# Patient Record
Sex: Female | Born: 1950
Health system: Southern US, Community
[De-identification: ages and names within clinical notes are randomized; demographics above are authoritative.]

## PROBLEM LIST (undated history)

## (undated) DIAGNOSIS — E039 Hypothyroidism, unspecified: Secondary | ICD-10-CM

## (undated) DIAGNOSIS — M1712 Unilateral primary osteoarthritis, left knee: Secondary | ICD-10-CM

## (undated) DIAGNOSIS — E119 Type 2 diabetes mellitus without complications: Secondary | ICD-10-CM

## (undated) DIAGNOSIS — D62 Acute posthemorrhagic anemia: Secondary | ICD-10-CM

## (undated) DIAGNOSIS — E7800 Pure hypercholesterolemia, unspecified: Secondary | ICD-10-CM

## (undated) DIAGNOSIS — D638 Anemia in other chronic diseases classified elsewhere: Secondary | ICD-10-CM

## (undated) DIAGNOSIS — G4733 Obstructive sleep apnea (adult) (pediatric): Secondary | ICD-10-CM

## (undated) DIAGNOSIS — K7460 Unspecified cirrhosis of liver: Secondary | ICD-10-CM

## (undated) DIAGNOSIS — Z87442 Personal history of urinary calculi: Secondary | ICD-10-CM

## (undated) DIAGNOSIS — H16002 Unspecified corneal ulcer, left eye: Secondary | ICD-10-CM

## (undated) DIAGNOSIS — F419 Anxiety disorder, unspecified: Secondary | ICD-10-CM

## (undated) DIAGNOSIS — K219 Gastro-esophageal reflux disease without esophagitis: Secondary | ICD-10-CM

## (undated) DIAGNOSIS — I10 Essential (primary) hypertension: Secondary | ICD-10-CM

## (undated) HISTORY — DX: Obstructive sleep apnea (adult) (pediatric): G47.33

## (undated) HISTORY — DX: Unilateral primary osteoarthritis, left knee: M17.12

## (undated) HISTORY — DX: Unspecified corneal ulcer, left eye: H16.002

## (undated) HISTORY — DX: Gastro-esophageal reflux disease without esophagitis: K21.9

## (undated) HISTORY — DX: Type 2 diabetes mellitus without complications: E11.9

## (undated) HISTORY — DX: Essential (primary) hypertension: I10

## (undated) HISTORY — DX: Pure hypercholesterolemia, unspecified: E78.00

## (undated) HISTORY — DX: Hypothyroidism, unspecified: E03.9

## (undated) HISTORY — PX: EYE SURGERY: SHX253

## (undated) HISTORY — DX: Anxiety disorder, unspecified: F41.9

---

## 1994-11-04 HISTORY — PX: ABDOMINAL HYSTERECTOMY: SHX81

## 1995-11-05 HISTORY — PX: BLADDER SUSPENSION: SHX72

## 2002-11-04 HISTORY — PX: DACROCYSTORHINOSTOMY W/ JONES TUBE: SHX1436

## 2002-11-04 HISTORY — PX: CATARACT EXTRACTION W/ INTRAOCULAR LENS IMPLANT: SHX1309

## 2015-11-07 DIAGNOSIS — H16002 Unspecified corneal ulcer, left eye: Secondary | ICD-10-CM | POA: Diagnosis not present

## 2015-11-07 DIAGNOSIS — E1165 Type 2 diabetes mellitus with hyperglycemia: Secondary | ICD-10-CM | POA: Diagnosis not present

## 2015-11-07 DIAGNOSIS — Z418 Encounter for other procedures for purposes other than remedying health state: Secondary | ICD-10-CM | POA: Diagnosis not present

## 2015-11-07 DIAGNOSIS — E7800 Pure hypercholesterolemia, unspecified: Secondary | ICD-10-CM | POA: Diagnosis not present

## 2015-11-07 DIAGNOSIS — I10 Essential (primary) hypertension: Secondary | ICD-10-CM | POA: Diagnosis not present

## 2015-11-07 DIAGNOSIS — Z6830 Body mass index (BMI) 30.0-30.9, adult: Secondary | ICD-10-CM | POA: Diagnosis not present

## 2015-11-14 DIAGNOSIS — G4733 Obstructive sleep apnea (adult) (pediatric): Secondary | ICD-10-CM | POA: Diagnosis not present

## 2015-11-14 DIAGNOSIS — E7800 Pure hypercholesterolemia, unspecified: Secondary | ICD-10-CM | POA: Diagnosis not present

## 2015-11-14 DIAGNOSIS — E1165 Type 2 diabetes mellitus with hyperglycemia: Secondary | ICD-10-CM | POA: Diagnosis not present

## 2015-11-14 DIAGNOSIS — Z789 Other specified health status: Secondary | ICD-10-CM | POA: Diagnosis not present

## 2016-01-19 DIAGNOSIS — E1165 Type 2 diabetes mellitus with hyperglycemia: Secondary | ICD-10-CM | POA: Diagnosis not present

## 2016-01-19 DIAGNOSIS — E039 Hypothyroidism, unspecified: Secondary | ICD-10-CM | POA: Diagnosis not present

## 2016-01-19 DIAGNOSIS — Z1211 Encounter for screening for malignant neoplasm of colon: Secondary | ICD-10-CM | POA: Diagnosis not present

## 2016-01-19 DIAGNOSIS — Z299 Encounter for prophylactic measures, unspecified: Secondary | ICD-10-CM | POA: Diagnosis not present

## 2016-01-19 DIAGNOSIS — Z0000 Encounter for general adult medical examination without abnormal findings: Secondary | ICD-10-CM | POA: Diagnosis not present

## 2016-01-19 DIAGNOSIS — Z1389 Encounter for screening for other disorder: Secondary | ICD-10-CM | POA: Diagnosis not present

## 2016-01-19 DIAGNOSIS — Z6829 Body mass index (BMI) 29.0-29.9, adult: Secondary | ICD-10-CM | POA: Diagnosis not present

## 2016-01-19 DIAGNOSIS — Z7189 Other specified counseling: Secondary | ICD-10-CM | POA: Diagnosis not present

## 2016-01-19 DIAGNOSIS — E7800 Pure hypercholesterolemia, unspecified: Secondary | ICD-10-CM | POA: Diagnosis not present

## 2016-01-19 DIAGNOSIS — Z79899 Other long term (current) drug therapy: Secondary | ICD-10-CM | POA: Diagnosis not present

## 2016-02-05 DIAGNOSIS — Z1231 Encounter for screening mammogram for malignant neoplasm of breast: Secondary | ICD-10-CM | POA: Diagnosis not present

## 2016-02-06 DIAGNOSIS — E2839 Other primary ovarian failure: Secondary | ICD-10-CM | POA: Diagnosis not present

## 2016-02-19 DIAGNOSIS — M8580 Other specified disorders of bone density and structure, unspecified site: Secondary | ICD-10-CM | POA: Diagnosis not present

## 2016-02-19 DIAGNOSIS — E1165 Type 2 diabetes mellitus with hyperglycemia: Secondary | ICD-10-CM | POA: Diagnosis not present

## 2016-02-19 DIAGNOSIS — E7800 Pure hypercholesterolemia, unspecified: Secondary | ICD-10-CM | POA: Diagnosis not present

## 2016-02-19 DIAGNOSIS — I10 Essential (primary) hypertension: Secondary | ICD-10-CM | POA: Diagnosis not present

## 2016-02-27 DIAGNOSIS — I10 Essential (primary) hypertension: Secondary | ICD-10-CM | POA: Diagnosis not present

## 2016-02-27 DIAGNOSIS — E119 Type 2 diabetes mellitus without complications: Secondary | ICD-10-CM | POA: Diagnosis not present

## 2016-02-27 DIAGNOSIS — E7800 Pure hypercholesterolemia, unspecified: Secondary | ICD-10-CM | POA: Diagnosis not present

## 2016-03-15 DIAGNOSIS — E7800 Pure hypercholesterolemia, unspecified: Secondary | ICD-10-CM | POA: Diagnosis not present

## 2016-03-15 DIAGNOSIS — E119 Type 2 diabetes mellitus without complications: Secondary | ICD-10-CM | POA: Diagnosis not present

## 2016-03-15 DIAGNOSIS — I10 Essential (primary) hypertension: Secondary | ICD-10-CM | POA: Diagnosis not present

## 2016-04-02 DIAGNOSIS — Z789 Other specified health status: Secondary | ICD-10-CM | POA: Diagnosis not present

## 2016-04-02 DIAGNOSIS — Z299 Encounter for prophylactic measures, unspecified: Secondary | ICD-10-CM | POA: Diagnosis not present

## 2016-04-02 DIAGNOSIS — J069 Acute upper respiratory infection, unspecified: Secondary | ICD-10-CM | POA: Diagnosis not present

## 2016-04-24 DIAGNOSIS — E7800 Pure hypercholesterolemia, unspecified: Secondary | ICD-10-CM | POA: Diagnosis not present

## 2016-04-24 DIAGNOSIS — E119 Type 2 diabetes mellitus without complications: Secondary | ICD-10-CM | POA: Diagnosis not present

## 2016-04-24 DIAGNOSIS — I10 Essential (primary) hypertension: Secondary | ICD-10-CM | POA: Diagnosis not present

## 2016-04-26 DIAGNOSIS — E1165 Type 2 diabetes mellitus with hyperglycemia: Secondary | ICD-10-CM | POA: Diagnosis not present

## 2016-05-15 DIAGNOSIS — E7800 Pure hypercholesterolemia, unspecified: Secondary | ICD-10-CM | POA: Diagnosis not present

## 2016-05-15 DIAGNOSIS — I10 Essential (primary) hypertension: Secondary | ICD-10-CM | POA: Diagnosis not present

## 2016-05-15 DIAGNOSIS — E119 Type 2 diabetes mellitus without complications: Secondary | ICD-10-CM | POA: Diagnosis not present

## 2016-05-29 DIAGNOSIS — E119 Type 2 diabetes mellitus without complications: Secondary | ICD-10-CM | POA: Diagnosis not present

## 2016-05-29 DIAGNOSIS — H353131 Nonexudative age-related macular degeneration, bilateral, early dry stage: Secondary | ICD-10-CM | POA: Diagnosis not present

## 2016-05-29 DIAGNOSIS — H2511 Age-related nuclear cataract, right eye: Secondary | ICD-10-CM | POA: Diagnosis not present

## 2016-07-12 DIAGNOSIS — E7800 Pure hypercholesterolemia, unspecified: Secondary | ICD-10-CM | POA: Diagnosis not present

## 2016-07-12 DIAGNOSIS — E039 Hypothyroidism, unspecified: Secondary | ICD-10-CM | POA: Diagnosis not present

## 2016-07-12 DIAGNOSIS — I10 Essential (primary) hypertension: Secondary | ICD-10-CM | POA: Diagnosis not present

## 2016-07-12 DIAGNOSIS — E1165 Type 2 diabetes mellitus with hyperglycemia: Secondary | ICD-10-CM | POA: Diagnosis not present

## 2016-07-24 DIAGNOSIS — L820 Inflamed seborrheic keratosis: Secondary | ICD-10-CM | POA: Diagnosis not present

## 2016-07-24 DIAGNOSIS — L814 Other melanin hyperpigmentation: Secondary | ICD-10-CM | POA: Diagnosis not present

## 2016-07-24 DIAGNOSIS — L570 Actinic keratosis: Secondary | ICD-10-CM | POA: Diagnosis not present

## 2016-07-24 DIAGNOSIS — X32XXXA Exposure to sunlight, initial encounter: Secondary | ICD-10-CM | POA: Diagnosis not present

## 2016-07-24 DIAGNOSIS — D225 Melanocytic nevi of trunk: Secondary | ICD-10-CM | POA: Diagnosis not present

## 2016-07-24 DIAGNOSIS — Z1283 Encounter for screening for malignant neoplasm of skin: Secondary | ICD-10-CM | POA: Diagnosis not present

## 2016-07-24 DIAGNOSIS — C44519 Basal cell carcinoma of skin of other part of trunk: Secondary | ICD-10-CM | POA: Diagnosis not present

## 2016-07-24 DIAGNOSIS — D1801 Hemangioma of skin and subcutaneous tissue: Secondary | ICD-10-CM | POA: Diagnosis not present

## 2016-08-05 DIAGNOSIS — Z23 Encounter for immunization: Secondary | ICD-10-CM | POA: Diagnosis not present

## 2016-08-05 DIAGNOSIS — E1165 Type 2 diabetes mellitus with hyperglycemia: Secondary | ICD-10-CM | POA: Diagnosis not present

## 2016-08-05 DIAGNOSIS — R3911 Hesitancy of micturition: Secondary | ICD-10-CM | POA: Diagnosis not present

## 2016-08-05 DIAGNOSIS — H8110 Benign paroxysmal vertigo, unspecified ear: Secondary | ICD-10-CM | POA: Diagnosis not present

## 2016-08-05 DIAGNOSIS — Z6828 Body mass index (BMI) 28.0-28.9, adult: Secondary | ICD-10-CM | POA: Diagnosis not present

## 2016-08-05 DIAGNOSIS — Z299 Encounter for prophylactic measures, unspecified: Secondary | ICD-10-CM | POA: Diagnosis not present

## 2016-08-07 DIAGNOSIS — E119 Type 2 diabetes mellitus without complications: Secondary | ICD-10-CM | POA: Diagnosis not present

## 2016-08-07 DIAGNOSIS — E7800 Pure hypercholesterolemia, unspecified: Secondary | ICD-10-CM | POA: Diagnosis not present

## 2016-08-07 DIAGNOSIS — I10 Essential (primary) hypertension: Secondary | ICD-10-CM | POA: Diagnosis not present

## 2016-08-26 DIAGNOSIS — Z08 Encounter for follow-up examination after completed treatment for malignant neoplasm: Secondary | ICD-10-CM | POA: Diagnosis not present

## 2016-08-26 DIAGNOSIS — Z85828 Personal history of other malignant neoplasm of skin: Secondary | ICD-10-CM | POA: Diagnosis not present

## 2016-09-19 DIAGNOSIS — H16002 Unspecified corneal ulcer, left eye: Secondary | ICD-10-CM | POA: Diagnosis not present

## 2016-09-19 DIAGNOSIS — R350 Frequency of micturition: Secondary | ICD-10-CM | POA: Diagnosis not present

## 2016-09-19 DIAGNOSIS — Z299 Encounter for prophylactic measures, unspecified: Secondary | ICD-10-CM | POA: Diagnosis not present

## 2016-09-19 DIAGNOSIS — N390 Urinary tract infection, site not specified: Secondary | ICD-10-CM | POA: Diagnosis not present

## 2016-10-07 DIAGNOSIS — I10 Essential (primary) hypertension: Secondary | ICD-10-CM | POA: Diagnosis not present

## 2016-10-07 DIAGNOSIS — E119 Type 2 diabetes mellitus without complications: Secondary | ICD-10-CM | POA: Diagnosis not present

## 2016-10-07 DIAGNOSIS — E7800 Pure hypercholesterolemia, unspecified: Secondary | ICD-10-CM | POA: Diagnosis not present

## 2016-10-08 DIAGNOSIS — Z299 Encounter for prophylactic measures, unspecified: Secondary | ICD-10-CM | POA: Diagnosis not present

## 2016-10-08 DIAGNOSIS — Z6828 Body mass index (BMI) 28.0-28.9, adult: Secondary | ICD-10-CM | POA: Diagnosis not present

## 2016-10-08 DIAGNOSIS — I10 Essential (primary) hypertension: Secondary | ICD-10-CM | POA: Diagnosis not present

## 2016-10-08 DIAGNOSIS — Z713 Dietary counseling and surveillance: Secondary | ICD-10-CM | POA: Diagnosis not present

## 2016-11-05 DIAGNOSIS — Z713 Dietary counseling and surveillance: Secondary | ICD-10-CM | POA: Diagnosis not present

## 2016-11-05 DIAGNOSIS — I10 Essential (primary) hypertension: Secondary | ICD-10-CM | POA: Diagnosis not present

## 2016-11-05 DIAGNOSIS — Z6829 Body mass index (BMI) 29.0-29.9, adult: Secondary | ICD-10-CM | POA: Diagnosis not present

## 2016-11-05 DIAGNOSIS — Z299 Encounter for prophylactic measures, unspecified: Secondary | ICD-10-CM | POA: Diagnosis not present

## 2016-11-05 DIAGNOSIS — J069 Acute upper respiratory infection, unspecified: Secondary | ICD-10-CM | POA: Diagnosis not present

## 2016-11-11 DIAGNOSIS — Z789 Other specified health status: Secondary | ICD-10-CM | POA: Diagnosis not present

## 2016-11-11 DIAGNOSIS — Z6828 Body mass index (BMI) 28.0-28.9, adult: Secondary | ICD-10-CM | POA: Diagnosis not present

## 2016-11-11 DIAGNOSIS — R509 Fever, unspecified: Secondary | ICD-10-CM | POA: Diagnosis not present

## 2016-11-11 DIAGNOSIS — J111 Influenza due to unidentified influenza virus with other respiratory manifestations: Secondary | ICD-10-CM | POA: Diagnosis not present

## 2016-11-11 DIAGNOSIS — Z299 Encounter for prophylactic measures, unspecified: Secondary | ICD-10-CM | POA: Diagnosis not present

## 2016-11-15 DIAGNOSIS — E119 Type 2 diabetes mellitus without complications: Secondary | ICD-10-CM | POA: Diagnosis not present

## 2016-11-15 DIAGNOSIS — I10 Essential (primary) hypertension: Secondary | ICD-10-CM | POA: Diagnosis not present

## 2016-11-15 DIAGNOSIS — E7800 Pure hypercholesterolemia, unspecified: Secondary | ICD-10-CM | POA: Diagnosis not present

## 2016-11-18 DIAGNOSIS — R05 Cough: Secondary | ICD-10-CM | POA: Diagnosis not present

## 2016-11-18 DIAGNOSIS — Z713 Dietary counseling and surveillance: Secondary | ICD-10-CM | POA: Diagnosis not present

## 2016-11-18 DIAGNOSIS — E1165 Type 2 diabetes mellitus with hyperglycemia: Secondary | ICD-10-CM | POA: Diagnosis not present

## 2016-11-18 DIAGNOSIS — Z6828 Body mass index (BMI) 28.0-28.9, adult: Secondary | ICD-10-CM | POA: Diagnosis not present

## 2016-11-18 DIAGNOSIS — R5383 Other fatigue: Secondary | ICD-10-CM | POA: Diagnosis not present

## 2016-11-18 DIAGNOSIS — R509 Fever, unspecified: Secondary | ICD-10-CM | POA: Diagnosis not present

## 2016-11-18 DIAGNOSIS — Z299 Encounter for prophylactic measures, unspecified: Secondary | ICD-10-CM | POA: Diagnosis not present

## 2016-11-18 DIAGNOSIS — J069 Acute upper respiratory infection, unspecified: Secondary | ICD-10-CM | POA: Diagnosis not present

## 2016-11-18 DIAGNOSIS — E7800 Pure hypercholesterolemia, unspecified: Secondary | ICD-10-CM | POA: Diagnosis not present

## 2016-12-03 DIAGNOSIS — I10 Essential (primary) hypertension: Secondary | ICD-10-CM | POA: Diagnosis not present

## 2016-12-03 DIAGNOSIS — E1165 Type 2 diabetes mellitus with hyperglycemia: Secondary | ICD-10-CM | POA: Diagnosis not present

## 2016-12-03 DIAGNOSIS — Z299 Encounter for prophylactic measures, unspecified: Secondary | ICD-10-CM | POA: Diagnosis not present

## 2016-12-03 DIAGNOSIS — K219 Gastro-esophageal reflux disease without esophagitis: Secondary | ICD-10-CM | POA: Diagnosis not present

## 2016-12-03 DIAGNOSIS — E039 Hypothyroidism, unspecified: Secondary | ICD-10-CM | POA: Diagnosis not present

## 2016-12-03 DIAGNOSIS — Z6828 Body mass index (BMI) 28.0-28.9, adult: Secondary | ICD-10-CM | POA: Diagnosis not present

## 2016-12-03 DIAGNOSIS — E7800 Pure hypercholesterolemia, unspecified: Secondary | ICD-10-CM | POA: Diagnosis not present

## 2016-12-03 DIAGNOSIS — Z789 Other specified health status: Secondary | ICD-10-CM | POA: Diagnosis not present

## 2016-12-03 DIAGNOSIS — Z713 Dietary counseling and surveillance: Secondary | ICD-10-CM | POA: Diagnosis not present

## 2016-12-11 DIAGNOSIS — E119 Type 2 diabetes mellitus without complications: Secondary | ICD-10-CM | POA: Diagnosis not present

## 2016-12-11 DIAGNOSIS — I10 Essential (primary) hypertension: Secondary | ICD-10-CM | POA: Diagnosis not present

## 2016-12-11 DIAGNOSIS — E7800 Pure hypercholesterolemia, unspecified: Secondary | ICD-10-CM | POA: Diagnosis not present

## 2016-12-12 DIAGNOSIS — B0052 Herpesviral keratitis: Secondary | ICD-10-CM | POA: Diagnosis not present

## 2016-12-26 DIAGNOSIS — B0052 Herpesviral keratitis: Secondary | ICD-10-CM | POA: Diagnosis not present

## 2017-01-09 DIAGNOSIS — I10 Essential (primary) hypertension: Secondary | ICD-10-CM | POA: Diagnosis not present

## 2017-01-09 DIAGNOSIS — E119 Type 2 diabetes mellitus without complications: Secondary | ICD-10-CM | POA: Diagnosis not present

## 2017-01-09 DIAGNOSIS — E7800 Pure hypercholesterolemia, unspecified: Secondary | ICD-10-CM | POA: Diagnosis not present

## 2017-01-20 DIAGNOSIS — Z1211 Encounter for screening for malignant neoplasm of colon: Secondary | ICD-10-CM | POA: Diagnosis not present

## 2017-01-20 DIAGNOSIS — E039 Hypothyroidism, unspecified: Secondary | ICD-10-CM | POA: Diagnosis not present

## 2017-01-20 DIAGNOSIS — Z7189 Other specified counseling: Secondary | ICD-10-CM | POA: Diagnosis not present

## 2017-01-20 DIAGNOSIS — R5383 Other fatigue: Secondary | ICD-10-CM | POA: Diagnosis not present

## 2017-01-20 DIAGNOSIS — I10 Essential (primary) hypertension: Secondary | ICD-10-CM | POA: Diagnosis not present

## 2017-01-20 DIAGNOSIS — Z0000 Encounter for general adult medical examination without abnormal findings: Secondary | ICD-10-CM | POA: Diagnosis not present

## 2017-01-20 DIAGNOSIS — Z1389 Encounter for screening for other disorder: Secondary | ICD-10-CM | POA: Diagnosis not present

## 2017-01-20 DIAGNOSIS — Z79899 Other long term (current) drug therapy: Secondary | ICD-10-CM | POA: Diagnosis not present

## 2017-01-20 DIAGNOSIS — F419 Anxiety disorder, unspecified: Secondary | ICD-10-CM | POA: Diagnosis not present

## 2017-01-20 DIAGNOSIS — Z6828 Body mass index (BMI) 28.0-28.9, adult: Secondary | ICD-10-CM | POA: Diagnosis not present

## 2017-01-20 DIAGNOSIS — Z299 Encounter for prophylactic measures, unspecified: Secondary | ICD-10-CM | POA: Diagnosis not present

## 2017-01-20 DIAGNOSIS — E1165 Type 2 diabetes mellitus with hyperglycemia: Secondary | ICD-10-CM | POA: Diagnosis not present

## 2017-01-20 DIAGNOSIS — E7800 Pure hypercholesterolemia, unspecified: Secondary | ICD-10-CM | POA: Diagnosis not present

## 2017-01-23 DIAGNOSIS — H35132 Retinopathy of prematurity, stage 2, left eye: Secondary | ICD-10-CM | POA: Diagnosis not present

## 2017-01-23 DIAGNOSIS — B0052 Herpesviral keratitis: Secondary | ICD-10-CM | POA: Diagnosis not present

## 2017-02-07 DIAGNOSIS — Z1231 Encounter for screening mammogram for malignant neoplasm of breast: Secondary | ICD-10-CM | POA: Diagnosis not present

## 2017-02-14 DIAGNOSIS — I10 Essential (primary) hypertension: Secondary | ICD-10-CM | POA: Diagnosis not present

## 2017-02-14 DIAGNOSIS — H1712 Central corneal opacity, left eye: Secondary | ICD-10-CM | POA: Diagnosis not present

## 2017-02-14 DIAGNOSIS — E119 Type 2 diabetes mellitus without complications: Secondary | ICD-10-CM | POA: Diagnosis not present

## 2017-02-14 DIAGNOSIS — Z7984 Long term (current) use of oral hypoglycemic drugs: Secondary | ICD-10-CM | POA: Diagnosis not present

## 2017-02-14 DIAGNOSIS — H2511 Age-related nuclear cataract, right eye: Secondary | ICD-10-CM | POA: Diagnosis not present

## 2017-02-14 DIAGNOSIS — H35033 Hypertensive retinopathy, bilateral: Secondary | ICD-10-CM | POA: Diagnosis not present

## 2017-02-14 DIAGNOSIS — Z961 Presence of intraocular lens: Secondary | ICD-10-CM | POA: Diagnosis not present

## 2017-02-14 DIAGNOSIS — Z9842 Cataract extraction status, left eye: Secondary | ICD-10-CM | POA: Diagnosis not present

## 2017-02-14 DIAGNOSIS — H1045 Other chronic allergic conjunctivitis: Secondary | ICD-10-CM | POA: Diagnosis not present

## 2017-02-14 DIAGNOSIS — H524 Presbyopia: Secondary | ICD-10-CM | POA: Diagnosis not present

## 2017-02-19 DIAGNOSIS — E119 Type 2 diabetes mellitus without complications: Secondary | ICD-10-CM | POA: Diagnosis not present

## 2017-02-19 DIAGNOSIS — I10 Essential (primary) hypertension: Secondary | ICD-10-CM | POA: Diagnosis not present

## 2017-02-19 DIAGNOSIS — N6342 Unspecified lump in left breast, subareolar: Secondary | ICD-10-CM | POA: Diagnosis not present

## 2017-02-19 DIAGNOSIS — R921 Mammographic calcification found on diagnostic imaging of breast: Secondary | ICD-10-CM | POA: Diagnosis not present

## 2017-02-19 DIAGNOSIS — N6002 Solitary cyst of left breast: Secondary | ICD-10-CM | POA: Diagnosis not present

## 2017-02-19 DIAGNOSIS — E7800 Pure hypercholesterolemia, unspecified: Secondary | ICD-10-CM | POA: Diagnosis not present

## 2017-02-20 DIAGNOSIS — Z1283 Encounter for screening for malignant neoplasm of skin: Secondary | ICD-10-CM | POA: Diagnosis not present

## 2017-02-20 DIAGNOSIS — D225 Melanocytic nevi of trunk: Secondary | ICD-10-CM | POA: Diagnosis not present

## 2017-02-20 DIAGNOSIS — I781 Nevus, non-neoplastic: Secondary | ICD-10-CM | POA: Diagnosis not present

## 2017-02-20 DIAGNOSIS — X32XXXD Exposure to sunlight, subsequent encounter: Secondary | ICD-10-CM | POA: Diagnosis not present

## 2017-02-20 DIAGNOSIS — L570 Actinic keratosis: Secondary | ICD-10-CM | POA: Diagnosis not present

## 2017-03-05 DIAGNOSIS — E7800 Pure hypercholesterolemia, unspecified: Secondary | ICD-10-CM | POA: Diagnosis not present

## 2017-03-05 DIAGNOSIS — E119 Type 2 diabetes mellitus without complications: Secondary | ICD-10-CM | POA: Diagnosis not present

## 2017-03-05 DIAGNOSIS — I10 Essential (primary) hypertension: Secondary | ICD-10-CM | POA: Diagnosis not present

## 2017-03-10 DIAGNOSIS — E039 Hypothyroidism, unspecified: Secondary | ICD-10-CM | POA: Diagnosis not present

## 2017-03-10 DIAGNOSIS — E7800 Pure hypercholesterolemia, unspecified: Secondary | ICD-10-CM | POA: Diagnosis not present

## 2017-03-10 DIAGNOSIS — E1165 Type 2 diabetes mellitus with hyperglycemia: Secondary | ICD-10-CM | POA: Diagnosis not present

## 2017-03-10 DIAGNOSIS — Z6828 Body mass index (BMI) 28.0-28.9, adult: Secondary | ICD-10-CM | POA: Diagnosis not present

## 2017-03-10 DIAGNOSIS — K219 Gastro-esophageal reflux disease without esophagitis: Secondary | ICD-10-CM | POA: Diagnosis not present

## 2017-03-10 DIAGNOSIS — I10 Essential (primary) hypertension: Secondary | ICD-10-CM | POA: Diagnosis not present

## 2017-03-10 DIAGNOSIS — F419 Anxiety disorder, unspecified: Secondary | ICD-10-CM | POA: Diagnosis not present

## 2017-03-10 DIAGNOSIS — M8580 Other specified disorders of bone density and structure, unspecified site: Secondary | ICD-10-CM | POA: Diagnosis not present

## 2017-03-10 DIAGNOSIS — Z299 Encounter for prophylactic measures, unspecified: Secondary | ICD-10-CM | POA: Diagnosis not present

## 2017-03-10 DIAGNOSIS — H6690 Otitis media, unspecified, unspecified ear: Secondary | ICD-10-CM | POA: Diagnosis not present

## 2017-03-10 DIAGNOSIS — J302 Other seasonal allergic rhinitis: Secondary | ICD-10-CM | POA: Diagnosis not present

## 2017-04-24 DIAGNOSIS — E1165 Type 2 diabetes mellitus with hyperglycemia: Secondary | ICD-10-CM | POA: Diagnosis not present

## 2017-04-24 DIAGNOSIS — E039 Hypothyroidism, unspecified: Secondary | ICD-10-CM | POA: Diagnosis not present

## 2017-04-24 DIAGNOSIS — M7050 Other bursitis of knee, unspecified knee: Secondary | ICD-10-CM | POA: Diagnosis not present

## 2017-04-24 DIAGNOSIS — E7800 Pure hypercholesterolemia, unspecified: Secondary | ICD-10-CM | POA: Diagnosis not present

## 2017-04-24 DIAGNOSIS — Z299 Encounter for prophylactic measures, unspecified: Secondary | ICD-10-CM | POA: Diagnosis not present

## 2017-04-24 DIAGNOSIS — I10 Essential (primary) hypertension: Secondary | ICD-10-CM | POA: Diagnosis not present

## 2017-04-24 DIAGNOSIS — K219 Gastro-esophageal reflux disease without esophagitis: Secondary | ICD-10-CM | POA: Diagnosis not present

## 2017-04-24 DIAGNOSIS — M1712 Unilateral primary osteoarthritis, left knee: Secondary | ICD-10-CM | POA: Diagnosis not present

## 2017-04-24 DIAGNOSIS — Z6828 Body mass index (BMI) 28.0-28.9, adult: Secondary | ICD-10-CM | POA: Diagnosis not present

## 2017-04-24 DIAGNOSIS — G4733 Obstructive sleep apnea (adult) (pediatric): Secondary | ICD-10-CM | POA: Diagnosis not present

## 2017-05-01 DIAGNOSIS — B0052 Herpesviral keratitis: Secondary | ICD-10-CM | POA: Diagnosis not present

## 2017-05-01 DIAGNOSIS — H353132 Nonexudative age-related macular degeneration, bilateral, intermediate dry stage: Secondary | ICD-10-CM | POA: Diagnosis not present

## 2017-05-01 DIAGNOSIS — Z961 Presence of intraocular lens: Secondary | ICD-10-CM | POA: Diagnosis not present

## 2017-05-19 DIAGNOSIS — Z299 Encounter for prophylactic measures, unspecified: Secondary | ICD-10-CM | POA: Diagnosis not present

## 2017-05-19 DIAGNOSIS — M1712 Unilateral primary osteoarthritis, left knee: Secondary | ICD-10-CM | POA: Diagnosis not present

## 2017-05-19 DIAGNOSIS — Z6828 Body mass index (BMI) 28.0-28.9, adult: Secondary | ICD-10-CM | POA: Diagnosis not present

## 2017-05-22 DIAGNOSIS — S8002XA Contusion of left knee, initial encounter: Secondary | ICD-10-CM | POA: Diagnosis not present

## 2017-05-22 DIAGNOSIS — M1712 Unilateral primary osteoarthritis, left knee: Secondary | ICD-10-CM | POA: Diagnosis not present

## 2017-05-22 DIAGNOSIS — M810 Age-related osteoporosis without current pathological fracture: Secondary | ICD-10-CM | POA: Diagnosis not present

## 2017-06-03 DIAGNOSIS — M1711 Unilateral primary osteoarthritis, right knee: Secondary | ICD-10-CM | POA: Diagnosis not present

## 2017-06-03 DIAGNOSIS — S83242D Other tear of medial meniscus, current injury, left knee, subsequent encounter: Secondary | ICD-10-CM | POA: Diagnosis not present

## 2017-06-09 DIAGNOSIS — M25562 Pain in left knee: Secondary | ICD-10-CM | POA: Diagnosis not present

## 2017-06-09 DIAGNOSIS — S83262A Peripheral tear of lateral meniscus, current injury, left knee, initial encounter: Secondary | ICD-10-CM | POA: Diagnosis not present

## 2017-06-09 DIAGNOSIS — M1712 Unilateral primary osteoarthritis, left knee: Secondary | ICD-10-CM | POA: Diagnosis not present

## 2017-06-09 DIAGNOSIS — M23304 Other meniscus derangements, unspecified medial meniscus, left knee: Secondary | ICD-10-CM | POA: Diagnosis not present

## 2017-06-10 DIAGNOSIS — M1711 Unilateral primary osteoarthritis, right knee: Secondary | ICD-10-CM | POA: Diagnosis not present

## 2017-06-17 DIAGNOSIS — M1712 Unilateral primary osteoarthritis, left knee: Secondary | ICD-10-CM | POA: Diagnosis not present

## 2017-06-18 DIAGNOSIS — M79671 Pain in right foot: Secondary | ICD-10-CM | POA: Diagnosis not present

## 2017-06-18 DIAGNOSIS — I10 Essential (primary) hypertension: Secondary | ICD-10-CM | POA: Diagnosis not present

## 2017-06-18 DIAGNOSIS — M79672 Pain in left foot: Secondary | ICD-10-CM | POA: Diagnosis not present

## 2017-06-18 DIAGNOSIS — M8580 Other specified disorders of bone density and structure, unspecified site: Secondary | ICD-10-CM | POA: Diagnosis not present

## 2017-06-18 DIAGNOSIS — Z6828 Body mass index (BMI) 28.0-28.9, adult: Secondary | ICD-10-CM | POA: Diagnosis not present

## 2017-06-18 DIAGNOSIS — G4733 Obstructive sleep apnea (adult) (pediatric): Secondary | ICD-10-CM | POA: Diagnosis not present

## 2017-06-18 DIAGNOSIS — F419 Anxiety disorder, unspecified: Secondary | ICD-10-CM | POA: Diagnosis not present

## 2017-06-18 DIAGNOSIS — E1165 Type 2 diabetes mellitus with hyperglycemia: Secondary | ICD-10-CM | POA: Diagnosis not present

## 2017-06-18 DIAGNOSIS — Z299 Encounter for prophylactic measures, unspecified: Secondary | ICD-10-CM | POA: Diagnosis not present

## 2017-06-18 DIAGNOSIS — M1712 Unilateral primary osteoarthritis, left knee: Secondary | ICD-10-CM | POA: Diagnosis not present

## 2017-06-18 DIAGNOSIS — E039 Hypothyroidism, unspecified: Secondary | ICD-10-CM | POA: Diagnosis not present

## 2017-06-24 DIAGNOSIS — M1711 Unilateral primary osteoarthritis, right knee: Secondary | ICD-10-CM | POA: Diagnosis not present

## 2017-07-01 DIAGNOSIS — I10 Essential (primary) hypertension: Secondary | ICD-10-CM | POA: Diagnosis not present

## 2017-07-01 DIAGNOSIS — E7800 Pure hypercholesterolemia, unspecified: Secondary | ICD-10-CM | POA: Diagnosis not present

## 2017-07-01 DIAGNOSIS — E119 Type 2 diabetes mellitus without complications: Secondary | ICD-10-CM | POA: Diagnosis not present

## 2017-07-02 ENCOUNTER — Other Ambulatory Visit: Payer: Self-pay

## 2017-07-02 DIAGNOSIS — R0989 Other specified symptoms and signs involving the circulatory and respiratory systems: Secondary | ICD-10-CM

## 2017-07-08 DIAGNOSIS — M1711 Unilateral primary osteoarthritis, right knee: Secondary | ICD-10-CM | POA: Diagnosis not present

## 2017-07-16 DIAGNOSIS — I10 Essential (primary) hypertension: Secondary | ICD-10-CM | POA: Diagnosis not present

## 2017-07-16 DIAGNOSIS — E7800 Pure hypercholesterolemia, unspecified: Secondary | ICD-10-CM | POA: Diagnosis not present

## 2017-07-16 DIAGNOSIS — E119 Type 2 diabetes mellitus without complications: Secondary | ICD-10-CM | POA: Diagnosis not present

## 2017-07-17 ENCOUNTER — Encounter: Payer: Self-pay | Admitting: Vascular Surgery

## 2017-07-23 DIAGNOSIS — Z6828 Body mass index (BMI) 28.0-28.9, adult: Secondary | ICD-10-CM | POA: Diagnosis not present

## 2017-07-23 DIAGNOSIS — F419 Anxiety disorder, unspecified: Secondary | ICD-10-CM | POA: Diagnosis not present

## 2017-07-23 DIAGNOSIS — E039 Hypothyroidism, unspecified: Secondary | ICD-10-CM | POA: Diagnosis not present

## 2017-07-23 DIAGNOSIS — E1165 Type 2 diabetes mellitus with hyperglycemia: Secondary | ICD-10-CM | POA: Diagnosis not present

## 2017-07-23 DIAGNOSIS — G4733 Obstructive sleep apnea (adult) (pediatric): Secondary | ICD-10-CM | POA: Diagnosis not present

## 2017-07-23 DIAGNOSIS — M1712 Unilateral primary osteoarthritis, left knee: Secondary | ICD-10-CM | POA: Diagnosis not present

## 2017-07-23 DIAGNOSIS — E7800 Pure hypercholesterolemia, unspecified: Secondary | ICD-10-CM | POA: Diagnosis not present

## 2017-07-23 DIAGNOSIS — I10 Essential (primary) hypertension: Secondary | ICD-10-CM | POA: Diagnosis not present

## 2017-07-23 DIAGNOSIS — Z299 Encounter for prophylactic measures, unspecified: Secondary | ICD-10-CM | POA: Diagnosis not present

## 2017-07-25 DIAGNOSIS — Z23 Encounter for immunization: Secondary | ICD-10-CM | POA: Diagnosis not present

## 2017-07-28 DIAGNOSIS — Z713 Dietary counseling and surveillance: Secondary | ICD-10-CM | POA: Diagnosis not present

## 2017-07-28 DIAGNOSIS — I10 Essential (primary) hypertension: Secondary | ICD-10-CM | POA: Diagnosis not present

## 2017-07-28 DIAGNOSIS — Z299 Encounter for prophylactic measures, unspecified: Secondary | ICD-10-CM | POA: Diagnosis not present

## 2017-07-28 DIAGNOSIS — N390 Urinary tract infection, site not specified: Secondary | ICD-10-CM | POA: Diagnosis not present

## 2017-07-28 DIAGNOSIS — G4733 Obstructive sleep apnea (adult) (pediatric): Secondary | ICD-10-CM | POA: Diagnosis not present

## 2017-07-28 DIAGNOSIS — Z6828 Body mass index (BMI) 28.0-28.9, adult: Secondary | ICD-10-CM | POA: Diagnosis not present

## 2017-07-28 DIAGNOSIS — R350 Frequency of micturition: Secondary | ICD-10-CM | POA: Diagnosis not present

## 2017-08-01 ENCOUNTER — Other Ambulatory Visit: Payer: Self-pay | Admitting: Orthopedic Surgery

## 2017-08-05 DIAGNOSIS — Z299 Encounter for prophylactic measures, unspecified: Secondary | ICD-10-CM | POA: Diagnosis not present

## 2017-08-05 DIAGNOSIS — I10 Essential (primary) hypertension: Secondary | ICD-10-CM | POA: Diagnosis not present

## 2017-08-05 DIAGNOSIS — G4733 Obstructive sleep apnea (adult) (pediatric): Secondary | ICD-10-CM | POA: Diagnosis not present

## 2017-08-05 DIAGNOSIS — E039 Hypothyroidism, unspecified: Secondary | ICD-10-CM | POA: Diagnosis not present

## 2017-08-05 DIAGNOSIS — R3915 Urgency of urination: Secondary | ICD-10-CM | POA: Diagnosis not present

## 2017-08-05 DIAGNOSIS — E1165 Type 2 diabetes mellitus with hyperglycemia: Secondary | ICD-10-CM | POA: Diagnosis not present

## 2017-08-05 DIAGNOSIS — E7800 Pure hypercholesterolemia, unspecified: Secondary | ICD-10-CM | POA: Diagnosis not present

## 2017-08-05 DIAGNOSIS — Z6828 Body mass index (BMI) 28.0-28.9, adult: Secondary | ICD-10-CM | POA: Diagnosis not present

## 2017-08-05 DIAGNOSIS — M1712 Unilateral primary osteoarthritis, left knee: Secondary | ICD-10-CM | POA: Diagnosis not present

## 2017-08-05 DIAGNOSIS — F419 Anxiety disorder, unspecified: Secondary | ICD-10-CM | POA: Diagnosis not present

## 2017-08-06 ENCOUNTER — Encounter: Payer: Self-pay | Admitting: Physician Assistant

## 2017-08-06 DIAGNOSIS — G4733 Obstructive sleep apnea (adult) (pediatric): Secondary | ICD-10-CM | POA: Diagnosis present

## 2017-08-06 DIAGNOSIS — M1712 Unilateral primary osteoarthritis, left knee: Secondary | ICD-10-CM | POA: Diagnosis not present

## 2017-08-06 DIAGNOSIS — H16002 Unspecified corneal ulcer, left eye: Secondary | ICD-10-CM | POA: Diagnosis present

## 2017-08-06 DIAGNOSIS — E039 Hypothyroidism, unspecified: Secondary | ICD-10-CM | POA: Diagnosis present

## 2017-08-06 DIAGNOSIS — E119 Type 2 diabetes mellitus without complications: Secondary | ICD-10-CM

## 2017-08-06 DIAGNOSIS — F419 Anxiety disorder, unspecified: Secondary | ICD-10-CM | POA: Diagnosis present

## 2017-08-06 DIAGNOSIS — K219 Gastro-esophageal reflux disease without esophagitis: Secondary | ICD-10-CM | POA: Diagnosis present

## 2017-08-06 DIAGNOSIS — E7800 Pure hypercholesterolemia, unspecified: Secondary | ICD-10-CM | POA: Diagnosis present

## 2017-08-06 DIAGNOSIS — I10 Essential (primary) hypertension: Secondary | ICD-10-CM | POA: Diagnosis present

## 2017-08-06 HISTORY — DX: Unilateral primary osteoarthritis, left knee: M17.12

## 2017-08-06 NOTE — H&P (Signed)
TOTAL KNEE ADMISSION H&P  Patient is being admitted for left total knee arthroplasty.  Subjective:  Chief Complaint:left knee pain.  HPI: Katherine Bell, 66 y.o. female, has a history of pain and functional disability in the left knee due to arthritis and has failed non-surgical conservative treatments for greater than 12 weeks to includeNSAID's and/or analgesics, corticosteriod injections, viscosupplementation injections, flexibility and strengthening excercises, supervised PT with diminished ADL's post treatment, use of assistive devices, weight reduction as appropriate and activity modification.  Onset of symptoms was gradual, starting 10 years ago with gradually worsening course since that time. The patient noted no past surgery on the left knee(s).  Patient currently rates pain in the left knee(s) at 10 out of 10 with activity. Patient has night pain, worsening of pain with activity and weight bearing, pain that interferes with activities of daily living, crepitus and joint swelling.  Patient has evidence of subchondral sclerosis, periarticular osteophytes and joint space narrowing by imaging studies. There is no active infection.  Patient Active Problem List   Diagnosis Date Noted  . Primary localized osteoarthrosis of the knee, left 08/06/2017  . Sleep apnea, obstructive   . Osteoarthritis of left knee   . Hypothyroidism   . Hypertension   . Hypercholesteremia   . GERD (gastroesophageal reflux disease)   . Diabetes mellitus without complication ([place])   . Corneal ulcer, left   . Anxiety    Past Medical History:  Diagnosis Date  . Anxiety   . Corneal ulcer, left   . Diabetes mellitus without complication ([place])   . GERD (gastroesophageal reflux disease)   . Hypercholesteremia   . Hypertension   . Hypothyroidism   . Osteoarthritis of left knee   . Primary localized osteoarthrosis of the knee, left 08/06/2017  . Sleep apnea, obstructive     Past Surgical History:  Procedure  Laterality Date  . ABDOMINAL HYSTERECTOMY  1996  . BLADDER SUSPENSION  1997   3 different procedures  . CATARACT EXTRACTION W/ INTRAOCULAR LENS IMPLANT Left 2004  . DACROCYSTORHINOSTOMY W/ JONES TUBE Left 2004    No current facility-administered medications for this encounter.   Current Outpatient Prescriptions:  .  acyclovir (ZOVIRAX) 400 MG tablet, Take 400 mg by mouth 2 (two) times daily., Disp: , Rfl:  .  aspirin 81 MG chewable tablet, Chew 81 mg by mouth daily. , Disp: , Rfl:  .  atorvastatin (LIPITOR) 40 MG tablet, Take 20 mg by mouth daily. , Disp: , Rfl:  .  Calcium Carbonate (CALCIUM 600 PO), Take 1,200 mg by mouth daily. , Disp: , Rfl:  .  fexofenadine (ALLEGRA) 180 MG tablet, Take 180 mg by mouth daily as needed for allergies. , Disp: , Rfl:  .  glipiZIDE (GLUCOTROL) 5 MG tablet, Take 5 mg by mouth 2 (two) times daily before a meal., Disp: , Rfl:  .  hydrochlorothiazide (HYDRODIURIL) 25 MG tablet, Take 25 mg by mouth daily., Disp: , Rfl:  .  levothyroxine (SYNTHROID, LEVOTHROID) 100 MCG tablet, Take 100 mcg by mouth daily before breakfast., Disp: , Rfl:  .  lisinopril (PRINIVIL,ZESTRIL) 5 MG tablet, Take 5 mg by mouth daily. , Disp: , Rfl:  .  losartan (COZAAR) 25 MG tablet, Take 25 mg by mouth daily. , Disp: , Rfl:  .  metFORMIN (GLUCOPHAGE) 1000 MG tablet, Take 1,000 mg by mouth 2 (two) times daily with a meal., Disp: , Rfl:  .  Multiple Vitamins-Minerals (CENTRUM SILVER ULTRA WOMENS PO), Take 0.5 tablets  by mouth 2 (two) times daily. , Disp: , Rfl:  .  pantoprazole (PROTONIX) 40 MG tablet, Take 40 mg by mouth 2 (two) times daily., Disp: , Rfl:  .  sertraline (ZOLOFT) 100 MG tablet, Take 100 mg by mouth 2 (two) times daily., Disp: , Rfl:  .  VOLTAREN 1 % GEL, Apply 1 application topically 3 (three) times daily., Disp: , Rfl: 5  Allergies  Allergen Reactions  . Penicillins Hives    Has taken keflex without difficulty       . Sulfa Antibiotics Hives    Social History   Substance Use Topics  . Smoking status: Never Smoker  . Smokeless tobacco: Not on file  . Alcohol use No    Family History  Problem Relation Age of Onset  . Suicidality Father   . Diabetes Brother   . Hypertension Brother   . Heart disease Brother      Review of Systems  Constitutional: Negative.   HENT: Negative.   Eyes: Negative.   Respiratory: Negative.   Cardiovascular: Negative.   Gastrointestinal: Negative.   Genitourinary: Negative.   Musculoskeletal: Positive for back pain and joint pain.  Skin: Negative.   Neurological: Negative.   Endo/Heme/Allergies: Negative.   Psychiatric/Behavioral: Negative.     Objective:  Physical Exam  Constitutional: She is oriented to person, place, and time. She appears well-developed and well-nourished.  HENT:  Head: Normocephalic and atraumatic.  Mouth/Throat: Oropharynx is clear and moist.  Eyes: Pupils are equal, round, and reactive to light. Conjunctivae are normal.  Neck: Neck supple.  Cardiovascular: Normal rate and regular rhythm.   Respiratory: Effort normal.  GI: Soft.  Genitourinary:  Genitourinary Comments: Not pertinent to current symptomatology therefore not examined.  Musculoskeletal:  Examination reveals that her knee is relatively locked from a position of 10 degrees of flexion to about 40 degrees of flexion.  Some posteromedial joint line tenderness.  Some positive patellar apprehension.  No warmth or erythema.  1-2+ swelling.   Right knee has range of motion within normal limits minimal pain.  Neurological: She is alert and oriented to person, place, and time.  Skin: Skin is warm and dry.  Psychiatric: She has a normal mood and affect. Her behavior is normal.    Vital signs in last 24 hours: Temp:  [98.2 F (36.8 C)] 98.2 F (36.8 C) (10/03 1500) Pulse Rate:  [97] 97 (10/03 1500) BP: (146)/(82) 146/82 (10/03 1500) SpO2:  [98 %] 98 % (10/03 1500) Weight:  [84.8 kg (187 lb)] 84.8 kg (187 lb) (10/03  1500)  Labs:   Estimated body mass index is 29.29 kg/m as calculated from the following:   Height as of this encounter: 5\' 7"  (1.702 m).   Weight as of this encounter: 84.8 kg (187 lb).   Imaging Review Plain radiographs demonstrate severe degenerative joint disease of the left knee(s). The overall alignment issignificant varus. The bone quality appears to be good for age and reported activity level.  Assessment/Plan:  End stage arthritis, left knee  Principal Problem:   Primary localized osteoarthrosis of the knee, left Active Problems:   Sleep apnea, obstructive   Osteoarthritis of left knee   Hypothyroidism   Hypertension   Hypercholesteremia   GERD (gastroesophageal reflux disease)   Diabetes mellitus without complication (HCC)   Corneal ulcer, left   Anxiety  The patient history, physical examination, clinical judgment of the provider and imaging studies are consistent with end stage degenerative joint disease of the left knee(s)  and total knee arthroplasty is deemed medically necessary. The treatment options including medical management, injection therapy arthroscopy and arthroplasty were discussed at length. The risks and benefits of total knee arthroplasty were presented and reviewed. The risks due to aseptic loosening, infection, stiffness, patella tracking problems, thromboembolic complications and other imponderables were discussed. The patient acknowledged the explanation, agreed to proceed with the plan and consent was signed. Patient is being admitted for inpatient treatment for surgery, pain control, PT, OT, prophylactic antibiotics, VTE prophylaxis, progressive ambulation and ADL's and discharge planning. The patient is planning to be discharged home with home health services

## 2017-08-07 NOTE — Pre-Procedure Instructions (Signed)
Katherine Bell  08/07/2017      [id number] [place] [place] [address] [place] [address] Phone: [phone] Fax: [phone]    Your procedure is scheduled on Monday, August 18, 2017  Report to [facility] Admitting Entrance "A" at 7:45 A.M.  Call this number if you have problems the morning of surgery:  [phone]   Remember:  Do not eat food or drink liquids after midnight.  Take these medicines the morning of surgery with A SIP OF WATER: Acyclovir (ZOVIRAX), Levothyroxine (SYNTHROID, LEVOTHROID), Pantoprazole (PROTONIX), and Sertraline (ZOLOFT). If needed Fexofenadine [facility]) for allergies.  Follow your doctor's instruction for taking Aspirin.  7 days before surgery (Oct. 8) stop taking all Aspirin Products, Vitamins, Fish oils, and Herbal medications. Also stop all NSAIDS i.e. Advil, Motrin, Aleve, Anaprox, Naproxen, BC and Goody Powders.  How to Manage Your Diabetes Before and After Surgery  Why is it important to control my blood sugar before and after surgery? . Improving blood sugar levels before and after surgery helps healing and can limit problems. . A way of improving blood sugar control is eating a healthy diet by: o  Eating less sugar and carbohydrates o  Increasing activity/exercise o  Talking with your doctor about reaching your blood sugar goals . High blood sugars (greater than 180 mg/dL) can raise your risk of infections and slow your recovery, so you will need to focus on controlling your diabetes during the weeks before surgery. . Make sure that the doctor who takes care of your diabetes knows about your planned surgery including the date and location.  How do I manage my blood sugar before surgery? . Check your blood sugar at least 4 times a day, starting 2 days before surgery, to make sure that the level is not too high or low. o Check your blood sugar the morning of  your surgery when you wake up and every 2 hours until you get to the Short Stay unit. . If your blood sugar is less than 70 mg/dL, you will need to treat for low blood sugar: o Do not take insulin. o Treat a low blood sugar (less than 70 mg/dL) with  cup of clear juice (cranberry or apple), 4 glucose tablets, OR glucose gel. o Recheck blood sugar in 15 minutes after treatment (to make sure it is greater than 70 mg/dL). If your blood sugar is not greater than 70 mg/dL on recheck, call [phone] for further instructions. . Report your blood sugar to the short stay nurse when you get to Short Stay.  . If you are admitted to the hospital after surgery: o Your blood sugar will be checked by the staff and you will probably be given insulin after surgery (instead of oral diabetes medicines) to make sure you have good blood sugar levels. o The goal for blood sugar control after surgery is 80-180 mg/dL.  WHAT DO I DO ABOUT MY DIABETES MEDICATION?  Marland Kitchen Do not take GlipiZIDE (GLUCOTROL) and MetFORMIN (GLUCOPHAGE) the morning of surgery.        If your CBG is greater than 220 mg/dL, call the unit [phone]   Do not wear jewelry, make-up or nail polish.  Do not wear lotions, powders, or perfumes, or deodorant.  Do not shave 48 hours prior to surgery.   Do not bring valuables to the hospital.  [facility] is not responsible for  any belongings or valuables.  Contacts, dentures or bridgework may not be worn into surgery.  Leave your suitcase in the car.  After surgery it may be brought to your room.  For patients admitted to the hospital, discharge time will be determined by your treatment team.  Patients discharged the day of surgery will not be allowed to drive home.   Special instructions:  [place]- Preparing For Surgery  Before surgery, you can play an important role. Because skin is not sterile, your skin needs to be as free of germs as possible. You can reduce the number of germs on  your skin by washing with CHG (chlorahexidine gluconate) Soap before surgery.  CHG is an antiseptic cleaner which kills germs and bonds with the skin to continue killing germs even after washing.  Please do not use if you have an allergy to CHG or antibacterial soaps. If your skin becomes reddened/irritated stop using the CHG.  Do not shave (including legs and underarms) for at least 48 hours prior to first CHG shower. It is OK to shave your face.  Please follow these instructions carefully.   1. Shower the NIGHT BEFORE SURGERY and the MORNING OF SURGERY with CHG.   2. If you chose to wash your hair, wash your hair first as usual with your normal shampoo.  3. After you shampoo, rinse your hair and body thoroughly to remove the shampoo.  4. Use CHG as you would any other liquid soap. You can apply CHG directly to the skin and wash gently with a scrungie or a clean washcloth.   5. Apply the CHG Soap to your body ONLY FROM THE NECK DOWN.  Do not use on open wounds or open sores. Avoid contact with your eyes, ears, mouth and genitals (private parts). Wash genitals (private parts) with your normal soap.  USE REGULAR SHAMPOO AND CONDITIONER FOR HAIR USE REGULAR SOAP FOR FACE AND PRIVATE AREA  6. Wash thoroughly, paying special attention to the area where your surgery will be performed.  7. Thoroughly rinse your body with warm water from the neck down.  8. DO NOT shower/wash with your normal soap after using and rinsing off the CHG Soap.  9. Pat yourself dry with a CLEAN TOWEL and [place] CLOTH  10. Wear CLEAN PAJAMAS to bed the night before surgery, wear comfortable clothes the morning of surgery  11. Place CLEAN SHEETS on your bed the night of your first shower and DO NOT SLEEP WITH PETS.  Day of Surgery: Do not apply any deodorants/lotions. Please wear clean clothes to the hospital/surgery center.    Please read over the following fact sheets that you were given. Pain Booklet, Coughing  and Deep Breathing, Blood Transfusion Information, MRSA Information and Surgical Site Infection Prevention

## 2017-08-08 ENCOUNTER — Encounter (HOSPITAL_COMMUNITY): Payer: Self-pay | Admitting: *Deleted

## 2017-08-08 ENCOUNTER — Encounter (HOSPITAL_COMMUNITY)
Admission: RE | Admit: 2017-08-08 | Discharge: 2017-08-08 | Disposition: A | Discharge status: Home / Self Care | Payer: Medicare Other | Source: Ambulatory Visit | Attending: Orthopedic Surgery | Admitting: Orthopedic Surgery

## 2017-08-08 DIAGNOSIS — Z01812 Encounter for preprocedural laboratory examination: Secondary | ICD-10-CM | POA: Insufficient documentation

## 2017-08-08 DIAGNOSIS — Z01810 Encounter for preprocedural cardiovascular examination: Secondary | ICD-10-CM | POA: Diagnosis not present

## 2017-08-08 DIAGNOSIS — M1712 Unilateral primary osteoarthritis, left knee: Secondary | ICD-10-CM | POA: Insufficient documentation

## 2017-08-08 HISTORY — DX: Personal history of urinary calculi: Z87.442

## 2017-08-08 LAB — BASIC METABOLIC PANEL
Anion gap: 10 (ref 5–15)
BUN: 11 mg/dL (ref 6–20)
CALCIUM: 9 mg/dL (ref 8.9–10.3)
CO2: 26 mmol/L (ref 22–32)
CREATININE: 1.01 mg/dL — AB (ref 0.44–1.00)
Chloride: 101 mmol/L (ref 101–111)
GFR calc non Af Amer: 56 mL/min — ABNORMAL LOW (ref >60)
Glucose, Bld: 220 mg/dL — ABNORMAL HIGH (ref 65–99)
Potassium: 3.8 mmol/L (ref 3.5–5.1)
SODIUM: 137 mmol/L (ref 135–145)

## 2017-08-08 LAB — CBC
HCT: 36.9 % (ref 36.0–46.0)
Hemoglobin: 11.9 g/dL — ABNORMAL LOW (ref 12.0–15.0)
MCH: 24 pg — ABNORMAL LOW (ref 26.0–34.0)
MCHC: 32.2 g/dL (ref 30.0–36.0)
MCV: 74.5 fL — ABNORMAL LOW (ref 78.0–100.0)
PLATELETS: 122 10*3/uL — AB (ref 150–400)
RBC: 4.95 MIL/uL (ref 3.87–5.11)
RDW: 15 % (ref 11.5–15.5)
WBC: 4.4 10*3/uL (ref 4.0–10.5)

## 2017-08-08 LAB — HEMOGLOBIN A1C
HEMOGLOBIN A1C: 6.1 % — AB (ref 4.8–5.6)
MEAN PLASMA GLUCOSE: 128.37 mg/dL

## 2017-08-08 LAB — SURGICAL PCR SCREEN
MRSA, PCR: NEGATIVE
Staphylococcus aureus: NEGATIVE

## 2017-08-08 LAB — GLUCOSE, CAPILLARY: GLUCOSE-CAPILLARY: 203 mg/dL — AB (ref 65–99)

## 2017-08-08 LAB — ABO/RH: ABO/RH(D): B POS

## 2017-08-08 NOTE — Pre-Procedure Instructions (Addendum)
Katherine Bell  08/08/2017      [id number] [place], [place] [address] [place] [address] Phone: [phone] Fax: [phone]    Your procedure is scheduled on Monday, August 18, 2017  Report to [facility] Admitting Entrance "A" at 7:45 A.M.  Call this number if you have problems the morning of surgery:  [phone]   Remember:  Do not eat food or drink liquids after midnight.  Take these medicines the morning of surgery with A SIP OF WATER: Acyclovir (ZOVIRAX), Levothyroxine (SYNTHROID, LEVOTHROID), Pantoprazole (PROTONIX), and Sertraline (ZOLOFT). If needed Fexofenadine [facility]) for allergies.   08/11/17  stop taking all aspirin ,Aspirin Products, all Vitamins, Fish oils, and Herbal medications. Also stop all NSAIDS i.e. Advil, Motrin, Aleve, Anaprox, Naproxen, BC and Goody Powders.,voltaren gel  No diabetic meds am of surgery  How to Manage Your Diabetes Before and After Surgery  Why is it important to control my blood sugar before and after surgery? . Improving blood sugar levels before and after surgery helps healing and can limit problems. . A way of improving blood sugar control is eating a healthy diet by: o  Eating less sugar and carbohydrates o  Increasing activity/exercise o  Talking with your doctor about reaching your blood sugar goals . High blood sugars (greater than 180 mg/dL) can raise your risk of infections and slow your recovery, so you will need to focus on controlling your diabetes during the weeks before surgery. . Make sure that the doctor who takes care of your diabetes knows about your planned surgery including the date and location.  How do I manage my blood sugar before surgery? . Check your blood sugar at least 4 times a day, starting 2 days before surgery, to make sure that the level is not too high or low. o Check your blood sugar the morning of your surgery when  you wake up and every 2 hours until you get to the Short Stay unit. . If your blood sugar is less than 70 mg/dL, you will need to treat for low blood sugar: o Do not take insulin. o Treat a low blood sugar (less than 70 mg/dL) with  cup of clear juice (cranberry or apple), 4 glucose tablets, OR glucose gel. o Recheck blood sugar in 15 minutes after treatment (to make sure it is greater than 70 mg/dL). If your blood sugar is not greater than 70 mg/dL on recheck, call [phone] for further instructions. . Report your blood sugar to the short stay nurse when you get to Short Stay.  . If you are admitted to the hospital after surgery: o Your blood sugar will be checked by the staff and you will probably be given insulin after surgery (instead of oral diabetes medicines) to make sure you have good blood sugar levels. o The goal for blood sugar control after surgery is 80-180 mg/dL.  WHAT DO I DO ABOUT MY DIABETES MEDICATION?  Marland Kitchen Do not take GlipiZIDE (GLUCOTROL) or MetFORMIN (GLUCOPHAGE) the morning of surgery.        If your CBG is greater than 220 mg/dL, call the unit [phone]   Do not wear jewelry, make-up or nail polish.  Do not wear lotions, powders, or perfumes, or deodorant.  Do not shave 48 hours prior to surgery.   Do not bring valuables to the hospital.  [facility] is not responsible for any  belongings or valuables.  Contacts, dentures or bridgework may not be worn into surgery.  Leave your suitcase in the car.  After surgery it may be brought to your room.  For patients admitted to the hospital, discharge time will be determined by your treatment team.  Patients discharged the day of surgery will not be allowed to drive home.   Special instructions:  [place]- Preparing For Surgery  Before surgery, you can play an important role. Because skin is not sterile, your skin needs to be as free of germs as possible. You can reduce the number of germs on your skin by washing  with CHG (chlorahexidine gluconate) Soap before surgery.  CHG is an antiseptic cleaner which kills germs and bonds with the skin to continue killing germs even after washing.  Please do not use if you have an allergy to CHG or antibacterial soaps. If your skin becomes reddened/irritated stop using the CHG.  Do not shave (including legs and underarms) for at least 48 hours prior to first CHG shower. It is OK to shave your face.  Please follow these instructions carefully.   1. Shower the NIGHT BEFORE SURGERY and the MORNING OF SURGERY with CHG.   2. If you chose to wash your hair, wash your hair first as usual with your normal shampoo.  3. After you shampoo, rinse your hair and body thoroughly to remove the shampoo.  4. Use CHG as you would any other liquid soap. You can apply CHG directly to the skin and wash gently with a scrungie or a clean washcloth.   5. Apply the CHG Soap to your body ONLY FROM THE NECK DOWN.  Do not use on open wounds or open sores. Avoid contact with your eyes, ears, mouth and genitals (private parts). Wash genitals (private parts) with your normal soap.  USE REGULAR SHAMPOO AND CONDITIONER FOR HAIR USE REGULAR SOAP FOR FACE AND PRIVATE AREA  6. Wash thoroughly, paying special attention to the area where your surgery will be performed.  7. Thoroughly rinse your body with warm water from the neck down.  8. DO NOT shower/wash with your normal soap after using and rinsing off the CHG Soap.  9. Pat yourself dry with a CLEAN TOWEL and [place] CLOTH  10. Wear CLEAN PAJAMAS to bed the night before surgery, wear comfortable clothes the morning of surgery  11. Place CLEAN SHEETS on your bed the night of your first shower and DO NOT SLEEP WITH PETS.  Day of Surgery: Do not apply any deodorants/lotions. Please wear clean clothes to the hospital/surgery center.    Please read over the  fact sheets that you were given.

## 2017-08-12 DIAGNOSIS — E119 Type 2 diabetes mellitus without complications: Secondary | ICD-10-CM | POA: Diagnosis not present

## 2017-08-12 DIAGNOSIS — E7800 Pure hypercholesterolemia, unspecified: Secondary | ICD-10-CM | POA: Diagnosis not present

## 2017-08-12 DIAGNOSIS — I10 Essential (primary) hypertension: Secondary | ICD-10-CM | POA: Diagnosis not present

## 2017-08-13 ENCOUNTER — Encounter: Payer: Medicare Other | Admitting: Vascular Surgery

## 2017-08-13 ENCOUNTER — Encounter (HOSPITAL_COMMUNITY): Payer: Medicare Other

## 2017-08-15 ENCOUNTER — Encounter (HOSPITAL_COMMUNITY): Payer: Self-pay | Admitting: Certified Registered"

## 2017-08-15 MED ORDER — VANCOMYCIN HCL IN DEXTROSE 1-5 GM/200ML-% IV SOLN
1000.0000 mg | INTRAVENOUS | Status: AC
  Administered 2017-08-18: 1000 mg via INTRAVENOUS
  Filled 2017-08-15: qty 200

## 2017-08-18 ENCOUNTER — Inpatient Hospital Stay (HOSPITAL_COMMUNITY): Payer: Medicare Other | Admitting: Certified Registered"

## 2017-08-18 ENCOUNTER — Encounter (HOSPITAL_COMMUNITY)
Admission: RE | Disposition: A | Discharge status: Home Health | Payer: Self-pay | Source: Ambulatory Visit | Attending: Orthopedic Surgery

## 2017-08-18 ENCOUNTER — Inpatient Hospital Stay (HOSPITAL_COMMUNITY)
Admission: RE | Admit: 2017-08-18 | Discharge: 2017-08-26 | DRG: 470 | Disposition: A | Discharge status: Home Health | Payer: Medicare Other | Source: Ambulatory Visit | Attending: Orthopedic Surgery | Admitting: Orthopedic Surgery

## 2017-08-18 ENCOUNTER — Encounter (HOSPITAL_COMMUNITY): Payer: Self-pay | Admitting: *Deleted

## 2017-08-18 DIAGNOSIS — I10 Essential (primary) hypertension: Secondary | ICD-10-CM | POA: Diagnosis present

## 2017-08-18 DIAGNOSIS — Z961 Presence of intraocular lens: Secondary | ICD-10-CM | POA: Diagnosis present

## 2017-08-18 DIAGNOSIS — R7989 Other specified abnormal findings of blood chemistry: Secondary | ICD-10-CM | POA: Diagnosis not present

## 2017-08-18 DIAGNOSIS — D62 Acute posthemorrhagic anemia: Secondary | ICD-10-CM | POA: Diagnosis not present

## 2017-08-18 DIAGNOSIS — M1712 Unilateral primary osteoarthritis, left knee: Secondary | ICD-10-CM | POA: Diagnosis not present

## 2017-08-18 DIAGNOSIS — D61818 Other pancytopenia: Secondary | ICD-10-CM | POA: Diagnosis not present

## 2017-08-18 DIAGNOSIS — B0052 Herpesviral keratitis: Secondary | ICD-10-CM | POA: Diagnosis present

## 2017-08-18 DIAGNOSIS — E6609 Other obesity due to excess calories: Secondary | ICD-10-CM | POA: Diagnosis present

## 2017-08-18 DIAGNOSIS — H16002 Unspecified corneal ulcer, left eye: Secondary | ICD-10-CM | POA: Diagnosis present

## 2017-08-18 DIAGNOSIS — Z882 Allergy status to sulfonamides status: Secondary | ICD-10-CM

## 2017-08-18 DIAGNOSIS — D696 Thrombocytopenia, unspecified: Secondary | ICD-10-CM | POA: Diagnosis not present

## 2017-08-18 DIAGNOSIS — Z7984 Long term (current) use of oral hypoglycemic drugs: Secondary | ICD-10-CM | POA: Diagnosis not present

## 2017-08-18 DIAGNOSIS — E7800 Pure hypercholesterolemia, unspecified: Secondary | ICD-10-CM | POA: Diagnosis present

## 2017-08-18 DIAGNOSIS — E039 Hypothyroidism, unspecified: Secondary | ICD-10-CM | POA: Diagnosis present

## 2017-08-18 DIAGNOSIS — F419 Anxiety disorder, unspecified: Secondary | ICD-10-CM | POA: Diagnosis present

## 2017-08-18 DIAGNOSIS — M25762 Osteophyte, left knee: Secondary | ICD-10-CM | POA: Diagnosis present

## 2017-08-18 DIAGNOSIS — M21162 Varus deformity, not elsewhere classified, left knee: Secondary | ICD-10-CM | POA: Diagnosis present

## 2017-08-18 DIAGNOSIS — Z7982 Long term (current) use of aspirin: Secondary | ICD-10-CM | POA: Diagnosis not present

## 2017-08-18 DIAGNOSIS — R161 Splenomegaly, not elsewhere classified: Secondary | ICD-10-CM

## 2017-08-18 DIAGNOSIS — R0602 Shortness of breath: Secondary | ICD-10-CM | POA: Diagnosis not present

## 2017-08-18 DIAGNOSIS — Z87442 Personal history of urinary calculi: Secondary | ICD-10-CM

## 2017-08-18 DIAGNOSIS — K219 Gastro-esophageal reflux disease without esophagitis: Secondary | ICD-10-CM | POA: Diagnosis present

## 2017-08-18 DIAGNOSIS — K5900 Constipation, unspecified: Secondary | ICD-10-CM | POA: Diagnosis not present

## 2017-08-18 DIAGNOSIS — Z833 Family history of diabetes mellitus: Secondary | ICD-10-CM

## 2017-08-18 DIAGNOSIS — M7989 Other specified soft tissue disorders: Secondary | ICD-10-CM | POA: Diagnosis not present

## 2017-08-18 DIAGNOSIS — Z79899 Other long term (current) drug therapy: Secondary | ICD-10-CM | POA: Diagnosis not present

## 2017-08-18 DIAGNOSIS — R509 Fever, unspecified: Secondary | ICD-10-CM | POA: Diagnosis not present

## 2017-08-18 DIAGNOSIS — E119 Type 2 diabetes mellitus without complications: Secondary | ICD-10-CM | POA: Diagnosis present

## 2017-08-18 DIAGNOSIS — I700 Atherosclerosis of aorta: Secondary | ICD-10-CM | POA: Diagnosis not present

## 2017-08-18 DIAGNOSIS — G4733 Obstructive sleep apnea (adult) (pediatric): Secondary | ICD-10-CM | POA: Diagnosis present

## 2017-08-18 DIAGNOSIS — G8918 Other acute postprocedural pain: Secondary | ICD-10-CM | POA: Diagnosis not present

## 2017-08-18 DIAGNOSIS — Z79890 Hormone replacement therapy (postmenopausal): Secondary | ICD-10-CM | POA: Diagnosis not present

## 2017-08-18 DIAGNOSIS — Z8249 Family history of ischemic heart disease and other diseases of the circulatory system: Secondary | ICD-10-CM

## 2017-08-18 DIAGNOSIS — D473 Essential (hemorrhagic) thrombocythemia: Secondary | ICD-10-CM | POA: Diagnosis not present

## 2017-08-18 DIAGNOSIS — K8020 Calculus of gallbladder without cholecystitis without obstruction: Secondary | ICD-10-CM | POA: Diagnosis present

## 2017-08-18 DIAGNOSIS — D638 Anemia in other chronic diseases classified elsewhere: Secondary | ICD-10-CM | POA: Diagnosis present

## 2017-08-18 DIAGNOSIS — D75839 Thrombocytosis, unspecified: Secondary | ICD-10-CM

## 2017-08-18 DIAGNOSIS — Z880 Allergy status to penicillin: Secondary | ICD-10-CM

## 2017-08-18 DIAGNOSIS — Z9842 Cataract extraction status, left eye: Secondary | ICD-10-CM

## 2017-08-18 DIAGNOSIS — M25562 Pain in left knee: Secondary | ICD-10-CM | POA: Diagnosis not present

## 2017-08-18 DIAGNOSIS — Z6829 Body mass index (BMI) 29.0-29.9, adult: Secondary | ICD-10-CM

## 2017-08-18 HISTORY — PX: TOTAL KNEE ARTHROPLASTY: SHX125

## 2017-08-18 HISTORY — DX: Anemia in other chronic diseases classified elsewhere: D63.8

## 2017-08-18 HISTORY — DX: Unilateral primary osteoarthritis, left knee: M17.12

## 2017-08-18 HISTORY — DX: Acute posthemorrhagic anemia: D62

## 2017-08-18 LAB — GLUCOSE, CAPILLARY
GLUCOSE-CAPILLARY: 146 mg/dL — AB (ref 65–99)
GLUCOSE-CAPILLARY: 241 mg/dL — AB (ref 65–99)
Glucose-Capillary: 162 mg/dL — ABNORMAL HIGH (ref 65–99)
Glucose-Capillary: 142 mg/dL — ABNORMAL HIGH (ref 65–99)
Glucose-Capillary: 274 mg/dL — ABNORMAL HIGH (ref 65–99)

## 2017-08-18 SURGERY — ARTHROPLASTY, KNEE, TOTAL
Anesthesia: Spinal | Site: Knee | Laterality: Left

## 2017-08-18 MED ORDER — LOSARTAN POTASSIUM 25 MG PO TABS
25.0000 mg | ORAL_TABLET | Freq: Every day | ORAL | Status: DC
  Administered 2017-08-20 – 2017-08-26 (×8): 25 mg via ORAL
  Filled 2017-08-18 (×7): qty 1

## 2017-08-18 MED ORDER — OXYCODONE HCL 5 MG PO TABS: 5.0000 mg | ORAL_TABLET | Freq: Once | ORAL | Status: DC | PRN

## 2017-08-18 MED ORDER — ONDANSETRON HCL 4 MG/2ML IJ SOLN: 4.0000 mg | Freq: Once | INTRAMUSCULAR | Status: DC | PRN

## 2017-08-18 MED ORDER — TRANEXAMIC ACID 1000 MG/10ML IV SOLN
1000.0000 mg | INTRAVENOUS | Status: AC
  Administered 2017-08-18: 1000 mg via INTRAVENOUS
  Filled 2017-08-18: qty 10

## 2017-08-18 MED ORDER — FENTANYL CITRATE (PF) 100 MCG/2ML IJ SOLN
25.0000 ug | INTRAMUSCULAR | Status: DC | PRN
  Administered 2017-08-18 (×3): 50 ug via INTRAVENOUS

## 2017-08-18 MED ORDER — DIPHENHYDRAMINE HCL 50 MG/ML IJ SOLN
12.5000 mg | Freq: Once | INTRAMUSCULAR | Status: AC
  Administered 2017-08-18: 12.5 mg via INTRAVENOUS

## 2017-08-18 MED ORDER — ACETAMINOPHEN 325 MG PO TABS
ORAL_TABLET | ORAL | Status: AC
  Filled 2017-08-18: qty 2

## 2017-08-18 MED ORDER — SERTRALINE HCL 100 MG PO TABS
100.0000 mg | ORAL_TABLET | Freq: Two times a day (BID) | ORAL | Status: DC
  Administered 2017-08-18 – 2017-08-26 (×17): 100 mg via ORAL
  Filled 2017-08-18 (×16): qty 1

## 2017-08-18 MED ORDER — DIPHENHYDRAMINE HCL 50 MG/ML IJ SOLN
INTRAMUSCULAR | Status: AC
  Administered 2017-08-18: 12.5 mg via INTRAVENOUS
  Filled 2017-08-18: qty 1

## 2017-08-18 MED ORDER — PHENYLEPHRINE HCL 10 MG/ML IJ SOLN
INTRAMUSCULAR | Status: DC | PRN
  Administered 2017-08-18: 40 ug/min via INTRAVENOUS

## 2017-08-18 MED ORDER — LIDOCAINE 2% (20 MG/ML) 5 ML SYRINGE
INTRAMUSCULAR | Status: DC | PRN
  Administered 2017-08-18: 40 mg via INTRAVENOUS

## 2017-08-18 MED ORDER — BUPIVACAINE-EPINEPHRINE 0.25% -1:200000 IJ SOLN
INTRAMUSCULAR | Status: DC | PRN
  Administered 2017-08-18: 30 mL

## 2017-08-18 MED ORDER — BUPIVACAINE-EPINEPHRINE (PF) 0.25% -1:200000 IJ SOLN
INTRAMUSCULAR | Status: AC
  Filled 2017-08-18: qty 30

## 2017-08-18 MED ORDER — FENTANYL CITRATE (PF) 100 MCG/2ML IJ SOLN
INTRAMUSCULAR | Status: DC | PRN
  Administered 2017-08-18: 50 ug via INTRAVENOUS
  Administered 2017-08-18: 25 ug via INTRAVENOUS
  Administered 2017-08-18: 50 ug via INTRAVENOUS

## 2017-08-18 MED ORDER — LEVOTHYROXINE SODIUM 100 MCG PO TABS
100.0000 ug | ORAL_TABLET | Freq: Every day | ORAL | Status: DC
  Administered 2017-08-19 – 2017-08-26 (×10): 100 ug via ORAL
  Filled 2017-08-18 (×11): qty 1

## 2017-08-18 MED ORDER — ONDANSETRON HCL 4 MG/2ML IJ SOLN
INTRAMUSCULAR | Status: DC | PRN
  Administered 2017-08-18: 4 mg via INTRAVENOUS

## 2017-08-18 MED ORDER — MIDAZOLAM HCL 2 MG/2ML IJ SOLN
INTRAMUSCULAR | Status: AC
  Administered 2017-08-18: 2 mg via INTRAVENOUS
  Filled 2017-08-18: qty 2

## 2017-08-18 MED ORDER — LISINOPRIL 5 MG PO TABS
5.0000 mg | ORAL_TABLET | Freq: Every day | ORAL | Status: DC
  Administered 2017-08-20 – 2017-08-26 (×8): 5 mg via ORAL
  Filled 2017-08-18 (×7): qty 1

## 2017-08-18 MED ORDER — CHLORHEXIDINE GLUCONATE 4 % EX LIQD: 60.0000 mL | Freq: Once | CUTANEOUS | Status: DC

## 2017-08-18 MED ORDER — ALUM & MAG HYDROXIDE-SIMETH 200-200-20 MG/5ML PO SUSP: 30.0000 mL | ORAL | Status: DC | PRN

## 2017-08-18 MED ORDER — PANTOPRAZOLE SODIUM 40 MG PO TBEC
40.0000 mg | DELAYED_RELEASE_TABLET | Freq: Two times a day (BID) | ORAL | Status: DC
  Administered 2017-08-18 – 2017-08-26 (×17): 40 mg via ORAL
  Filled 2017-08-18 (×16): qty 1

## 2017-08-18 MED ORDER — POTASSIUM CHLORIDE IN NACL 20-0.9 MEQ/L-% IV SOLN
INTRAVENOUS | Status: DC
  Administered 2017-08-18 – 2017-08-24 (×4): via INTRAVENOUS
  Filled 2017-08-18 (×4): qty 1000

## 2017-08-18 MED ORDER — MENTHOL 3 MG MT LOZG
1.0000 | LOZENGE | OROMUCOSAL | Status: DC | PRN
Start: 2017-08-18 — End: 2017-08-26

## 2017-08-18 MED ORDER — ATORVASTATIN CALCIUM 20 MG PO TABS
20.0000 mg | ORAL_TABLET | Freq: Every day | ORAL | Status: DC
  Administered 2017-08-19 – 2017-08-26 (×9): 20 mg via ORAL
  Filled 2017-08-18 (×8): qty 1

## 2017-08-18 MED ORDER — LACTATED RINGERS IV SOLN
INTRAVENOUS | Status: DC
  Administered 2017-08-18 (×2): via INTRAVENOUS

## 2017-08-18 MED ORDER — CALCIUM CARBONATE 1500 (600 CA) MG PO TABS
1200.0000 mg | ORAL_TABLET | Freq: Every day | ORAL | Status: DC
  Filled 2017-08-18: qty 1

## 2017-08-18 MED ORDER — LORATADINE 10 MG PO TABS
10.0000 mg | ORAL_TABLET | Freq: Every day | ORAL | Status: DC
  Administered 2017-08-18 – 2017-08-26 (×10): 10 mg via ORAL
  Filled 2017-08-18 (×9): qty 1

## 2017-08-18 MED ORDER — ONDANSETRON HCL 4 MG/2ML IJ SOLN
4.0000 mg | Freq: Four times a day (QID) | INTRAMUSCULAR | Status: DC | PRN
Start: 2017-08-18 — End: 2017-08-26
  Administered 2017-08-23: 4 mg via INTRAVENOUS
  Filled 2017-08-18: qty 2

## 2017-08-18 MED ORDER — METOCLOPRAMIDE HCL 5 MG PO TABS: 5.0000 mg | ORAL_TABLET | Freq: Three times a day (TID) | ORAL | Status: DC | PRN

## 2017-08-18 MED ORDER — CEFUROXIME SODIUM 750 MG IJ SOLR
INTRAMUSCULAR | Status: AC
  Filled 2017-08-18: qty 1500

## 2017-08-18 MED ORDER — CALCIUM CARBONATE 1250 (500 CA) MG PO TABS
1.0000 | ORAL_TABLET | Freq: Every day | ORAL | Status: DC
  Administered 2017-08-19 – 2017-08-26 (×8): 500 mg via ORAL
  Filled 2017-08-18 (×8): qty 1

## 2017-08-18 MED ORDER — INSULIN ASPART 100 UNIT/ML ~~LOC~~ SOLN
6.0000 [IU] | Freq: Three times a day (TID) | SUBCUTANEOUS | Status: DC
  Administered 2017-08-18 – 2017-08-26 (×15): 6 [IU] via SUBCUTANEOUS

## 2017-08-18 MED ORDER — HYDROMORPHONE HCL 1 MG/ML IJ SOLN
1.0000 mg | INTRAMUSCULAR | Status: DC | PRN
  Administered 2017-08-18 – 2017-08-24 (×18): 1 mg via INTRAVENOUS
  Filled 2017-08-18 (×17): qty 1

## 2017-08-18 MED ORDER — PROPOFOL 500 MG/50ML IV EMUL
INTRAVENOUS | Status: DC | PRN
  Administered 2017-08-18: 150 ug/kg/min via INTRAVENOUS

## 2017-08-18 MED ORDER — INSULIN ASPART 100 UNIT/ML ~~LOC~~ SOLN
0.0000 [IU] | Freq: Every day | SUBCUTANEOUS | Status: DC
  Administered 2017-08-18 – 2017-08-24 (×3): 2 [IU] via SUBCUTANEOUS

## 2017-08-18 MED ORDER — FENTANYL CITRATE (PF) 100 MCG/2ML IJ SOLN
INTRAMUSCULAR | Status: AC
  Administered 2017-08-18: 50 ug via INTRAVENOUS
  Filled 2017-08-18: qty 2

## 2017-08-18 MED ORDER — POLYETHYLENE GLYCOL 3350 17 G PO PACK
17.0000 g | PACK | Freq: Two times a day (BID) | ORAL | Status: DC
  Administered 2017-08-18 – 2017-08-26 (×14): 17 g via ORAL
  Filled 2017-08-18 (×16): qty 1

## 2017-08-18 MED ORDER — DOCUSATE SODIUM 100 MG PO CAPS
100.0000 mg | ORAL_CAPSULE | Freq: Two times a day (BID) | ORAL | Status: DC
  Administered 2017-08-18 – 2017-08-26 (×15): 100 mg via ORAL
  Filled 2017-08-18 (×16): qty 1

## 2017-08-18 MED ORDER — CEFAZOLIN SODIUM-DEXTROSE 2-4 GM/100ML-% IV SOLN
2.0000 g | Freq: Four times a day (QID) | INTRAVENOUS | Status: AC
  Administered 2017-08-18 – 2017-08-19 (×2): 2 g via INTRAVENOUS
  Filled 2017-08-18 (×2): qty 100

## 2017-08-18 MED ORDER — SODIUM CHLORIDE 0.9 % IR SOLN
Status: DC | PRN
  Administered 2017-08-18: 3000 mL

## 2017-08-18 MED ORDER — FENTANYL CITRATE (PF) 100 MCG/2ML IJ SOLN
100.0000 ug | Freq: Once | INTRAMUSCULAR | Status: AC
  Administered 2017-08-18: 100 ug via INTRAVENOUS

## 2017-08-18 MED ORDER — OXYCODONE HCL 5 MG/5ML PO SOLN: 5.0000 mg | Freq: Once | ORAL | Status: DC | PRN

## 2017-08-18 MED ORDER — HYDROMORPHONE HCL 1 MG/ML IJ SOLN
0.5000 mg | INTRAMUSCULAR | Status: AC | PRN
  Administered 2017-08-18 (×4): 0.5 mg via INTRAVENOUS

## 2017-08-18 MED ORDER — ONDANSETRON HCL 4 MG PO TABS: 4.0000 mg | ORAL_TABLET | Freq: Four times a day (QID) | ORAL | Status: DC | PRN

## 2017-08-18 MED ORDER — ACYCLOVIR 400 MG PO TABS
400.0000 mg | ORAL_TABLET | Freq: Two times a day (BID) | ORAL | Status: DC
  Administered 2017-08-18 – 2017-08-26 (×16): 400 mg via ORAL
  Filled 2017-08-18 (×17): qty 1

## 2017-08-18 MED ORDER — FENTANYL CITRATE (PF) 100 MCG/2ML IJ SOLN
INTRAMUSCULAR | Status: AC
  Administered 2017-08-18: 100 ug via INTRAVENOUS
  Filled 2017-08-18: qty 2

## 2017-08-18 MED ORDER — DEXAMETHASONE SODIUM PHOSPHATE 10 MG/ML IJ SOLN
10.0000 mg | Freq: Three times a day (TID) | INTRAMUSCULAR | Status: AC
  Administered 2017-08-18 – 2017-08-19 (×4): 10 mg via INTRAVENOUS
  Filled 2017-08-18 (×4): qty 1

## 2017-08-18 MED ORDER — HYDROMORPHONE HCL 1 MG/ML IJ SOLN
INTRAMUSCULAR | Status: AC
  Administered 2017-08-18: 0.5 mg via INTRAVENOUS
  Filled 2017-08-18: qty 1

## 2017-08-18 MED ORDER — POVIDONE-IODINE 7.5 % EX SOLN: Freq: Once | CUTANEOUS | Status: DC

## 2017-08-18 MED ORDER — DEXAMETHASONE SODIUM PHOSPHATE 10 MG/ML IJ SOLN
INTRAMUSCULAR | Status: DC | PRN
  Administered 2017-08-18: 10 mg via INTRAVENOUS

## 2017-08-18 MED ORDER — 0.9 % SODIUM CHLORIDE (POUR BTL) OPTIME
TOPICAL | Status: DC | PRN
  Administered 2017-08-18: 1000 mL

## 2017-08-18 MED ORDER — PHENOL 1.4 % MT LIQD: 1.0000 | OROMUCOSAL | Status: DC | PRN

## 2017-08-18 MED ORDER — INSULIN ASPART 100 UNIT/ML ~~LOC~~ SOLN
0.0000 [IU] | Freq: Three times a day (TID) | SUBCUTANEOUS | Status: DC
  Administered 2017-08-18: 11 [IU] via SUBCUTANEOUS
  Administered 2017-08-19: 17 [IU] via SUBCUTANEOUS
  Administered 2017-08-19: 7 [IU] via SUBCUTANEOUS
  Administered 2017-08-19: 11 [IU] via SUBCUTANEOUS
  Administered 2017-08-20 – 2017-08-22 (×4): 4 [IU] via SUBCUTANEOUS
  Administered 2017-08-23: 3 [IU] via SUBCUTANEOUS
  Administered 2017-08-23: 4 [IU] via SUBCUTANEOUS
  Administered 2017-08-23: 7 [IU] via SUBCUTANEOUS
  Administered 2017-08-24 – 2017-08-25 (×3): 4 [IU] via SUBCUTANEOUS
  Administered 2017-08-25: 3 [IU] via SUBCUTANEOUS
  Administered 2017-08-25 – 2017-08-26 (×2): 4 [IU] via SUBCUTANEOUS
  Administered 2017-08-26: 11 [IU] via SUBCUTANEOUS

## 2017-08-18 MED ORDER — ASPIRIN EC 325 MG PO TBEC
325.0000 mg | DELAYED_RELEASE_TABLET | Freq: Every day | ORAL | Status: DC
  Administered 2017-08-19 – 2017-08-20 (×2): 325 mg via ORAL
  Filled 2017-08-18 (×3): qty 1

## 2017-08-18 MED ORDER — HYDROMORPHONE HCL 1 MG/ML IJ SOLN
INTRAMUSCULAR | Status: AC
  Administered 2017-08-18: 1 mg via INTRAVENOUS
  Filled 2017-08-18: qty 1

## 2017-08-18 MED ORDER — INSULIN GLARGINE 100 UNIT/ML ~~LOC~~ SOLN
30.0000 [IU] | Freq: Every day | SUBCUTANEOUS | Status: DC
  Administered 2017-08-18 – 2017-08-25 (×8): 30 [IU] via SUBCUTANEOUS
  Filled 2017-08-18 (×9): qty 0.3

## 2017-08-18 MED ORDER — ACETAMINOPHEN 500 MG PO TABS
1000.0000 mg | ORAL_TABLET | Freq: Four times a day (QID) | ORAL | Status: AC
  Administered 2017-08-18 – 2017-08-19 (×2): 1000 mg via ORAL
  Filled 2017-08-18 (×2): qty 2

## 2017-08-18 MED ORDER — DIPHENHYDRAMINE HCL 12.5 MG/5ML PO ELIX
12.5000 mg | ORAL_SOLUTION | ORAL | Status: DC | PRN
  Administered 2017-08-18 – 2017-08-23 (×3): 25 mg via ORAL
  Filled 2017-08-18 (×3): qty 10

## 2017-08-18 MED ORDER — OXYCODONE HCL 5 MG PO TABS
5.0000 mg | ORAL_TABLET | ORAL | Status: DC | PRN
  Administered 2017-08-18 (×2): 10 mg via ORAL
  Administered 2017-08-18: 5 mg via ORAL
  Administered 2017-08-19 – 2017-08-26 (×29): 10 mg via ORAL
  Filled 2017-08-18 (×18): qty 2
  Filled 2017-08-18: qty 1
  Filled 2017-08-18 (×11): qty 2

## 2017-08-18 MED ORDER — METOCLOPRAMIDE HCL 5 MG/ML IJ SOLN: 5.0000 mg | Freq: Three times a day (TID) | INTRAMUSCULAR | Status: DC | PRN

## 2017-08-18 MED ORDER — MIDAZOLAM HCL 2 MG/2ML IJ SOLN
2.0000 mg | Freq: Once | INTRAMUSCULAR | Status: AC
  Administered 2017-08-18: 2 mg via INTRAVENOUS

## 2017-08-18 MED ORDER — ONDANSETRON HCL 4 MG/2ML IJ SOLN
INTRAMUSCULAR | Status: AC
  Filled 2017-08-18: qty 2

## 2017-08-18 MED ORDER — FENTANYL CITRATE (PF) 250 MCG/5ML IJ SOLN
INTRAMUSCULAR | Status: AC
  Filled 2017-08-18: qty 5

## 2017-08-18 MED ORDER — DEXAMETHASONE SODIUM PHOSPHATE 10 MG/ML IJ SOLN
INTRAMUSCULAR | Status: AC
  Filled 2017-08-18: qty 1

## 2017-08-18 MED ORDER — OXYCODONE HCL 5 MG PO TABS
ORAL_TABLET | ORAL | Status: AC
  Filled 2017-08-18: qty 2

## 2017-08-18 MED ORDER — ACETAMINOPHEN 325 MG PO TABS
650.0000 mg | ORAL_TABLET | Freq: Four times a day (QID) | ORAL | Status: DC | PRN
  Administered 2017-08-18 – 2017-08-25 (×13): 650 mg via ORAL
  Filled 2017-08-18 (×12): qty 2

## 2017-08-18 MED ORDER — ACETAMINOPHEN 650 MG RE SUPP: 650.0000 mg | Freq: Four times a day (QID) | RECTAL | Status: DC | PRN

## 2017-08-18 SURGICAL SUPPLY — 71 items
BANDAGE ESMARK 6X9 LF (GAUZE/BANDAGES/DRESSINGS) ×1 IMPLANT
BENZOIN TINCTURE PRP APPL 2/3 (GAUZE/BANDAGES/DRESSINGS) ×3 IMPLANT
BLADE SAGITTAL 25.0X1.19X90 (BLADE) ×2 IMPLANT
BLADE SAGITTAL 25.0X1.19X90MM (BLADE) ×1
BLADE SAW SGTL 13X75X1.27 (BLADE) ×3 IMPLANT
BLADE SURG 10 STRL SS (BLADE) ×6 IMPLANT
BNDG ELASTIC 6X15 VLCR STRL LF (GAUZE/BANDAGES/DRESSINGS) ×3 IMPLANT
BNDG ESMARK 6X9 LF (GAUZE/BANDAGES/DRESSINGS) ×3
BOWL SMART MIX CTS (DISPOSABLE) ×3 IMPLANT
CAPT KNEE TOTAL 3 ATTUNE ×3 IMPLANT
CEMENT HV SMART SET (Cement) ×3 IMPLANT
CLOSURE STERI-STRIP 1/2X4 (GAUZE/BANDAGES/DRESSINGS) ×1
CLOSURE WOUND 1/2 X4 (GAUZE/BANDAGES/DRESSINGS) ×1
CLSR STERI-STRIP ANTIMIC 1/2X4 (GAUZE/BANDAGES/DRESSINGS) ×2 IMPLANT
COVER SURGICAL LIGHT HANDLE (MISCELLANEOUS) ×3 IMPLANT
CUFF TOURNIQUET SINGLE 34IN LL (TOURNIQUET CUFF) ×3 IMPLANT
CUFF TOURNIQUET SINGLE 44IN (TOURNIQUET CUFF) IMPLANT
DECANTER SPIKE VIAL GLASS SM (MISCELLANEOUS) ×3 IMPLANT
DRAPE EXTREMITY T 121X128X90 (DRAPE) ×3 IMPLANT
DRAPE HALF SHEET 40X57 (DRAPES) ×6 IMPLANT
DRAPE INCISE IOBAN 66X45 STRL (DRAPES) IMPLANT
DRAPE U-SHAPE 47X51 STRL (DRAPES) ×3 IMPLANT
DRSG AQUACEL AG ADV 3.5X10 (GAUZE/BANDAGES/DRESSINGS) ×3 IMPLANT
DRSG AQUACEL AG ADV 3.5X14 (GAUZE/BANDAGES/DRESSINGS) ×3 IMPLANT
DURAPREP 26ML APPLICATOR (WOUND CARE) ×6 IMPLANT
ELECT CAUTERY BLADE 6.4 (BLADE) ×3 IMPLANT
ELECT REM PT RETURN 9FT ADLT (ELECTROSURGICAL) ×3
ELECTRODE REM PT RTRN 9FT ADLT (ELECTROSURGICAL) ×1 IMPLANT
FACESHIELD WRAPAROUND (MASK) ×3 IMPLANT
GLOVE BIO SURGEON STRL SZ7 (GLOVE) ×3 IMPLANT
GLOVE BIOGEL PI IND STRL 7.0 (GLOVE) ×1 IMPLANT
GLOVE BIOGEL PI IND STRL 7.5 (GLOVE) ×1 IMPLANT
GLOVE BIOGEL PI INDICATOR 7.0 (GLOVE) ×2
GLOVE BIOGEL PI INDICATOR 7.5 (GLOVE) ×2
GLOVE SS BIOGEL STRL SZ 7.5 (GLOVE) ×1 IMPLANT
GLOVE SUPERSENSE BIOGEL SZ 7.5 (GLOVE) ×2
GOWN STRL REUS W/ TWL LRG LVL3 (GOWN DISPOSABLE) ×1 IMPLANT
GOWN STRL REUS W/ TWL XL LVL3 (GOWN DISPOSABLE) ×3 IMPLANT
GOWN STRL REUS W/TWL LRG LVL3 (GOWN DISPOSABLE) ×2
GOWN STRL REUS W/TWL XL LVL3 (GOWN DISPOSABLE) ×6
HANDPIECE INTERPULSE COAX TIP (DISPOSABLE) ×2
HOOD PEEL AWAY FACE SHEILD DIS (HOOD) ×6 IMPLANT
IMMOBILIZER KNEE 22 (SOFTGOODS) ×3 IMPLANT
IMMOBILIZER KNEE 22 UNIV (SOFTGOODS) ×3 IMPLANT
KIT BASIN OR (CUSTOM PROCEDURE TRAY) ×3 IMPLANT
KIT ROOM TURNOVER OR (KITS) ×3 IMPLANT
MANIFOLD NEPTUNE II (INSTRUMENTS) ×3 IMPLANT
MARKER SKIN DUAL TIP RULER LAB (MISCELLANEOUS) ×3 IMPLANT
NEEDLE 18GX1X1/2 (RX/OR ONLY) (NEEDLE) ×3 IMPLANT
NS IRRIG 1000ML POUR BTL (IV SOLUTION) ×3 IMPLANT
PACK TOTAL JOINT (CUSTOM PROCEDURE TRAY) ×3 IMPLANT
PAD ARMBOARD 7.5X6 YLW CONV (MISCELLANEOUS) ×6 IMPLANT
SET HNDPC FAN SPRY TIP SCT (DISPOSABLE) ×1 IMPLANT
STRIP CLOSURE SKIN 1/2X4 (GAUZE/BANDAGES/DRESSINGS) ×2 IMPLANT
SUCTION FRAZIER HANDLE 10FR (MISCELLANEOUS) ×2
SUCTION TUBE FRAZIER 10FR DISP (MISCELLANEOUS) ×1 IMPLANT
SUT MNCRL AB 3-0 PS2 18 (SUTURE) ×3 IMPLANT
SUT VIC AB 0 CT1 27 (SUTURE) ×4
SUT VIC AB 0 CT1 27XBRD ANBCTR (SUTURE) ×2 IMPLANT
SUT VIC AB 1 CT1 27 (SUTURE) ×2
SUT VIC AB 1 CT1 27XBRD ANBCTR (SUTURE) ×1 IMPLANT
SUT VIC AB 2-0 CT1 27 (SUTURE) ×4
SUT VIC AB 2-0 CT1 TAPERPNT 27 (SUTURE) ×2 IMPLANT
SYR 30ML LL (SYRINGE) ×3 IMPLANT
TOWEL OR 17X24 6PK STRL BLUE (TOWEL DISPOSABLE) ×3 IMPLANT
TOWEL OR 17X26 10 PK STRL BLUE (TOWEL DISPOSABLE) ×3 IMPLANT
TRAY CATH 16FR W/PLASTIC CATH (SET/KITS/TRAYS/PACK) IMPLANT
TRAY FOLEY CATH SILVER 16FR (SET/KITS/TRAYS/PACK) ×3 IMPLANT
TUBE CONNECTING 12'X1/4 (SUCTIONS) ×1
TUBE CONNECTING 12X1/4 (SUCTIONS) ×2 IMPLANT
YANKAUER SUCT BULB TIP NO VENT (SUCTIONS) ×3 IMPLANT

## 2017-08-18 NOTE — Progress Notes (Signed)
Orthopedic Tech Progress Note Patient Details:  Katherine Bell 1951/09/26 [id number]  CPM Left Knee CPM Left Knee: On Left Knee Flexion (Degrees): 90 Left Knee Extension (Degrees): 0 Additional Comments: applied cpm on left knee at 0-90 degrees.  pt tolerated application very well.  provided bone form at bedside.  Left knee   Kristopher Oppenheim 08/18/2017, 1:10 PM

## 2017-08-18 NOTE — Progress Notes (Signed)
Orthopedic Tech Progress Note Patient Details:  Katherine Bell [birth year] [id number] Overhead frame applied. Patient ID: Katherine Bell, female   DOB: [birth year], 66 y.o.   MRN: [id number]   Kristopher Oppenheim 08/18/2017, 1:17 PM

## 2017-08-18 NOTE — Interval H&P Note (Signed)
History and Physical Interval Note:  08/18/2017 7:16 AM  Katherine Bell  has presented today for surgery, with the diagnosis of djd left knee  The various methods of treatment have been discussed with the patient and family. After consideration of risks, benefits and other options for treatment, the patient has consented to  Procedure(s): LEFT TOTAL KNEE ARTHROPLASTY (Left) as a surgical intervention .  The patient's history has been reviewed, patient examined, no change in status, stable for surgery.  I have reviewed the patient's chart and labs.  Questions were answered to the patient's satisfaction.     [name]

## 2017-08-18 NOTE — Anesthesia Preprocedure Evaluation (Addendum)
Anesthesia Evaluation  Patient identified by MRN, date of birth, ID band Patient awake    Reviewed: Allergy & Precautions, NPO status , Patient's Chart, lab work & pertinent test results  Airway Mallampati: II  TM Distance: >3 FB Neck ROM: Full    Dental  (+) Teeth Intact, Dental Advisory Given, Missing   Pulmonary    breath sounds clear to auscultation       Cardiovascular hypertension,  Rhythm:Regular Rate:Normal     Neuro/Psych    GI/Hepatic GERD  Medicated and Controlled,  Endo/Other  diabetesHypothyroidism   Renal/GU      Musculoskeletal  (+) Arthritis , Osteoarthritis,    Abdominal   Peds  Hematology   Anesthesia Other Findings   Reproductive/Obstetrics                            Anesthesia Physical Anesthesia Plan  ASA: III  Anesthesia Plan: Spinal   Post-op Pain Management:  Regional for Post-op pain   Induction: Intravenous  PONV Risk Score and Plan: Ondansetron  Airway Management Planned: Natural Airway and Simple Face Mask  Additional Equipment:   Intra-op Plan:   Post-operative Plan:   Informed Consent: I have reviewed the patients History and Physical, chart, labs and discussed the procedure including the risks, benefits and alternatives for the proposed anesthesia with the patient or authorized representative who has indicated his/her understanding and acceptance.   Dental advisory given  Plan Discussed with: CRNA and Anesthesiologist  Anesthesia Plan Comments:         Anesthesia Quick Evaluation

## 2017-08-18 NOTE — Anesthesia Procedure Notes (Signed)
Spinal  Patient location during procedure: OR Start time: 08/18/2017 9:37 AM End time: 08/18/2017 9:42 AM Staffing Anesthesiologist: Linna Caprice, Blossie Raffel Performed: anesthesiologist  Preanesthetic Checklist Completed: patient identified, site marked, surgical consent, pre-op evaluation, timeout performed, IV checked, risks and benefits discussed and monitors and equipment checked Spinal Block Patient position: sitting Prep: ChloraPrep Patient monitoring: heart rate, cardiac monitor, continuous pulse ox and blood pressure Approach: midline Location: L3-4 Injection technique: single-shot Needle Needle type: Tuohy  Needle gauge: 22 G Needle insertion depth: 76 cm Assessment Sensory level: T6 Additional Notes 14 mg 0.75% Bupivacaine injected easily

## 2017-08-18 NOTE — Interval H&P Note (Signed)
History and Physical Interval Note:  08/18/2017 9:06 AM  Katherine Bell  has presented today for surgery, with the diagnosis of djd left knee  The various methods of treatment have been discussed with the patient and family. After consideration of risks, benefits and other options for treatment, the patient has consented to  Procedure(s): LEFT TOTAL KNEE ARTHROPLASTY (Left) as a surgical intervention .  The patient's history has been reviewed, patient examined, no change in status, stable for surgery.  I have reviewed the patient's chart and labs.  Questions were answered to the patient's satisfaction.     [name]

## 2017-08-18 NOTE — Transfer of Care (Signed)
Immediate Anesthesia Transfer of Care Note  Patient: Katherine Bell  Procedure(s) Performed: LEFT TOTAL KNEE ARTHROPLASTY (Left Knee)  Patient Location: PACU  Anesthesia Type:MAC combined with regional for post-op pain  Level of Consciousness: awake, alert  and oriented  Airway & Oxygen Therapy: Patient Spontanous Breathing  Post-op Assessment: Report given to RN and Post -op Vital signs reviewed and stable  Post vital signs: Reviewed and stable  Last Vitals:  Vitals:   08/18/17 0845 08/18/17 0850  BP: 137/69 127/63  Pulse: 95 95  Resp: 14 15  Temp:    SpO2: 97% 98%    Last Pain:  Vitals:   08/18/17 0651  TempSrc: Oral         Complications: No apparent anesthesia complications

## 2017-08-18 NOTE — Anesthesia Postprocedure Evaluation (Signed)
Anesthesia Post Note  Patient: Katherine Bell  Procedure(s) Performed: LEFT TOTAL KNEE ARTHROPLASTY (Left Knee)     Patient location during evaluation: PACU Anesthesia Type: Spinal Level of consciousness: awake, awake and alert and oriented Pain management: pain level controlled Vital Signs Assessment: post-procedure vital signs reviewed and stable Respiratory status: spontaneous breathing, nonlabored ventilation and respiratory function stable Cardiovascular status: blood pressure returned to baseline Anesthetic complications: no    Last Vitals:  Vitals:   08/18/17 1245 08/18/17 1300  BP: 118/71 128/69  Pulse: 99 100  Resp: 14 12  Temp:    SpO2: 98% 97%    Last Pain:  Vitals:   08/18/17 1230  TempSrc:   PainSc: 8                  Mckynna Vanloan COKER

## 2017-08-18 NOTE — Evaluation (Signed)
Physical Therapy Evaluation Patient Details Name: Katherine Bell MRN: [id number] DOB: 1951/03/15 Today's Date: 08/18/2017   History of Present Illness  Pt is a 66 y/o female s/p elective L TKA. PMH includes HTN, DM, OSA, and L corneal ulcer.   Clinical Impression  Pt s/p surgery above with deficits below. PTA, pt was independent with functional mobility, but reports she held on to her husband a lot because she felt off balance. Upon eval, pt limited by post op pain and weakness, and decreased balance. Limited gait distance to chair this session secondary to increased pain and required min A for mobility. Reports husband will be able to assist at home and has all necessary DME. Follow up recommendations per MD arrangements. Will continue to follow acutely to maximize functional mobility independence and safety.     Follow Up Recommendations DC plan and follow up therapy as arranged by surgeon;Supervision for mobility/OOB    Equipment Recommendations  None recommended by PT    Recommendations for Other Services       Precautions / Restrictions Precautions Precautions: Knee Precaution Booklet Issued: Yes (comment) Precaution Comments: Reviewed supine ther ex with pt.  Required Braces or Orthoses: Knee Immobilizer - Left Knee Immobilizer - Left: Other (comment) (until discontinued ) Restrictions Weight Bearing Restrictions: Yes LLE Weight Bearing: Weight bearing as tolerated      Mobility  Bed Mobility Overal bed mobility: Needs Assistance Bed Mobility: Supine to Sit     Supine to sit: Min guard     General bed mobility comments: Min guard for safety.   Transfers Overall transfer level: Needs assistance Equipment used: Rolling walker (2 wheeled) Transfers: Sit to/from Stand Sit to Stand: Min assist         General transfer comment: Min A for lift assist and steadying assist. Verbal cues for safe hand placement.   Ambulation/Gait Ambulation/Gait assistance: Min  assist Ambulation Distance (Feet): 5 Feet Assistive device: Rolling walker (2 wheeled) Gait Pattern/deviations: Step-to pattern;Decreased step length - right;Decreased step length - left;Decreased weight shift to left;Antalgic Gait velocity: Decreased Gait velocity interpretation: Below normal speed for age/gender General Gait Details: SLow, antalgic gait secondary to post op pain and weakness. Verbal cues for sequencing with RW. Distance limited secondary to pain.   Stairs            Wheelchair Mobility    Modified Rankin (Stroke Patients Only)       Balance Overall balance assessment: Needs assistance Sitting-balance support: No upper extremity supported;Feet supported Sitting balance-Leahy Scale: Good     Standing balance support: Bilateral upper extremity supported;During functional activity Standing balance-Leahy Scale: Poor Standing balance comment: Reliant on UE support for balance                              Pertinent Vitals/Pain Pain Assessment: 0-10 Pain Score: 6  Pain Location: L knee  Pain Descriptors / Indicators: Aching;Operative site guarding Pain Intervention(s): Limited activity within patient's tolerance;Monitored during session;Repositioned    Home Living Family/patient expects to be discharged to:: Private residence Living Arrangements: Spouse/significant other Available Help at Discharge: Family;Available 24 hours/day Type of Home: House Home Access: Stairs to enter   [employer] of Steps: 2 (threshold steps ) Home Layout: One level Home Equipment: Bedside commode;Walker - 2 wheels      Prior Function Level of Independence: Independent               Hand Dominance  Dominant Hand: Right    Extremity/Trunk Assessment   Upper Extremity Assessment Upper Extremity Assessment: Defer to OT evaluation    Lower Extremity Assessment Lower Extremity Assessment: LLE deficits/detail LLE Deficits / Details:  Sensory in tact. Deficits consistent with post op pain and weakness. Able to perform ther ex below.     Cervical / Trunk Assessment Cervical / Trunk Assessment: Normal  Communication   Communication: No difficulties  Cognition Arousal/Alertness: Awake/alert Behavior During Therapy: WFL for tasks assessed/performed Overall Cognitive Status: Within Functional Limits for tasks assessed                                        General Comments General comments (skin integrity, edema, etc.): Educated about importance of mobility and moving LLE to prevent stiffness and to decrease pain.     Exercises Total Joint Exercises Ankle Circles/Pumps: AROM;Both;20 reps Quad Sets: AROM;Left;10 reps Towel Squeeze: AROM;Left;10 reps Short Arc Quad: AROM;Left;10 reps Heel Slides: AROM;Left;10 reps Hip ABduction/ADduction: AROM;Left;10 reps   Assessment/Plan    PT Assessment Patient needs continued PT services  PT Problem List Decreased strength;Decreased range of motion;Decreased activity tolerance;Decreased balance;Decreased mobility;Decreased knowledge of use of DME;Decreased knowledge of precautions;Pain       PT Treatment Interventions DME instruction;Gait training;Stair training;Functional mobility training;Therapeutic activities;Therapeutic exercise;Balance training;Neuromuscular re-education;Patient/family education    PT Goals (Current goals can be found in the Care Plan section)  Acute Rehab PT Goals Patient Stated Goal: to decrease pain  PT Goal Formulation: With patient Time For Goal Achievement: 08/25/17 Potential to Achieve Goals: Good    Frequency 7X/week   Barriers to discharge        Co-evaluation               AM-PAC PT "6 Clicks" Daily Activity  Outcome Measure Difficulty turning over in bed (including adjusting bedclothes, sheets and blankets)?: A Little Difficulty moving from lying on back to sitting on the side of the bed? : A  Little Difficulty sitting down on and standing up from a chair with arms (e.g., wheelchair, bedside commode, etc,.)?: Unable Help needed moving to and from a bed to chair (including a wheelchair)?: A Little Help needed walking in hospital room?: A Little Help needed climbing 3-5 steps with a railing? : A Lot 6 Click Score: 15    End of Session Equipment Utilized During Treatment: Gait belt;Left knee immobilizer Activity Tolerance: Patient limited by pain Patient left: in chair;with call bell/phone within reach Nurse Communication: Mobility status;Patient requests pain meds PT Visit Diagnosis: Other abnormalities of gait and mobility (R26.89);Pain Pain - Right/Left: Left Pain - part of body: Knee    Time: 9030-0923 PT Time Calculation (min) (ACUTE ONLY): 23 min   Charges:   PT Evaluation $PT Eval Low Complexity: 1 Low PT Treatments $Therapeutic Exercise: 8-22 mins   PT G Codes:        Leighton Ruff, PT, DPT  Acute Rehabilitation Services  Pager: [phone]   Rudean Hitt 08/18/2017, 6:45 PM

## 2017-08-18 NOTE — Op Note (Signed)
MRN:     [id number] DOB/AGE:    05-03-1951 / 66 y.o.       OPERATIVE REPORT    DATE OF PROCEDURE:  08/18/2017       PREOPERATIVE DIAGNOSIS:   Primary localized OA left knee      Estimated body mass index is 29.13 kg/m as calculated from the following:   Height as of 08/08/17: 5\' 7"  (1.702 m).   Weight as of this encounter: 84.4 kg (186 lb).                                                        POSTOPERATIVE DIAGNOSIS:   same                                                                    PROCEDURE:  Procedure(s): LEFT TOTAL KNEE ARTHROPLASTY Using Depuy Attune RP implants #6 Femur, #6Tibia, 36mm  RP bearing, 32 Patella     SURGEON: Danisha Brassfield A    ASSISTANT:  Kirstin Shepperson PA-C   (Present and scrubbed throughout the case, critical for assistance with exposure, retraction, instrumentation, and closure.)         ANESTHESIA: Spinal with Adductor Nerve Block     TOURNIQUET TIME: 17OHY   COMPLICATIONS:  None     SPECIMENS: None   INDICATIONS FOR PROCEDURE: The patient has  djd left knee, varus deformities, XR shows bone on bone arthritis. Patient has failed all conservative measures including anti-inflammatory medicines, narcotics, attempts at  exercise and weight loss, cortisone injections and viscosupplementation.  Risks and benefits of surgery have been discussed, questions answered.   DESCRIPTION OF PROCEDURE: The patient identified by armband, received  right femoral nerve block and IV antibiotics, in the holding area at [facility]. Patient taken to the operating room, appropriate anesthetic  monitors were attached Spinal anesthesia induced with  the patient in supine position, Foley catheter was inserted. Tourniquet  applied high to the operative thigh. Lateral post and foot positioner  applied to the table, the lower extremity was then prepped and draped  in usual sterile fashion from the ankle to the tourniquet. Time-out procedure was performed. The limb  was wrapped with an Esmarch bandage and the tourniquet inflated to 365 mmHg. We began the operation by making the anterior midline incision starting at handbreadth above the patella going over the patella 1 cm medial to and  4 cm distal to the tibial tubercle. Small bleeders in the skin and the  subcutaneous tissue identified and cauterized. Transverse retinaculum was incised and reflected medially and a medial parapatellar arthrotomy was accomplished. the patella was everted and theprepatellar fat pad resected. The superficial medial collateral  ligament was then elevated from anterior to posterior along the proximal  flare of the tibia and anterior half of the menisci resected. The knee was hyperflexed exposing bone on bone arthritis. Peripheral and notch osteophytes as well as the cruciate ligaments were then resected. We continued to  work our way around posteriorly along the proximal tibia, and externally  rotated the tibia subluxing it out from underneath  the femur. A McHale  retractor was placed through the notch and a lateral Hohmann retractor  placed, and we then drilled through the proximal tibia in line with the  axis of the tibia followed by an intramedullary guide rod and 2-degree  posterior slope cutting guide. The tibial cutting guide was pinned into place  allowing resection of 4 mm of bone medially and about 6 mm of bone  laterally because of her varus deformity. Satisfied with the tibial resection, we then  entered the distal femur 2 mm anterior to the PCL origin with the  intramedullary guide rod and applied the distal femoral cutting guide  set at 91mm, with 5 degrees of valgus. This was pinned along the  epicondylar axis. At this point, the distal femoral cut was accomplished without difficulty. We then sized for a #6 femoral component and pinned the guide in 3 degrees of external rotation.The chamfer cutting guide was pinned into place. The anterior, posterior, and chamfer cuts  were accomplished without difficulty followed by  the  RP box cutting guide and the box cut. We also removed posterior osteophytes from the posterior femoral condyles. At this  time, the knee was brought into full extension. We checked our  extension and flexion gaps and found them symmetric at 21mm.  The patella thickness measured at 25 mm. We set the cutting guide at 15 and removed the posterior 9.5-10 mm  of the patella sized for 32 button and drilled the lollipop. The knee  was then once again hyperflexed exposing the proximal tibia. We sized for a #6 tibial base plate, applied the smokestack and the conical reamer followed by the the Delta fin keel punch. We then hammered into place the  RP trial femoral component, inserted a 1 trial bearing, trial patellar button, and took the knee through range of motion from 0-130 degrees. No thumb pressure was required for patellar  tracking. At this point, all trial components were removed, a double batch of DePuy HV cement  was mixed and applied to all bony metallic mating surfaces except for the posterior condyles of the femur itself. In order, we  hammered into place the tibial tray and removed excess cement, the femoral component and removed excess cement, a 1mm  RP bearing  was inserted, and the knee brought to full extension with compression.  The patellar button was clamped into place, and excess cement  removed. While the cement cured the wound was irrigated out with normal saline solution pulse lavage.. Ligament stability and patellar tracking were checked and found to be excellent.. The parapatellar arthrotomy was closed with  #1 Vicryl suture. The subcutaneous tissue with 0 and 2-0 undyed  Vicryl suture, and 4-0 Monocryl.. A dressing of Aquaseal,  4 x 4, dressing sponges, Webril, and Ace wrap applied. Needle and sponge count were correct times 2.The patient awakened, extubated, and taken to recovery room without difficulty. Vascular status was  normal, pulses 2+ and symmetric.   Chiann Goffredo A 08/18/2017, 11:15 AM

## 2017-08-19 ENCOUNTER — Encounter (HOSPITAL_COMMUNITY): Payer: Self-pay | Admitting: Physician Assistant

## 2017-08-19 DIAGNOSIS — D62 Acute posthemorrhagic anemia: Secondary | ICD-10-CM

## 2017-08-19 DIAGNOSIS — D638 Anemia in other chronic diseases classified elsewhere: Secondary | ICD-10-CM

## 2017-08-19 HISTORY — DX: Anemia in other chronic diseases classified elsewhere: D63.8

## 2017-08-19 HISTORY — DX: Acute posthemorrhagic anemia: D62

## 2017-08-19 LAB — CBC
HEMATOCRIT: 27.6 % — AB (ref 36.0–46.0)
HEMOGLOBIN: 8.8 g/dL — AB (ref 12.0–15.0)
MCH: 23.6 pg — AB (ref 26.0–34.0)
MCHC: 31.9 g/dL (ref 30.0–36.0)
MCV: 74 fL — ABNORMAL LOW (ref 78.0–100.0)
Platelets: 114 10*3/uL — ABNORMAL LOW (ref 150–400)
RBC: 3.73 MIL/uL — AB (ref 3.87–5.11)
RDW: 15.1 % (ref 11.5–15.5)
WBC: 5.6 10*3/uL (ref 4.0–10.5)

## 2017-08-19 LAB — BASIC METABOLIC PANEL
Anion gap: 8 (ref 5–15)
BUN: 9 mg/dL (ref 6–20)
CHLORIDE: 104 mmol/L (ref 101–111)
CO2: 25 mmol/L (ref 22–32)
CREATININE: 0.77 mg/dL (ref 0.44–1.00)
Calcium: 8.2 mg/dL — ABNORMAL LOW (ref 8.9–10.3)
GFR calc non Af Amer: >60 mL/min (ref >60)
Glucose, Bld: 235 mg/dL — ABNORMAL HIGH (ref 65–99)
POTASSIUM: 4.7 mmol/L (ref 3.5–5.1)
Sodium: 137 mmol/L (ref 135–145)

## 2017-08-19 LAB — GLUCOSE, CAPILLARY
GLUCOSE-CAPILLARY: 183 mg/dL — AB (ref 65–99)
Glucose-Capillary: 242 mg/dL — ABNORMAL HIGH (ref 65–99)
Glucose-Capillary: 289 mg/dL — ABNORMAL HIGH (ref 65–99)
Glucose-Capillary: 260 mg/dL — ABNORMAL HIGH (ref 65–99)

## 2017-08-19 MED ORDER — OXYCODONE HCL 5 MG PO TABS: 5.0000 mg | ORAL_TABLET | ORAL | 0 refills | Status: DC | PRN

## 2017-08-19 MED ORDER — DOCUSATE SODIUM 100 MG PO CAPS: ORAL_CAPSULE | ORAL | 0 refills | Status: DC

## 2017-08-19 MED ORDER — POLYETHYLENE GLYCOL 3350 17 G PO PACK: PACK | ORAL | 0 refills | Status: DC

## 2017-08-19 MED ORDER — ASPIRIN 325 MG PO TBEC: DELAYED_RELEASE_TABLET | ORAL | 0 refills | Status: DC

## 2017-08-19 MED ORDER — ACETAMINOPHEN 325 MG PO TABS: 650.0000 mg | ORAL_TABLET | Freq: Four times a day (QID) | ORAL | Status: DC | PRN

## 2017-08-19 NOTE — Progress Notes (Signed)
Subjective: 1 Day Post-Op Procedure(s) (LRB): LEFT TOTAL KNEE ARTHROPLASTY (Left) Patient reports pain as 6 on 0-10 scale.    Objective: Vital signs in last 24 hours: Temp:  [98 F (36.7 C)-98.3 F (36.8 C)] 98 F (36.7 C) (10/16 8921) Pulse Rate:  [78-105] 78 (10/16 0608) Resp:  [11-21] 15 (10/16 0608) BP: (115-149)/(65-85) 126/68 (10/16 0608) SpO2:  [96 %-99 %] 98 % (10/16 0608)  Intake/Output from previous day: 10/15 0701 - 10/16 0700 In: 3740 [P.O.:240; I.V.:3500] Out: 2925 [Urine:2775; Blood:150] Intake/Output this shift: No intake/output data recorded.   Recent Labs  08/19/17 0655  HGB 8.8*    Recent Labs  08/19/17 0655  WBC 5.6  RBC 3.73*  HCT 27.6*  PLT 114*    Recent Labs  08/19/17 0655  NA 137  K 4.7  CL 104  CO2 25  BUN 9  CREATININE 0.77  GLUCOSE 235*  CALCIUM 8.2*   No results for input(s): LABPT, INR in the last 72 hours.  ABD soft Neurovascular intact Sensation intact distally Intact pulses distally Dorsiflexion/Plantar flexion intact Incision: dressing C/D/I  Assessment/Plan: 1 Day Post-Op Procedure(s) (LRB): LEFT TOTAL KNEE ARTHROPLASTY (Left)  Principal Problem:   Primary localized osteoarthrosis of the knee, left Active Problems:   Sleep apnea, obstructive   Osteoarthritis of left knee   Hypothyroidism   Hypertension   Hypercholesteremia   GERD (gastroesophageal reflux disease)   Diabetes mellitus without complication (HCC)   Corneal ulcer, left   Anxiety   Acute blood loss as cause of postoperative anemia   Anemia of chronic disease  Will watch hemoglobin and symptoms.  She did well walking today with minimal symptoms of her post op blood loss anemia.  Will watch how she does tomorrow to see if she needs a transfusion Advance diet Up with therapy Plan for discharge tomorrow Discharge home with home health  Linda Hedges 08/19/2017, 9:36 AM

## 2017-08-19 NOTE — Progress Notes (Signed)
Physical Therapy Treatment Patient Details Name: Katherine Bell MRN: [id number] DOB: [birth year] Today's Date: 08/19/2017    History of Present Illness Pt is a 66 y/o female s/p elective L TKA. PMH includes HTN, DM, OSA, and L corneal ulcer.     PT Comments    Pt on arrival in tears due to increased pain.  Pt presents with fever and incentive spirometer encouraged.  Pt required cues for technique to use incentive spirometer correctly.  Pt performed 1x10 reps with 741ml-1000ml quality.  Pt educated to performed incentive spirometer 10 times/hour to encourage expansion of lungs.  Pt administer IV pain meds during session from RN.  Pt reports reduced pain and able to tolerated supine and seated exercises and gait training.  Plan next session for stair training.     Follow Up Recommendations  DC plan and follow up therapy as arranged by surgeon;Supervision for mobility/OOB     Equipment Recommendations  None recommended by PT    Recommendations for Other Services       Precautions / Restrictions Precautions Precautions: Knee Precaution Booklet Issued: Yes (comment) Precaution Comments: Reviewed supine ther ex with pt.  Required Braces or Orthoses: Knee Immobilizer - Left Restrictions Weight Bearing Restrictions: Yes LLE Weight Bearing: Weight bearing as tolerated    Mobility  Bed Mobility               General bed mobility comments: Pt sitting on commode on arrival in tears due to pain.    Transfers Overall transfer level: Needs assistance Equipment used: Rolling walker (2 wheeled) Transfers: Sit to/from Stand Sit to Stand: Min guard         General transfer comment: Pt required cues for hand placement.  Cues to progress L knee forward due to pain.    Ambulation/Gait Ambulation/Gait assistance: Min guard;Min assist Ambulation Distance (Feet): 200 Feet (15 ft from commode back to chair followed by additonal 200 ft in halls.  ) Assistive device: Rolling walker (2  wheeled) Gait Pattern/deviations: Step-to pattern;Decreased step length - right;Decreased step length - left;Decreased stride length;Antalgic Gait velocity: Decreased Gait velocity interpretation: Below normal speed for age/gender General Gait Details: Pt remains slow and guarded requring cues for RW safety.  Pt performed 15 ft back from bathroom to the recliner chair.  Once in sitting patient given IV meds for pain and able to tolerate 200 ft of gait in halls.  Pt remains limited and continues to ambulate with step to pattern.    Stairs            Wheelchair Mobility    Modified Rankin (Stroke Patients Only)       Balance Overall balance assessment: Needs assistance   Sitting balance-Leahy Scale: Good       Standing balance-Leahy Scale: Poor Standing balance comment: Reliant on UE support for balance, Pt with LOB when removing hand from RW to attempt pericare.  Required min assist to correct LOB.                              Cognition Arousal/Alertness: Awake/alert Behavior During Therapy: WFL for tasks assessed/performed Overall Cognitive Status: Within Functional Limits for tasks assessed                                        Exercises Total Joint Exercises Ankle Circles/Pumps: AROM;Both;20 reps;Supine  Quad Sets: AROM;Left;10 reps;Supine Towel Squeeze: AROM;Left;10 reps;Supine Short Arc Quad: AROM;Left;10 reps;Supine Heel Slides: AROM;Left;10 reps;Supine Hip ABduction/ADduction: AROM;Left;10 reps;Supine Straight Leg Raises: AROM;Left;10 reps;Supine Long Arc Quad: AROM;Left;10 reps;Supine    General Comments        Pertinent Vitals/Pain Pain Assessment: 0-10 Pain Score: 10-Worst pain ever (reduced to 3/10 post administration of dilaudid.  ) Pain Location: L knee Pain Descriptors / Indicators: Aching;Operative site guarding Pain Intervention(s): Monitored during session;Repositioned;Ice applied    Home Living                       Prior Function            PT Goals (current goals can now be found in the care plan section) Acute Rehab PT Goals Patient Stated Goal: to go home Potential to Achieve Goals: Good Progress towards PT goals: Progressing toward goals    Frequency    7X/week      PT Plan Current plan remains appropriate    Co-evaluation              AM-PAC PT "6 Clicks" Daily Activity  Outcome Measure  Difficulty turning over in bed (including adjusting bedclothes, sheets and blankets)?: A Little Difficulty moving from lying on back to sitting on the side of the bed? : A Little Difficulty sitting down on and standing up from a chair with arms (e.g., wheelchair, bedside commode, etc,.)?: Unable Help needed moving to and from a bed to chair (including a wheelchair)?: A Little Help needed walking in hospital room?: A Little Help needed climbing 3-5 steps with a railing? : A Little 6 Click Score: 16    End of Session Equipment Utilized During Treatment: Gait belt Activity Tolerance: Patient limited by pain Patient left: in chair;with call bell/phone within reach Nurse Communication: Mobility status;Patient requests pain meds PT Visit Diagnosis: Other abnormalities of gait and mobility (R26.89);Pain Pain - Right/Left: Left Pain - part of body: Knee     Time: 8182-9937 PT Time Calculation (min) (ACUTE ONLY): 36 min  Charges:  $Gait Training: 8-22 mins $Therapeutic Exercise: 8-22 mins                    G Codes:       Governor Rooks, PTA pager [phone]    Cristela Blue 08/19/2017, 4:04 PM

## 2017-08-19 NOTE — Progress Notes (Signed)
Physical Therapy Treatment Patient Details Name: Katherine Bell MRN: [id number] DOB: Sep 23, 1951 Today's Date: 08/19/2017    History of Present Illness Pt is a 66 y/o female s/p elective L TKA. PMH includes HTN, DM, OSA, and L corneal ulcer.     PT Comments    Pt performed increased gait and reviewed safety with RW during transfers and gait training.  Pt in increased pain during gait and return to bed. RN into administer pain meds and ice pack placed to L knee.  Pt in bone foam resting.  Will f/u this pm for progression of gait training and therapeutic exercises.  Deferred therapeutic exercises this am due to pain.     Follow Up Recommendations  DC plan and follow up therapy as arranged by surgeon;Supervision for mobility/OOB     Equipment Recommendations  None recommended by PT    Recommendations for Other Services       Precautions / Restrictions Precautions Precautions: Knee Precaution Booklet Issued: Yes (comment) Precaution Comments: Reviewed supine ther ex with pt.  Required Braces or Orthoses: Knee Immobilizer - Left Knee Immobilizer - Left: Other (comment) (until discontinued, PA this am did not use brace.  Pt is able to do a SLR independently.  ) Restrictions Weight Bearing Restrictions: Yes LLE Weight Bearing: Weight bearing as tolerated    Mobility  Bed Mobility Overal bed mobility: Needs Assistance Bed Mobility: Sit to Supine       Sit to supine: Supervision   General bed mobility comments: Pt able to lift B LEs into bed against gravity without physical assistance.  PTA used tactile cueing for positioning in bed.    Transfers Overall transfer level: Needs assistance Equipment used: Rolling walker (2 wheeled) Transfers: Sit to/from Stand Sit to Stand: Min guard         General transfer comment: Cues for hand placement to and from seated surface.  Pt required cues to keep L knee flexed when descending to seated surface.   Ambulation/Gait Ambulation/Gait  assistance: Min guard Ambulation Distance (Feet): 200 Feet Assistive device: Rolling walker (2 wheeled) Gait Pattern/deviations: Step-to pattern;Decreased step length - right;Decreased step length - left;Decreased stride length;Antalgic Gait velocity: Decreased Gait velocity interpretation: Below normal speed for age/gender General Gait Details: Pt remains slow and guarded requring cues for RW safety.  Cues to push through RW with B UEs when placing weight on surgical limb to reduce pain in stance phase.  Pt unable to tolerate progression to step through pattern due to pain.     Stairs            Wheelchair Mobility    Modified Rankin (Stroke Patients Only)       Balance Overall balance assessment: Needs assistance   Sitting balance-Leahy Scale: Good       Standing balance-Leahy Scale: Poor Standing balance comment: Reliant on UE support for balance                             Cognition Arousal/Alertness: Awake/alert Behavior During Therapy: WFL for tasks assessed/performed Overall Cognitive Status: Within Functional Limits for tasks assessed                                        Exercises Total Joint Exercises Goniometric ROM: grossly 5-85  degrees L knee.      General Comments  Pertinent Vitals/Pain Pain Assessment: 0-10 Pain Score: 8  Pain Location: L knee  Pain Descriptors / Indicators: Aching;Operative site guarding Pain Intervention(s): Monitored during session;Repositioned;Ice applied    Home Living                      Prior Function            PT Goals (current goals can now be found in the care plan section) Acute Rehab PT Goals Patient Stated Goal: to decrease pain  Potential to Achieve Goals: Good Progress towards PT goals: Progressing toward goals    Frequency    7X/week      PT Plan Current plan remains appropriate    Co-evaluation              AM-PAC PT "6 Clicks" Daily  Activity  Outcome Measure  Difficulty turning over in bed (including adjusting bedclothes, sheets and blankets)?: A Little Difficulty moving from lying on back to sitting on the side of the bed? : A Little Difficulty sitting down on and standing up from a chair with arms (e.g., wheelchair, bedside commode, etc,.)?: Unable Help needed moving to and from a bed to chair (including a wheelchair)?: A Little Help needed walking in hospital room?: A Little Help needed climbing 3-5 steps with a railing? : A Little 6 Click Score: 16    End of Session Equipment Utilized During Treatment: Gait belt Activity Tolerance: Patient limited by pain Patient left: in chair;with call bell/phone within reach Nurse Communication: Mobility status;Patient requests pain meds PT Visit Diagnosis: Other abnormalities of gait and mobility (R26.89);Pain Pain - Right/Left: Left Pain - part of body: Knee     Time: 1884-1660 PT Time Calculation (min) (ACUTE ONLY): 28 min  Charges:  $Gait Training: 8-22 mins $Therapeutic Activity: 8-22 mins                    G Codes:       Governor Rooks, PTA pager [phone]    Cristela Blue 08/19/2017, 10:42 AM

## 2017-08-19 NOTE — Evaluation (Signed)
Occupational Therapy Evaluation Patient Details Name: Katherine Bell MRN: [id number] DOB: [birth year] Today's Date: 08/19/2017    History of Present Illness Pt is a 66 y/o female s/p elective L TKA. PMH includes HTN, DM, OSA, and L corneal ulcer.    Clinical Impression   Pt. Was seen for skilled OT to maximize I and safety with ADLs and mobility. Pt. Requires skilled OT to further educate pt. On LE ADLs with use of AE. Pt. Requires further training for increasing safety and I with ADL transfers. Pt. Plans to d/c home with husband to assist.     Follow Up Recommendations  Home health OT    Equipment Recommendations  None recommended by OT    Recommendations for Other Services       Precautions / Restrictions Precautions Precautions: Knee Precaution Booklet Issued: Yes (comment) Precaution Comments: Reviewed supine ther ex with pt.  Required Braces or Orthoses: Knee Immobilizer - Left Knee Immobilizer - Left: Other (comment) (until discontinued, PA this am did not use brace.  Pt is able to do a SLR independently.  ) Restrictions Weight Bearing Restrictions: Yes LLE Weight Bearing: Weight bearing as tolerated      Mobility Bed Mobility Overal bed mobility: Needs Assistance Bed Mobility: Sit to Supine     Supine to sit: Supervision Sit to supine: Supervision   General bed mobility comments: Pt able to lift B LEs into bed against gravity without physical assistance.  PTA used tactile cueing for positioning in bed.    Transfers Overall transfer level: Needs assistance Equipment used: Rolling walker (2 wheeled) Transfers: Sit to/from Stand Sit to Stand: Min guard         General transfer comment: cues for proper hand placement    Balance Overall balance assessment: Needs assistance   Sitting balance-Leahy Scale: Good       Standing balance-Leahy Scale: Poor Standing balance comment: Reliant on UE support for balance                            ADL  either performed or assessed with clinical judgement   ADL Overall ADL's : Needs assistance/impaired Eating/Feeding: Independent   Grooming: Wash/dry hands;Wash/dry face;Supervision/safety;Set up;Sitting   Upper Body Bathing: Supervision/ safety;Set up;Sitting   Lower Body Bathing: Minimal assistance;Moderate assistance;Sit to/from stand   Upper Body Dressing : Supervision/safety;Set up;Sitting   Lower Body Dressing: Moderate assistance;Maximal assistance;Sit to/from stand   Toilet Transfer: Minimal assistance;BSC;RW   Toileting- Clothing Manipulation and Hygiene: Minimal assistance;Sit to/from stand       Functional mobility during ADLs: Minimal assistance General ADL Comments: Pt. was educated on use of AE for LE ADLs but will need addiitional training.      Vision Baseline Vision/History: Wears glasses Wears Glasses: Reading only       Perception     Praxis      Pertinent Vitals/Pain Pain Assessment: 0-10 Pain Score: 8  Pain Location: l knee Pain Descriptors / Indicators: Aching;Operative site guarding Pain Intervention(s): Limited activity within patient's tolerance;Monitored during session;Premedicated before session     Hand Dominance Right   Extremity/Trunk Assessment Upper Extremity Assessment Upper Extremity Assessment: Generalized weakness           Communication Communication Communication: No difficulties   Cognition Arousal/Alertness: Awake/alert Behavior During Therapy: WFL for tasks assessed/performed Overall Cognitive Status: Within Functional Limits for tasks assessed  General Comments       Exercises Total Joint Exercises Goniometric ROM: grossly 5-85  degrees L knee.     Shoulder Instructions      Home Living Family/patient expects to be discharged to:: Private residence Living Arrangements: Spouse/significant other Available Help at Discharge: Family;Available 24  hours/day Type of Home: House Home Access: Stairs to enter [employer] of Steps: 2 (threshold steps )   Home Layout: One level     Bathroom Shower/Tub: Occupational psychologist: Handicapped height     Home Equipment: Environmental consultant - 2 wheels;Bedside commode          Prior Functioning/Environment Level of Independence: Independent                 OT Problem List: Decreased knowledge of use of DME or AE;Pain      OT Treatment/Interventions: Self-care/ADL training;DME and/or AE instruction;Therapeutic activities;Patient/family education    OT Goals(Current goals can be found in the care plan section) Acute Rehab OT Goals Patient Stated Goal: to go home OT Goal Formulation: With patient Time For Goal Achievement: 09/02/17 Potential to Achieve Goals: Good  OT Frequency: Min 2X/week   Barriers to D/C:            Co-evaluation              AM-PAC PT "6 Clicks" Daily Activity     Outcome Measure Help from another person eating meals?: None Help from another person taking care of personal grooming?: A Little Help from another person toileting, which includes using toliet, bedpan, or urinal?: A Little Help from another person bathing (including washing, rinsing, drying)?: A Little Help from another person to put on and taking off regular upper body clothing?: A Little Help from another person to put on and taking off regular lower body clothing?: A Little 6 Click Score: 19   End of Session Equipment Utilized During Treatment: Gait belt;Rolling walker CPM Left Knee CPM Left Knee: Off  Activity Tolerance: Patient tolerated treatment well Patient left: in bed;with call bell/phone within reach  OT Visit Diagnosis: Unsteadiness on feet (R26.81);Other abnormalities of gait and mobility (R26.89)                Time: 4496-7591 OT Time Calculation (min): 41 min Charges:  OT General Charges $OT Visit: 1 Visit OT Evaluation $OT Eval Low Complexity: 1  Low OT Treatments $Self Care/Home Management : 8-22 mins G-Codes:     6 clicks  Dillan Candela 08/19/2017, 1:03 PM

## 2017-08-20 ENCOUNTER — Inpatient Hospital Stay (HOSPITAL_COMMUNITY): Payer: Medicare Other

## 2017-08-20 LAB — BASIC METABOLIC PANEL
ANION GAP: 10 (ref 5–15)
BUN: 12 mg/dL (ref 6–20)
CALCIUM: 8.5 mg/dL — AB (ref 8.9–10.3)
CO2: 25 mmol/L (ref 22–32)
Chloride: 102 mmol/L (ref 101–111)
Creatinine, Ser: 0.81 mg/dL (ref 0.44–1.00)
Glucose, Bld: 235 mg/dL — ABNORMAL HIGH (ref 65–99)
Potassium: 4.7 mmol/L (ref 3.5–5.1)
SODIUM: 137 mmol/L (ref 135–145)

## 2017-08-20 LAB — GLUCOSE, CAPILLARY
GLUCOSE-CAPILLARY: 178 mg/dL — AB (ref 65–99)
Glucose-Capillary: 224 mg/dL — ABNORMAL HIGH (ref 65–99)
Glucose-Capillary: 151 mg/dL — ABNORMAL HIGH (ref 65–99)

## 2017-08-20 LAB — CBC WITH DIFFERENTIAL/PLATELET
Basophils Absolute: 0 10*3/uL (ref 0.0–0.1)
Basophils Relative: 0 %
EOS PCT: 1 %
Eosinophils Absolute: 0 10*3/uL (ref 0.0–0.7)
HCT: 23.3 % — ABNORMAL LOW (ref 36.0–46.0)
Hemoglobin: 7.4 g/dL — ABNORMAL LOW (ref 12.0–15.0)
Lymphocytes Relative: 19 %
Lymphs Abs: 0.7 10*3/uL (ref 0.7–4.0)
MCH: 23.8 pg — AB (ref 26.0–34.0)
MCHC: 31.8 g/dL (ref 30.0–36.0)
MCV: 74.9 fL — AB (ref 78.0–100.0)
MONOS PCT: 6 %
Monocytes Absolute: 0.2 10*3/uL (ref 0.1–1.0)
NEUTROS PCT: 75 %
Neutro Abs: 2.7 10*3/uL (ref 1.7–7.7)
PLATELETS: 87 10*3/uL — AB (ref 150–400)
RBC: 3.11 MIL/uL — ABNORMAL LOW (ref 3.87–5.11)
RDW: 15.2 % (ref 11.5–15.5)
WBC: 3.7 10*3/uL — AB (ref 4.0–10.5)

## 2017-08-20 LAB — CBC
HCT: 29 % — ABNORMAL LOW (ref 36.0–46.0)
Hemoglobin: 9.2 g/dL — ABNORMAL LOW (ref 12.0–15.0)
MCH: 23.8 pg — ABNORMAL LOW (ref 26.0–34.0)
MCHC: 31.7 g/dL (ref 30.0–36.0)
MCV: 74.9 fL — ABNORMAL LOW (ref 78.0–100.0)
PLATELETS: 192 10*3/uL (ref 150–400)
RBC: 3.87 MIL/uL (ref 3.87–5.11)
RDW: 15.2 % (ref 11.5–15.5)
WBC: 9.2 10*3/uL (ref 4.0–10.5)

## 2017-08-20 LAB — URINALYSIS, ROUTINE W REFLEX MICROSCOPIC
BILIRUBIN URINE: NEGATIVE
Glucose, UA: NEGATIVE mg/dL
Hgb urine dipstick: NEGATIVE
KETONES UR: NEGATIVE mg/dL
Leukocytes, UA: NEGATIVE
NITRITE: NEGATIVE
PH: 6 (ref 5.0–8.0)
Protein, ur: NEGATIVE mg/dL
Specific Gravity, Urine: 1.002 — ABNORMAL LOW (ref 1.005–1.030)

## 2017-08-20 NOTE — Progress Notes (Signed)
Pt c/o feeling warm,T100.0 oral.  Call placed to notify primary team.  AKingRNBSN

## 2017-08-20 NOTE — Progress Notes (Signed)
Patient ID: Katherine Bell, female   DOB: December 21, 1950, 66 y.o.   MRN: [id number]  Called by patients nurse   As about to discharge, patients fever is 100.   Discharge cancelled   CBC with diff, CXR, UA, and urine culture ordered.  Also gave verbal order to draw blood cultures if temp at 101   Will Follow  Tamber Burtch A. Kaleen Mask Physician Assistant [facility] [phone]  08/20/2017, 4:36 PM

## 2017-08-20 NOTE — Progress Notes (Signed)
Orthopedic Tech Progress Note Patient Details:  Katherine Bell November 20, 1950 [id number]  Patient ID: Katherine Bell, female   DOB: 01-20-1951, 66 y.o.   MRN: [id number]   Katherine Bell 08/20/2017, 1:07 PM Placed pt's lle on cpm @0 -60 degrees @1310 ; RN notified

## 2017-08-20 NOTE — Progress Notes (Signed)
Spoke with Elnita Maxwell from primary team, aware of above.  See new orders to cancel today's discharge, CXR, CBC, UA/ C&S. If pt temp greater than 101.0 draw bld cx.   AKingRNBSN

## 2017-08-20 NOTE — Progress Notes (Signed)
Subjective: 2 Days Post-Op Procedure(s) (LRB): LEFT TOTAL KNEE ARTHROPLASTY (Left) Patient reports pain as moderate.    Objective: Vital signs in last 24 hours: Temp:  [99 F (37.2 C)-99.7 F (37.6 C)] 99.4 F (37.4 C) (10/17 0624) Pulse Rate:  [99-100] 100 (10/17 0624) Resp:  [16] 16 (10/17 0624) BP: (111-118)/(54-75) 116/54 (10/17 0624) SpO2:  [95 %-97 %] 95 % (10/17 0624)  Intake/Output from previous day: 10/16 0701 - 10/17 0700 In: 920 [P.O.:920] Out: 675 [Urine:675] Intake/Output this shift: No intake/output data recorded.   Recent Labs  08/19/17 0655 08/20/17 0518  HGB 8.8* 9.2*    Recent Labs  08/19/17 0655 08/20/17 0518  WBC 5.6 9.2  RBC 3.73* 3.87  HCT 27.6* 29.0*  PLT 114* 192    Recent Labs  08/19/17 0655 08/20/17 0518  NA 137 137  K 4.7 4.7  CL 104 102  CO2 25 25  BUN 9 12  CREATININE 0.77 0.81  GLUCOSE 235* 235*  CALCIUM 8.2* 8.5*   No results for input(s): LABPT, INR in the last 72 hours.  ABD soft Sensation intact distally Intact pulses distally Compartment soft dressing dry, ted hose in place  Assessment/Plan: 2 Days Post-Op Procedure(s) (LRB): LEFT TOTAL KNEE ARTHROPLASTY (Left) Up with therapy Discharge home with home health  Will add oral antibiotics at discharge due to rising WBC Cautioned patient regarding home BP meds, only administer if BP elevated  Katherine Bell 08/20/2017, 8:44 AM

## 2017-08-20 NOTE — Progress Notes (Signed)
Physical Therapy Treatment Patient Details Name: Katherine Bell MRN: [id number] DOB: 18-Feb-1951 Today's Date: 08/20/2017    History of Present Illness Pt is a 66 y/o female s/p elective L TKA. PMH includes HTN, DM, OSA, and L corneal ulcer.     PT Comments    Pt performed increased activity and reviewed stair training in prep for d/c home.  Will return this afternoon for pm tx before patient is discharged home.  Pt will benefit from skilled rehab via HHPT with support from her spouse.  PTA reviewed supine exercises from HEP and technique for Incentive spirometer.  Pt performed 1x10 reps @ 1250-1545ml quality.     Follow Up Recommendations  DC plan and follow up therapy as arranged by surgeon;Supervision for mobility/OOB     Equipment Recommendations  None recommended by PT    Recommendations for Other Services       Precautions / Restrictions Precautions Precautions: Knee Precaution Booklet Issued: Yes (comment) Precaution Comments: Reviewed supine ther ex with pt.  Restrictions Weight Bearing Restrictions: Yes LLE Weight Bearing: Weight bearing as tolerated    Mobility  Bed Mobility Overal bed mobility: Needs Assistance Bed Mobility: Supine to Sit     Supine to sit: Supervision     General bed mobility comments: Pt using RLE to advance LLE.  Supervision for safety.    Transfers Overall transfer level: Needs assistance Equipment used: Rolling walker (2 wheeled) Transfers: Sit to/from Stand           General transfer comment: Pt required cues for hand placement.  Cues to progress L knee forward due to pain.    Ambulation/Gait Ambulation/Gait assistance: Min guard;Min assist Ambulation Distance (Feet): 200 Feet Assistive device: Rolling walker (2 wheeled) Gait Pattern/deviations: Step-to pattern;Decreased step length - right;Decreased step length - left;Decreased stride length;Antalgic;Step-through pattern Gait velocity: Decreased   General Gait Details: Pt  able to progress to step through pattern with cues for sequencing to improve gait pattern.     Stairs Stairs: Yes   Stair Management: No rails;Forwards;With walker Number of Stairs: 4 General stair comments: Curb training x 4 reps with cues for sequencing and RW placement.  Pt performed multiple reps to practice sequencing for better carryover.    Wheelchair Mobility    Modified Rankin (Stroke Patients Only)       Balance Overall balance assessment: Needs assistance   Sitting balance-Leahy Scale: Good       Standing balance-Leahy Scale: Poor Standing balance comment: Reliant on UE support for balance, Pt with LOB when removing hand from RW to attempt pericare.  Required min assist to correct LOB.                              Cognition Arousal/Alertness: Awake/alert Behavior During Therapy: WFL for tasks assessed/performed Overall Cognitive Status: Within Functional Limits for tasks assessed                                        Exercises Total Joint Exercises Ankle Circles/Pumps: AROM;Both;20 reps;Supine Quad Sets: AROM;Left;10 reps;Supine Towel Squeeze: AROM;Left;10 reps;Supine Short Arc Quad: AROM;Left;10 reps;Supine Heel Slides: AROM;Left;10 reps;Supine Hip ABduction/ADduction: AROM;Left;10 reps;Supine Straight Leg Raises: AROM;Left;10 reps;Supine Goniometric ROM: grossly 5-85 degrees L knee,     General Comments        Pertinent Vitals/Pain Pain Assessment: 0-10 Pain Score: 4  Pain Descriptors / Indicators: Aching;Operative site guarding Pain Intervention(s): Monitored during session;Repositioned;Ice applied    Home Living                      Prior Function            PT Goals (current goals can now be found in the care plan section) Acute Rehab PT Goals Patient Stated Goal: to go home Potential to Achieve Goals: Good Progress towards PT goals: Progressing toward goals    Frequency    7X/week       PT Plan Current plan remains appropriate    Co-evaluation              AM-PAC PT "6 Clicks" Daily Activity  Outcome Measure  Difficulty turning over in bed (including adjusting bedclothes, sheets and blankets)?: A Little Difficulty moving from lying on back to sitting on the side of the bed? : A Little Difficulty sitting down on and standing up from a chair with arms (e.g., wheelchair, bedside commode, etc,.)?: Unable Help needed moving to and from a bed to chair (including a wheelchair)?: A Little Help needed walking in hospital room?: A Little Help needed climbing 3-5 steps with a railing? : A Little 6 Click Score: 16    End of Session Equipment Utilized During Treatment: Gait belt Activity Tolerance: Patient limited by pain Patient left: in chair;with call bell/phone within reach Nurse Communication: Mobility status;Patient requests pain meds PT Visit Diagnosis: Other abnormalities of gait and mobility (R26.89);Pain Pain - Right/Left: Left Pain - part of body: Knee     Time: 0300-9233 PT Time Calculation (min) (ACUTE ONLY): 27 min  Charges:  $Gait Training: 8-22 mins $Therapeutic Exercise: 8-22 mins                    G Codes:       Governor Rooks, PTA pager [phone]    Cristela Blue 08/20/2017, 9:48 AM

## 2017-08-20 NOTE — Care Management Note (Signed)
Case Management Note  Patient Details  Name: Katherine Bell MRN: [id number] Date of Birth: 12/12/1950  Subjective/Objective:  66 yr old female s/p left total knee arthroplasty.                  Action/Plan: Case manager spoke with patient concerning discharge plan and DME. Patient was preoperatively setup with [facility], no changes. She has RW and 3in1, CPM will be delivered to her home. Patient will have support of husband at discharge.     Expected Discharge Date:  08/20/17               Expected Discharge Plan:  [place]  In-House Referral:  NA  Discharge planning Services  CM Consult  Post Acute Care Choice:  Durable Medical Equipment, Home Health Choice offered to:  Patient  DME Arranged:  CP[facility] (has RW and 3in1) DME Agency:  [employer]  HH Arranged:  PT [place]:  [place]  Status of Service:  Completed, signed off  If discussed at [place] of Stay Meetings, dates discussed:    Additional Comments:  Ninfa Meeker, RN 08/20/2017, 9:50 AM

## 2017-08-20 NOTE — Discharge Summary (Signed)
Patient ID: Katherine Bell MRN: [id number] DOB/AGE: [birth year] 66 y.o.  Admit date: 08/18/2017 Discharge date: 08/20/2017  Admission Diagnoses:  Principal Problem:   Primary localized osteoarthrosis of the knee, left Active Problems:   Sleep apnea, obstructive   Osteoarthritis of left knee   Hypothyroidism   Hypertension   Hypercholesteremia   GERD (gastroesophageal reflux disease)   Diabetes mellitus without complication (HCC)   Corneal ulcer, left   Anxiety   Acute blood loss as cause of postoperative anemia   Anemia of chronic disease   Discharge Diagnoses:  Same  Past Medical History:  Diagnosis Date  . Acute blood loss as cause of postoperative anemia 08/19/2017  . Anemia of chronic disease 08/19/2017  . Anxiety   . Corneal ulcer, left   . Diabetes mellitus without complication ([employer])   . GERD (gastroesophageal reflux disease)   . History of kidney stones   . Hypercholesteremia   . Hypertension   . Hypothyroidism   . Osteoarthritis of left knee   . Primary localized osteoarthrosis of the knee, left 08/06/2017  . Sleep apnea, obstructive    used for a year -off for a year    Surgeries: Procedure(s): LEFT TOTAL KNEE ARTHROPLASTY on 08/18/2017   Consultants:   Discharged Condition: Improved  Hospital Course: Katherine Bell is an 66 y.o. female who was admitted 08/18/2017 for operative treatment ofPrimary localized osteoarthrosis of the knee, left. Patient has severe unremitting pain that affects sleep, daily activities, and work/hobbies. After pre-op clearance the patient was taken to the operating room on 08/18/2017 and underwent  Procedure(s): LEFT TOTAL KNEE ARTHROPLASTY.    Patient was given perioperative antibiotics: Anti-infectives    Start     Dose/Rate Route Frequency Ordered Stop   08/18/17 2200  acyclovir (ZOVIRAX) tablet 400 mg     400 mg Oral 2 times daily 08/18/17 1601     08/18/17 1800  ceFAZolin (ANCEF) IVPB 2g/100 mL premix     2 g 200 mL/hr  over 30 Minutes Intravenous Every 6 hours 08/18/17 1201 08/19/17 0107   08/18/17 0845  vancomycin (VANCOCIN) IVPB 1000 mg/200 mL premix     1,000 mg 200 mL/hr over 60 Minutes Intravenous On call to O.R. 08/15/17 1034 08/18/17 0906       Patient was given sequential compression devices, early ambulation, and chemoprophylaxis to prevent DVT.  Patient benefited maximally from hospital stay and there were no complications.    Recent vital signs: Patient Vitals for the past 24 hrs:  BP Temp Temp src Pulse Resp SpO2  08/20/17 0624 (!) 116/54 99.4 F (37.4 C) Oral 100 16 95 %  08/19/17 1940 111/75 99 F (37.2 C) Oral 99 16 97 %  08/19/17 1437 118/64 99.7 F (37.6 C) Oral 99 16 95 %     Recent laboratory studies:  Recent Labs  08/19/17 0655 08/20/17 0518  WBC 5.6 9.2  HGB 8.8* 9.2*  HCT 27.6* 29.0*  PLT 114* 192  NA 137 137  K 4.7 4.7  CL 104 102  CO2 25 25  BUN 9 12  CREATININE 0.77 0.81  GLUCOSE 235* 235*  CALCIUM 8.2* 8.5*     Discharge Medications:   Allergies as of 08/20/2017      Reactions   Penicillins Hives   Has taken keflex without difficulty        Sulfa Antibiotics Hives      Medication List    STOP taking these medications   aspirin 81 MG chewable  tablet Replaced by:  aspirin 325 MG EC tablet   hydrochlorothiazide 25 MG tablet Commonly known as:  HYDRODIURIL     TAKE these medications   acetaminophen 325 MG tablet Commonly known as:  TYLENOL Take 2 tablets (650 mg total) by mouth every 6 (six) hours as needed for mild pain (or Fever >/= 101).   acyclovir 400 MG tablet Commonly known as:  ZOVIRAX Take 400 mg by mouth 2 (two) times daily.   aspirin 325 MG EC tablet 1 tab a day for the next 30 days to prevent blood clots Replaces:  aspirin 81 MG chewable tablet   atorvastatin 40 MG tablet Commonly known as:  LIPITOR Take 20 mg by mouth daily.   CALCIUM 600 PO Take 1,200 mg by mouth daily.   CENTRUM SILVER ULTRA WOMENS PO Take 0.5  tablets by mouth 2 (two) times daily.   docusate sodium 100 MG capsule Commonly known as:  COLACE 1 tab 2 times a day while on narcotics.  STOOL SOFTENER   fexofenadine 180 MG tablet Commonly known as:  ALLEGRA Take 180 mg by mouth daily as needed for allergies.   glipiZIDE 5 MG tablet Commonly known as:  GLUCOTROL Take 5 mg by mouth 2 (two) times daily before a meal.   levothyroxine 100 MCG tablet Commonly known as:  SYNTHROID, LEVOTHROID Take 100 mcg by mouth daily before breakfast.   lisinopril 5 MG tablet Commonly known as:  PRINIVIL,ZESTRIL Take 5 mg by mouth daily.   losartan 25 MG tablet Commonly known as:  COZAAR Take 25 mg by mouth daily.   metFORMIN 1000 MG tablet Commonly known as:  GLUCOPHAGE Take 1,000 mg by mouth 2 (two) times daily with a meal.   oxyCODONE 5 MG immediate release tablet Commonly known as:  Oxy IR/ROXICODONE Take 1-2 tablets (5-10 mg total) by mouth every 3 (three) hours as needed for breakthrough pain.   pantoprazole 40 MG tablet Commonly known as:  PROTONIX Take 40 mg by mouth 2 (two) times daily.   polyethylene glycol packet Commonly known as:  MIRALAX / GLYCOLAX 17grams in 6 oz of water twice a day until bowel movement.  LAXITIVE.  Restart if two days since last bowel movement   sertraline 100 MG tablet Commonly known as:  ZOLOFT Take 100 mg by mouth 2 (two) times daily.   VOLTAREN 1 % Gel Generic drug:  diclofenac sodium Apply 1 application topically 3 (three) times daily.            Discharge Care Instructions        Start     Ordered   08/20/17 0000  Change dressing     08/20/17 0853      Diagnostic Studies: No results found.  Disposition: Final discharge disposition not confirmed  Discharge Instructions    CPM    Complete by:  As directed    Continuous passive motion machine (CPM):      Use the CPM from 0 to 60 for 6-8 hours per day.      You may increase by 5-10 per day.  You may break it up into 2 or 3  sessions per day.      Use CPM for 3-4 weeks or until you are told to stop.   Call MD / Call 911    Complete by:  As directed    If you experience chest pain or shortness of breath, CALL 911 and be transported to the hospital emergency room.  If  you develope a fever above 101 F, pus (white drainage) or increased drainage or redness at the wound, or calf pain, call your surgeon's office.   Change dressing    Complete by:  As directed    Constipation Prevention    Complete by:  As directed    Drink plenty of fluids.  Prune juice and/or coffee may be helpful.  You may use a stool softener, such as Colace (over the counter) 100 mg twice a day.  Use MiraLax (over the counter) for constipation as needed but this may take several days to work.  Mag Citrate --OR-- Milk of Magnesia may also be used but follow directions on the label.   Diet - low sodium heart healthy    Complete by:  As directed    Discharge instructions    Complete by:  As directed    Total Knee Replacement Care After Refer to this sheet in the next few weeks. These discharge instructions provide you with general information on caring for yourself after you leave the hospital. Your caregiver may also give you specific instructions. Your treatment has been planned according to the most current medical practices available, but unavoidable complications sometimes occur. If you have any problems or questions after discharge, please call your caregiver. Regaining a near full range of motion of your knee within the first 3 to 6 weeks after surgery is critical. [place] may resume a normal diet and activities as directed.  Perform exercises as directed.  Place yellow foam block, yellow side up under heel at all times except when in CPM or when walking.  DO NOT modify, tear, cut, or change in any way. You will receive physical therapy daily  Take showers instead of baths until informed otherwise.  Change bandages  (dressings)daily Do not take over-the-counter or prescription medicines for pain, discomfort, or fever. Eat a well-balanced diet.  Avoid lifting or driving until you are instructed otherwise.  Make an appointment to see your caregiver for stitches (suture) or staple removal as directed.  If you have been sent home with a continuous passive motion machine (CPM machine), 0-90 degrees 6 hrs a day   2 hrs a shift SEEK MEDICAL CARE IF: You have swelling of your calf or leg.  You develop shortness of breath or chest pain.  You have redness, swelling, or increasing pain in the wound.  There is pus or any unusual drainage coming from the surgical site.  You notice a bad smell coming from the surgical site or dressing.  The surgical site breaks open after sutures or staples have been removed.  There is persistent bleeding from the suture or staple line.  You are getting worse or are not improving.  You have any other questions or concerns.  SEEK IMMEDIATE MEDICAL CARE IF:  You have a fever.  You develop a rash.  You have difficulty breathing.  You develop any reaction or side effects to medicines given.  Your knee motion is decreasing rather than improving.  MAKE SURE YOU:  Understand these instructions.  Will watch your condition.  Will get help right away if you are not doing well or get worse.   Do not put a pillow under the knee. Place it under the heel.    Complete by:  As directed    Place yellow block under heel at all times except when up walking or in CPM.  You must sleep in it at night   Increase  activity slowly as tolerated    Complete by:  As directed    Patient may shower    Complete by:  As directed    You may shower over the brown dressing.  Once the dressing is removed you may shower without a dressing once there is no drainage.  Do not wash over the wound.  If drainage remains, cover wound with plastic wrap and then shower   TED hose    Complete by:  As directed    Use  stockings (TED hose) for 2 weeks on both leg(s).  You may remove them at night for sleeping.         SignedBenjie Karvonen ROBERTS 08/20/2017, 9:02 AM

## 2017-08-20 NOTE — Progress Notes (Signed)
Occupational Therapy Treatment Patient Details Name: Katherine Bell MRN: [id number] DOB: 06-Aug-1951 Today's Date: 08/20/2017    History of present illness Pt is a 66 y/o female s/p elective L TKA. PMH includes HTN, DM, OSA, and L corneal ulcer.    OT comments  Pt making good progress towards goals, completing functional mobility, functional mobility transfers, and standing ADLs at RW level with overall minguard assist this session. Further education provided on AE and compensatory techniques for completing ADLs with Pt verbalizing and demonstrating understanding. Feel Pt will safely progress home with family assist PRN. Will continue to follow acutely to progress Pt's safety and independence with ADLs and mobility.    Follow Up Recommendations  Home health OT    Equipment Recommendations  None recommended by OT          Precautions / Restrictions Precautions Precautions: Knee Precaution Comments: verbally reviewed with Pt  Restrictions Weight Bearing Restrictions: Yes LLE Weight Bearing: Weight bearing as tolerated       Mobility Bed Mobility               General bed mobility comments: OOB in recliner upon arrival   Transfers Overall transfer level: Needs assistance Equipment used: Rolling walker (2 wheeled) Transfers: Sit to/from Stand Sit to Stand: Min guard         General transfer comment: cues for hand placement; close guard for safety     Balance Overall balance assessment: Needs assistance   Sitting balance-Leahy Scale: Good     Standing balance support: Bilateral upper extremity supported;During functional activity;No upper extremity supported Standing balance-Leahy Scale: Fair Standing balance comment: Pt able to maintain static standing to wash hands at sink                            ADL either performed or assessed with clinical judgement   ADL Overall ADL's : Needs assistance/impaired     Grooming: Wash/dry hands;Min  guard;Standing               Lower Body Dressing: Minimal assistance;Sit to/from stand;With adaptive equipment Lower Body Dressing Details (indicate cue type and reason): educated Pt on use of reacher to complete LB dressing with Pt return demonstrating with good understanding  Toilet Transfer: Min guard;Ambulation;BSC;RW Toilet Transfer Details (indicate cue type and reason): BSC over toilet  Toileting- Clothing Manipulation and Hygiene: Min guard;Sit to/from stand   Tub/ Shower Transfer: Walk-in shower;Min guard;Ambulation;3 in 1;Rolling walker;Cueing for sequencing Tub/Shower Transfer Details (indicate cue type and reason): Pt demonstrates good carryover of education on sequence/technique; handout provided on sequence for transfer  Functional mobility during ADLs: Min guard;Rolling walker General ADL Comments: further education provided on AE and compensatory techniques for completing ADLs and functional mobility transfers                        Cognition Arousal/Alertness: Awake/alert Behavior During Therapy: WFL for tasks assessed/performed Overall Cognitive Status: Within Functional Limits for tasks assessed                                                            Pertinent Vitals/ Pain       Pain Assessment: Faces Faces Pain Scale: Hurts little more Pain  Location: L knee Pain Descriptors / Indicators: Aching;Operative site guarding Pain Intervention(s): Monitored during session;Repositioned;Ice applied                                                          Frequency  Min 2X/week        Progress Toward Goals  OT Goals(current goals can now be found in the care plan section)  Progress towards OT goals: Progressing toward goals  Acute Rehab OT Goals Patient Stated Goal: to go home OT Goal Formulation: With patient Time For Goal Achievement: 09/02/17 Potential to Achieve Goals: Good  Plan Discharge  plan remains appropriate                     AM-PAC PT "6 Clicks" Daily Activity     Outcome Measure   Help from another person eating meals?: None Help from another person taking care of personal grooming?: A Little Help from another person toileting, which includes using toliet, bedpan, or urinal?: A Little Help from another person bathing (including washing, rinsing, drying)?: A Little Help from another person to put on and taking off regular upper body clothing?: A Little Help from another person to put on and taking off regular lower body clothing?: A Little 6 Click Score: 19    End of Session Equipment Utilized During Treatment: Gait belt;Rolling walker  OT Visit Diagnosis: Unsteadiness on feet (R26.81);Other abnormalities of gait and mobility (R26.89)   Activity Tolerance Patient tolerated treatment well   Patient Left in chair;with call bell/phone within reach   Nurse Communication Mobility status        Time: 1005-1040 OT Time Calculation (min): 35 min  Charges: OT General Charges $OT Visit: 1 Visit OT Treatments $Self Care/Home Management : 23-37 mins  Lou Cal, OT Pager [phone] 08/20/2017    Katherine Bell 08/20/2017, 2:12 PM

## 2017-08-20 NOTE — Progress Notes (Signed)
Physical Therapy Treatment Patient Details Name: Katherine Bell MRN: [id number] DOB: [birth year] Today's Date: 08/20/2017    History of Present Illness Pt is a 66 y/o female s/p elective L TKA. PMH includes HTN, DM, OSA, and L corneal ulcer.     PT Comments    Pt performed gait training, reviewed exercises and reviewed entry into car ( via runner board ), Pt awaiting d/c home.  Plan remains appropriate for home.     Follow Up Recommendations  DC plan and follow up therapy as arranged by surgeon;Supervision for mobility/OOB     Equipment Recommendations  None recommended by PT    Recommendations for Other Services       Precautions / Restrictions Precautions Precautions: Knee Precaution Comments: verbally reviewed with Pt  Restrictions Weight Bearing Restrictions: Yes LLE Weight Bearing: Weight bearing as tolerated    Mobility  Bed Mobility Overal bed mobility: Needs Assistance Bed Mobility: Supine to Sit     Supine to sit: Supervision Sit to supine: Supervision   General bed mobility comments: Pt able to self assist LLE into and Out of the bed.  Pt used hooking method.    Transfers Overall transfer level: Needs assistance Equipment used: Rolling walker (2 wheeled) Transfers: Sit to/from Stand Sit to Stand: Min guard         General transfer comment: cues for hand placement; close guard for safety   Ambulation/Gait Ambulation/Gait assistance: Min guard;Min assist Ambulation Distance (Feet): 250 Feet Assistive device: Rolling walker (2 wheeled) Gait Pattern/deviations: Step-to pattern;Decreased step length - right;Decreased step length - left;Decreased stride length;Antalgic;Step-through pattern Gait velocity: Decreased Gait velocity interpretation: Below normal speed for age/gender General Gait Details: Pt able to progress to step through pattern with cues for sequencing to improve gait pattern.  Reports increased pain but tolerated tx well.      Stairs Stairs: Yes   Stair Management: No rails;Forwards;With walker;Backwards Number of Stairs: 1 General stair comments: Simulated step backward to runner board to enter her car.  Pt required cues for sequencing and hand placement.    Wheelchair Mobility    Modified Rankin (Stroke Patients Only)       Balance Overall balance assessment: Needs assistance   Sitting balance-Leahy Scale: Good     Standing balance support: Bilateral upper extremity supported;During functional activity;No upper extremity supported Standing balance-Leahy Scale: Fair Standing balance comment: Pt able to maintain static standing to wash hands at sink                             Cognition Arousal/Alertness: Awake/alert Behavior During Therapy: WFL for tasks assessed/performed Overall Cognitive Status: Within Functional Limits for tasks assessed                                        Exercises Total Joint Exercises Ankle Circles/Pumps: AROM;Both;20 reps;Supine Quad Sets: AROM;Left;10 reps;Supine Towel Squeeze: AROM;Left;10 reps;Supine Heel Slides: AROM;Left;10 reps;Supine Hip ABduction/ADduction: AROM;Left;10 reps;Supine Straight Leg Raises: AROM;Left;10 reps;Supine Long Arc Quad: AROM;Left;10 reps;Supine    General Comments        Pertinent Vitals/Pain Pain Assessment: 0-10 Pain Score: 7  Faces Pain Scale: Hurts little more Pain Location: L knee Pain Descriptors / Indicators: Aching;Operative site guarding Pain Intervention(s): Monitored during session;Repositioned;Ice applied    Home Living  Prior Function            PT Goals (current goals can now be found in the care plan section) Acute Rehab PT Goals Patient Stated Goal: to go home Potential to Achieve Goals: Good Progress towards PT goals: Progressing toward goals    Frequency    7X/week      PT Plan Current plan remains appropriate     Co-evaluation              AM-PAC PT "6 Clicks" Daily Activity  Outcome Measure  Difficulty turning over in bed (including adjusting bedclothes, sheets and blankets)?: A Little Difficulty moving from lying on back to sitting on the side of the bed? : A Little Difficulty sitting down on and standing up from a chair with arms (e.g., wheelchair, bedside commode, etc,.)?: A Little Help needed moving to and from a bed to chair (including a wheelchair)?: A Little Help needed walking in hospital room?: A Little Help needed climbing 3-5 steps with a railing? : A Little 6 Click Score: 18    End of Session Equipment Utilized During Treatment: Gait belt Activity Tolerance: Patient limited by pain Patient left: in chair;with call bell/phone within reach Nurse Communication: Mobility status;Patient requests pain meds PT Visit Diagnosis: Other abnormalities of gait and mobility (R26.89);Pain Pain - Right/Left: Left Pain - part of body: Knee     Time: 5784-6962 PT Time Calculation (min) (ACUTE ONLY): 35 min  Charges:  $Gait Training: 8-22 mins $Therapeutic Exercise: 8-22 mins                    G Codes:       Governor Rooks, PTA pager [phone]    Cristela Blue 08/20/2017, 2:44 PM

## 2017-08-20 NOTE — Progress Notes (Signed)
Pt discharged to home in stable condtion, all discharge instructions given to pt, along with Rx's x6.  Pt belongings gathered for transport.  AKingRNBSN.

## 2017-08-21 ENCOUNTER — Encounter (HOSPITAL_COMMUNITY): Payer: Self-pay | Admitting: *Deleted

## 2017-08-21 LAB — CBC
HEMATOCRIT: 22.5 % — AB (ref 36.0–46.0)
HEMATOCRIT: 29.4 % — AB (ref 36.0–46.0)
HEMOGLOBIN: 7.2 g/dL — AB (ref 12.0–15.0)
HEMOGLOBIN: 9.2 g/dL — AB (ref 12.0–15.0)
MCH: 24.1 pg — ABNORMAL LOW (ref 26.0–34.0)
MCH: 24 pg — AB (ref 26.0–34.0)
MCHC: 32 g/dL (ref 30.0–36.0)
MCHC: 31.3 g/dL (ref 30.0–36.0)
MCV: 75.3 fL — AB (ref 78.0–100.0)
MCV: 76.8 fL — AB (ref 78.0–100.0)
Platelets: 82 10*3/uL — ABNORMAL LOW (ref 150–400)
Platelets: 97 10*3/uL — ABNORMAL LOW (ref 150–400)
RBC: 2.99 MIL/uL — ABNORMAL LOW (ref 3.87–5.11)
RBC: 3.83 MIL/uL — AB (ref 3.87–5.11)
RDW: 15.3 % (ref 11.5–15.5)
RDW: 15.6 % — ABNORMAL HIGH (ref 11.5–15.5)
WBC: 2.9 10*3/uL — ABNORMAL LOW (ref 4.0–10.5)
WBC: 3 10*3/uL — ABNORMAL LOW (ref 4.0–10.5)

## 2017-08-21 LAB — BASIC METABOLIC PANEL
Anion gap: 9 (ref 5–15)
BUN: 7 mg/dL (ref 6–20)
CALCIUM: 8.2 mg/dL — AB (ref 8.9–10.3)
CHLORIDE: 103 mmol/L (ref 101–111)
CO2: 28 mmol/L (ref 22–32)
CREATININE: 0.72 mg/dL (ref 0.44–1.00)
GFR calc non Af Amer: >60 mL/min (ref >60)
GLUCOSE: 133 mg/dL — AB (ref 65–99)
Potassium: 3.9 mmol/L (ref 3.5–5.1)
Sodium: 140 mmol/L (ref 135–145)

## 2017-08-21 LAB — PREPARE RBC (CROSSMATCH)

## 2017-08-21 LAB — GLUCOSE, CAPILLARY
GLUCOSE-CAPILLARY: 125 mg/dL — AB (ref 65–99)
Glucose-Capillary: 114 mg/dL — ABNORMAL HIGH (ref 65–99)
Glucose-Capillary: 177 mg/dL — ABNORMAL HIGH (ref 65–99)
Glucose-Capillary: 169 mg/dL — ABNORMAL HIGH (ref 65–99)

## 2017-08-21 MED ORDER — SODIUM CHLORIDE 0.9 % IV SOLN
Freq: Once | INTRAVENOUS | Status: AC
  Administered 2017-08-21: 12:00:00 via INTRAVENOUS

## 2017-08-21 MED ORDER — FUROSEMIDE 10 MG/ML IJ SOLN
20.0000 mg | Freq: Once | INTRAMUSCULAR | Status: AC
  Administered 2017-08-21: 20 mg via INTRAVENOUS
  Filled 2017-08-21: qty 2

## 2017-08-21 NOTE — Progress Notes (Signed)
Orthopedic Tech Progress Note Patient Details:  Katherine Bell Jun 25, 1951 [id number]  Patient ID: [name], female   DOB: Jul 23, 1951, 66 y.o.   MRN: [id number]   Hildred Priest 08/21/2017, 1:04 PM Placed pt's lle on cpm @0 -60 degrees @1300 ; will increase as pt tolerates; RN notified

## 2017-08-21 NOTE — Progress Notes (Signed)
PT Cancellation Note  Patient Details Name: Katherine Bell MRN: [id number] DOB: 01/16/1951   Cancelled Treatment:    Reason Eval/Treat Not Completed: (P)  (Pt just recieving 1st unit of blood will return after blood has been administered to ensure patient is not presenting with any adverse reactions.  )   [name] [name] 08/21/2017, 11:49 AM  Governor Rooks, PTA pager [phone]

## 2017-08-21 NOTE — Progress Notes (Signed)
Occupational Therapy Treatment Patient Details Name: Katherine Bell MRN: [id number] DOB: 1951-07-15 Today's Date: 08/21/2017    History of present illness Pt is a 66 y/o female s/p elective L TKA. PMH includes HTN, DM, OSA, and L corneal ulcer.    OT comments  Pt with limited progression towards goals this session due to increased fatigue and pain in LLE. Pt completed stand pivot transfer to [facility] x2 times during session with MinA at RW level, required MaxA for peri care after toileting. Feel recommendation for HHOT remains appropriate at this time. Will continue to follow acutely to progress Pt's safety and independence with ADLs and mobility prior to return home.    Follow Up Recommendations  Home health OT    Equipment Recommendations  None recommended by OT          Precautions / Restrictions Precautions Precautions: Knee Restrictions Weight Bearing Restrictions: Yes LLE Weight Bearing: Weight bearing as tolerated       Mobility Bed Mobility Overal bed mobility: Needs Assistance Bed Mobility: Sit to Supine;Supine to Sit     Supine to sit: Supervision Sit to supine: Supervision   General bed mobility comments: close supervision for safety; Pt able to self assist LLE into/out of bed using hook method   Transfers Overall transfer level: Needs assistance Equipment used: Rolling walker (2 wheeled) Transfers: Sit to/from [facility] Sit to Stand: Min assist Stand pivot transfers: Min assist       General transfer comment: assist to rise and steady; verbal cues for hand placement and safe RW use during transfer     Balance Overall balance assessment: Needs assistance Sitting-balance support: No upper extremity supported;Feet supported Sitting balance-Leahy Scale: Fair     Standing balance support: Bilateral upper extremity supported;During functional activity Standing balance-Leahy Scale: Poor Standing balance comment: reliant on UE support on RW  during mobility                            ADL either performed or assessed with clinical judgement   ADL Overall ADL's : Needs assistance/impaired                         Toilet Transfer: Minimal assistance;Stand-pivot;BSC;RW Toilet Transfer Details (indicate cue type and reason): verbal cues for safe transfer technique  Toileting- Clothing Manipulation and Hygiene: Sit to/from stand;Maximal assistance Toileting - Clothing Manipulation Details (indicate cue type and reason): assist for peri care after BM     Functional mobility during ADLs: Minimal assistance;Rolling walker General ADL Comments: Pt with decreased mobility and ADL status this session, completing stand pivot transfer EOB to/from [facility] x2 and using bed pan x1 during session due to need to urinate and need to have BM; will continue to follow for progress of ADL/mobility status                        Cognition Arousal/Alertness: Awake/alert Behavior During Therapy: WFL for tasks assessed/performed Overall Cognitive Status: Within Functional Limits for tasks assessed                                                            Pertinent Vitals/ Pain  Pain Assessment: Faces Faces Pain Scale: Hurts whole lot Pain Location: L knee Pain Descriptors / Indicators: Aching;Operative site guarding;Grimacing Pain Intervention(s): Limited activity within patient's tolerance;Monitored during session;RN gave pain meds during session;Ice applied                                                          Frequency  Min 2X/week        Progress Toward Goals  OT Goals(current goals can now be found in the care plan section)  Progress towards OT goals: OT to reassess next treatment  Acute Rehab OT Goals Patient Stated Goal: to go home OT Goal Formulation: With patient Time For Goal Achievement: 09/02/17 Potential to Achieve Goals: Good  Plan  Discharge plan remains appropriate                     AM-PAC PT "6 Clicks" Daily Activity     Outcome Measure   Help from another person eating meals?: None Help from another person taking care of personal grooming?: A Little Help from another person toileting, which includes using toliet, bedpan, or urinal?: A Little Help from another person bathing (including washing, rinsing, drying)?: A Little Help from another person to put on and taking off regular upper body clothing?: A Little Help from another person to put on and taking off regular lower body clothing?: A Little 6 Click Score: 19    End of Session Equipment Utilized During Treatment: Rolling walker  OT Visit Diagnosis: Unsteadiness on feet (R26.81);Other abnormalities of gait and mobility (R26.89)   Activity Tolerance Patient limited by fatigue;Patient limited by pain   Patient Left in bed;with call bell/phone within reach   Nurse Communication Mobility status        Time: 3244-0102 OT Time Calculation (min): 31 min  Charges: OT General Charges $OT Visit: 1 Visit OT Treatments $Self Care/Home Management : 23-37 mins  Lou Cal, OT Pager [phone] 08/21/2017    Raymondo Band 08/21/2017, 3:53 PM

## 2017-08-21 NOTE — Progress Notes (Signed)
Subjective: 3 Days Post-Op Procedure(s) (LRB): LEFT TOTAL KNEE ARTHROPLASTY (Left) Patient reports pain as moderate.   No shortness of breath voiding without difficulty Appetite good  Objective: Vital signs in last 24 hours: Temp:  [97.9 F (36.6 C)-100 F (37.8 C)] 97.9 F (36.6 C) (10/18 0412) Pulse Rate:  [98-110] 109 (10/18 0412) Resp:  [16-17] 17 (10/18 0412) BP: (126-128)/(55-65) 128/65 (10/18 0412) SpO2:  [93 %-95 %] 93 % (10/18 0412)  Intake/Output from previous day: 10/17 0701 - 10/18 0700 In: 240 [P.O.:240] Out: 690 [Urine:690] Intake/Output this shift: No intake/output data recorded.   Recent Labs  08/19/17 0655 08/20/17 0518 08/20/17 1910 08/21/17 0501  HGB 8.8* 9.2* 7.4* 7.2*    Recent Labs  08/20/17 1910 08/21/17 0501  WBC 3.7* 2.9*  RBC 3.11* 2.99*  HCT 23.3* 22.5*  PLT 87* 82*    Recent Labs  08/20/17 0518 08/21/17 0501  NA 137 140  K 4.7 3.9  CL 102 103  CO2 25 28  BUN 12 7  CREATININE 0.81 0.72  GLUCOSE 235* 133*  CALCIUM 8.5* 8.2*   No results for input(s): LABPT, INR in the last 72 hours.  ABD soft Neurovascular intact Dorsiflexion/Plantar flexion intact No cellulitis present Compartment soft dressing dry  Assessment/Plan:  3 Days Post-Op Procedure(s) (LRB): LEFT TOTAL KNEE ARTHROPLASTY (Left)  Post blood loss anemia Elevated temperature resolved   Transfuse 2 units  Hold aspirin today recheck labs Maintain SCDs, encourage foot pumps and incentive spirometer Up with therapy  Hopeful discharge to home tomorrow   BLAIR ROBERTS 08/21/2017, 8:53 AM

## 2017-08-21 NOTE — Progress Notes (Signed)
Physical Therapy Treatment Patient Details Name: Katherine Bell MRN: [id number] DOB: [birth year] Today's Date: 08/21/2017    History of Present Illness Pt is a 66 y/o female s/p elective L TKA. PMH includes HTN, DM, OSA, and L corneal ulcer.     PT Comments    Pt performed gait and therapeutic exercises this afternoon post blood transfusion.  Pt required encouragement to participate in treatment this afternoon.  Plan for repeat of stair training in am.   Follow Up Recommendations  DC plan and follow up therapy as arranged by surgeon;Supervision for mobility/OOB     Equipment Recommendations  None recommended by PT    Recommendations for Other Services       Precautions / Restrictions Precautions Precautions: Knee Precaution Booklet Issued: Yes (comment) Precaution Comments: verbally reviewed with Pt  Required Braces or Orthoses: Knee Immobilizer - Left Knee Immobilizer - Left:  (until discontinued.  ) Restrictions Weight Bearing Restrictions: Yes LLE Weight Bearing: Weight bearing as tolerated    Mobility  Bed Mobility Overal bed mobility: Needs Assistance Bed Mobility: Sit to Supine;Supine to Sit     Supine to sit: Supervision Sit to supine: Supervision   General bed mobility comments: close supervision for safety; Pt able to self assist LLE out of bed using hook method   Transfers Overall transfer level: Needs assistance Equipment used: Rolling walker (2 wheeled) Transfers: Sit to/from [facility] Sit to Stand: Min guard Stand pivot transfers: Min guard       General transfer comment: Cues for hand placement to and from seated surface.    Ambulation/Gait Ambulation/Gait assistance: Min assist;Min guard Ambulation Distance (Feet): 250 Feet Assistive device: Rolling walker (2 wheeled) Gait Pattern/deviations: Decreased step length - right;Decreased step length - left;Decreased stride length;Antalgic;Step-through pattern Gait velocity:  Decreased   General Gait Details: Pt remains able to maintain step through pattern.  Encouragement to advance mobility this afternoon post blood transfusion.  Pt required cues for upper trunk control.     Stairs            Wheelchair Mobility    Modified Rankin (Stroke Patients Only)       Balance Overall balance assessment: Needs assistance Sitting-balance support: No upper extremity supported;Feet supported Sitting balance-Leahy Scale: Fair     Standing balance support: Bilateral upper extremity supported;During functional activity Standing balance-Leahy Scale: Poor Standing balance comment: reliant on UE support on RW during mobility                             Cognition Arousal/Alertness: Awake/alert Behavior During Therapy: WFL for tasks assessed/performed Overall Cognitive Status: Within Functional Limits for tasks assessed                                        Exercises Total Joint Exercises Ankle Circles/Pumps: AROM;Both;20 reps;Supine Quad Sets: AROM;Left;10 reps;Supine Towel Squeeze: AROM;Left;10 reps;Supine Short Arc Quad: AROM;Left;10 reps;Supine Heel Slides: AROM;Left;10 reps;Supine Hip ABduction/ADduction: AROM;Left;10 reps;Supine Straight Leg Raises: AROM;Left;10 reps;Supine Long Arc Quad: AROM;Left;10 reps;Supine Goniometric ROM: 68 degrees flexion in L knee.      General Comments        Pertinent Vitals/Pain Pain Assessment: 0-10 Pain Score: 7  Faces Pain Scale: Hurts whole lot Pain Location: L knee Pain Descriptors / Indicators: Aching;Operative site guarding;Grimacing Pain Intervention(s): Monitored during session;Repositioned    Home Living  Prior Function            PT Goals (current goals can now be found in the care plan section) Acute Rehab PT Goals Patient Stated Goal: to go home Potential to Achieve Goals: Good Progress towards PT goals: Progressing toward  goals    Frequency    7X/week      PT Plan Current plan remains appropriate    Co-evaluation              AM-PAC PT "6 Clicks" Daily Activity  Outcome Measure  Difficulty turning over in bed (including adjusting bedclothes, sheets and blankets)?: A Little Difficulty moving from lying on back to sitting on the side of the bed? : A Little Difficulty sitting down on and standing up from a chair with arms (e.g., wheelchair, bedside commode, etc,.)?: A Little Help needed moving to and from a bed to chair (including a wheelchair)?: A Little Help needed walking in hospital room?: A Little Help needed climbing 3-5 steps with a railing? : A Little 6 Click Score: 18    End of Session Equipment Utilized During Treatment: Gait belt Activity Tolerance: Patient limited by pain Patient left: in chair;with call bell/phone within reach Nurse Communication: Mobility status;Patient requests pain meds PT Visit Diagnosis: Other abnormalities of gait and mobility (R26.89);Pain Pain - Right/Left: Left Pain - part of body: Knee     Time: 9030-0923 PT Time Calculation (min) (ACUTE ONLY): 34 min  Charges:  $Gait Training: 8-22 mins $Therapeutic Exercise: 8-22 mins                    G Codes:       Governor Rooks, PTA pager [phone]    Cristela Blue 08/21/2017, 6:30 PM

## 2017-08-22 ENCOUNTER — Inpatient Hospital Stay (HOSPITAL_COMMUNITY): Payer: Medicare Other

## 2017-08-22 ENCOUNTER — Encounter (HOSPITAL_COMMUNITY): Payer: Self-pay | Admitting: Radiology

## 2017-08-22 DIAGNOSIS — D473 Essential (hemorrhagic) thrombocythemia: Secondary | ICD-10-CM

## 2017-08-22 DIAGNOSIS — D638 Anemia in other chronic diseases classified elsewhere: Secondary | ICD-10-CM

## 2017-08-22 DIAGNOSIS — D62 Acute posthemorrhagic anemia: Secondary | ICD-10-CM

## 2017-08-22 DIAGNOSIS — R161 Splenomegaly, not elsewhere classified: Secondary | ICD-10-CM

## 2017-08-22 DIAGNOSIS — D75839 Thrombocytosis, unspecified: Secondary | ICD-10-CM

## 2017-08-22 LAB — GLUCOSE, CAPILLARY
GLUCOSE-CAPILLARY: 122 mg/dL — AB (ref 65–99)
GLUCOSE-CAPILLARY: 196 mg/dL — AB (ref 65–99)
Glucose-Capillary: 159 mg/dL — ABNORMAL HIGH (ref 65–99)
Glucose-Capillary: 211 mg/dL — ABNORMAL HIGH (ref 65–99)
Glucose-Capillary: 159 mg/dL — ABNORMAL HIGH (ref 65–99)

## 2017-08-22 LAB — CBC WITH DIFFERENTIAL/PLATELET
BAND NEUTROPHILS: 9 %
BASOS ABS: 0 10*3/uL (ref 0.0–0.1)
BASOS ABS: 0 10*3/uL (ref 0.0–0.1)
BASOS PCT: 0 %
BLASTS: 0 %
Basophils Relative: 0 %
EOS ABS: 0 10*3/uL (ref 0.0–0.7)
Eosinophils Absolute: 0 10*3/uL (ref 0.0–0.7)
Eosinophils Relative: 0 %
Eosinophils Relative: 0 %
HEMATOCRIT: 27.9 % — AB (ref 36.0–46.0)
HEMATOCRIT: 30.6 % — AB (ref 36.0–46.0)
HEMOGLOBIN: 8.8 g/dL — AB (ref 12.0–15.0)
HEMOGLOBIN: 10 g/dL — AB (ref 12.0–15.0)
Lymphocytes Relative: 20 %
Lymphocytes Relative: 20 %
Lymphs Abs: 0.5 10*3/uL — ABNORMAL LOW (ref 0.7–4.0)
Lymphs Abs: 0.5 10*3/uL — ABNORMAL LOW (ref 0.7–4.0)
MCH: 24.2 pg — ABNORMAL LOW (ref 26.0–34.0)
MCH: 25.2 pg — ABNORMAL LOW (ref 26.0–34.0)
MCHC: 31.5 g/dL (ref 30.0–36.0)
MCHC: 32.7 g/dL (ref 30.0–36.0)
MCV: 76.9 fL — ABNORMAL LOW (ref 78.0–100.0)
MCV: 77.1 fL — ABNORMAL LOW (ref 78.0–100.0)
METAMYELOCYTES PCT: 0 %
MYELOCYTES: 0 %
Monocytes Absolute: 0.2 10*3/uL (ref 0.1–1.0)
Monocytes Absolute: 0.2 10*3/uL (ref 0.1–1.0)
Monocytes Relative: 7 %
Monocytes Relative: 7 %
NEUTROS ABS: 1.9 10*3/uL (ref 1.7–7.7)
NEUTROS PCT: 73 %
Neutro Abs: 1.8 10*3/uL (ref 1.7–7.7)
Neutrophils Relative %: 64 %
Other: 0 %
PROMYELOCYTES ABS: 0 %
Platelets: 94 10*3/uL — ABNORMAL LOW (ref 150–400)
Platelets: 99 10*3/uL — ABNORMAL LOW (ref 150–400)
RBC: 3.63 MIL/uL — AB (ref 3.87–5.11)
RBC: 3.97 MIL/uL (ref 3.87–5.11)
RDW: 15.3 % (ref 11.5–15.5)
RDW: 15.9 % — AB (ref 11.5–15.5)
WBC: 2.5 10*3/uL — AB (ref 4.0–10.5)
WBC: 2.6 10*3/uL — ABNORMAL LOW (ref 4.0–10.5)
nRBC: 0 /100 WBC

## 2017-08-22 LAB — PREPARE RBC (CROSSMATCH)

## 2017-08-22 LAB — COMPREHENSIVE METABOLIC PANEL
ALBUMIN: 3.1 g/dL — AB (ref 3.5–5.0)
ALK PHOS: 84 U/L (ref 38–126)
ALT: 27 U/L (ref 14–54)
AST: 29 U/L (ref 15–41)
Anion gap: 8 (ref 5–15)
BILIRUBIN TOTAL: 1.2 mg/dL (ref 0.3–1.2)
BUN: 7 mg/dL (ref 6–20)
CALCIUM: 8.6 mg/dL — AB (ref 8.9–10.3)
CO2: 32 mmol/L (ref 22–32)
Chloride: 98 mmol/L — ABNORMAL LOW (ref 101–111)
Creatinine, Ser: 0.73 mg/dL (ref 0.44–1.00)
GFR calc Af Amer: >60 mL/min (ref >60)
GFR calc non Af Amer: >60 mL/min (ref >60)
GLUCOSE: 174 mg/dL — AB (ref 65–99)
POTASSIUM: 4 mmol/L (ref 3.5–5.1)
Sodium: 138 mmol/L (ref 135–145)
TOTAL PROTEIN: 5.9 g/dL — AB (ref 6.5–8.1)

## 2017-08-22 LAB — URINE CULTURE: Culture: <10000 — AB

## 2017-08-22 MED ORDER — SODIUM CHLORIDE 0.9 % IV SOLN
Freq: Once | INTRAVENOUS | Status: AC
  Administered 2017-08-22: 15:00:00 via INTRAVENOUS

## 2017-08-22 MED ORDER — IOPAMIDOL (ISOVUE-370) INJECTION 76%
100.0000 mL | Freq: Once | INTRAVENOUS | Status: AC | PRN
  Administered 2017-08-22: 100 mL via INTRAVENOUS

## 2017-08-22 MED ORDER — FUROSEMIDE 10 MG/ML IJ SOLN
20.0000 mg | Freq: Once | INTRAMUSCULAR | Status: AC
  Administered 2017-08-22: 20 mg via INTRAVENOUS
  Filled 2017-08-22: qty 2

## 2017-08-22 NOTE — Progress Notes (Signed)
Physical Therapy Treatment Patient Details Name: Katherine Bell MRN: [id number] DOB: September 28, 1951 Today's Date: 08/22/2017    History of Present Illness Pt is a 66 y/o female s/p elective L TKA. PMH includes HTN, DM, OSA, and L corneal ulcer.     PT Comments    Pt with plans for blood transfusion this pm.  Pt tolerated tx well.  Plan this pm for gait and strengthening exercises.     Follow Up Recommendations  DC plan and follow up therapy as arranged by surgeon;Supervision for mobility/OOB     Equipment Recommendations  None recommended by PT    Recommendations for Other Services       Precautions / Restrictions Precautions Precautions: Knee Precaution Booklet Issued: Yes (comment) Precaution Comments: verbally reviewed with Pt  Required Braces or Orthoses: Knee Immobilizer - Left Knee Immobilizer - Left:  (until discontinued) Restrictions Weight Bearing Restrictions: Yes LLE Weight Bearing: Weight bearing as tolerated    Mobility  Bed Mobility Overal bed mobility: Needs Assistance Bed Mobility: Supine to Sit     Supine to sit: Supervision     General bed mobility comments: close supervision for safety; Pt able to self assist LLE out of bed using hook method   Transfers Overall transfer level: Needs assistance Equipment used: Rolling walker (2 wheeled) Transfers: Sit to/from [facility] Sit to Stand: Min guard Stand pivot transfers: Min guard       General transfer comment: Cues for hand placement to and from seated surface.    Ambulation/Gait Ambulation/Gait assistance: Min guard Ambulation Distance (Feet): 250 Feet Assistive device: Rolling walker (2 wheeled) Gait Pattern/deviations: Decreased step length - right;Decreased step length - left;Decreased stride length;Antalgic;Step-through pattern Gait velocity: Decreased Gait velocity interpretation: Below normal speed for age/gender General Gait Details: Pt remains able to maintain step  through pattern.  Encouragement to advance mobility this afternoon post blood transfusion.  Pt required cues for upper trunk control.     Stairs            Wheelchair Mobility    Modified Rankin (Stroke Patients Only)       Balance Overall balance assessment: Needs assistance   Sitting balance-Leahy Scale: Fair       Standing balance-Leahy Scale: Poor Standing balance comment: reliant on UE support on RW during mobility                             Cognition Arousal/Alertness: Awake/alert Behavior During Therapy: WFL for tasks assessed/performed Overall Cognitive Status: Within Functional Limits for tasks assessed                                        Exercises Total Joint Exercises Ankle Circles/Pumps: AROM;Both;20 reps;Supine Quad Sets: AROM;Left;10 reps;Supine Towel Squeeze: AROM;Left;10 reps;Supine Short Arc Quad: AROM;Left;10 reps;Supine Heel Slides: AROM;Left;10 reps;Supine Hip ABduction/ADduction: AROM;Left;10 reps;Supine Straight Leg Raises: AROM;Left;10 reps;Supine Goniometric ROM: 75 degrees flexion.      General Comments        Pertinent Vitals/Pain Pain Assessment: 0-10 Pain Score: 6  Pain Location: L knee Pain Descriptors / Indicators: Aching;Operative site guarding;Grimacing Pain Intervention(s): Monitored during session;Repositioned;Ice applied    Home Living                      Prior Function  PT Goals (current goals can now be found in the care plan section) Acute Rehab PT Goals Patient Stated Goal: to go home Potential to Achieve Goals: Good Progress towards PT goals: Progressing toward goals    Frequency    7X/week      PT Plan Current plan remains appropriate    Co-evaluation              AM-PAC PT "6 Clicks" Daily Activity  Outcome Measure  Difficulty turning over in bed (including adjusting bedclothes, sheets and blankets)?: A Little Difficulty moving from  lying on back to sitting on the side of the bed? : A Little Difficulty sitting down on and standing up from a chair with arms (e.g., wheelchair, bedside commode, etc,.)?: A Little Help needed moving to and from a bed to chair (including a wheelchair)?: A Little Help needed walking in hospital room?: A Little Help needed climbing 3-5 steps with a railing? : A Little 6 Click Score: 18    End of Session Equipment Utilized During Treatment: Gait belt Activity Tolerance: Patient limited by pain Patient left: in chair;with call bell/phone within reach Nurse Communication: Mobility status;Patient requests pain meds PT Visit Diagnosis: Other abnormalities of gait and mobility (R26.89);Pain Pain - Right/Left: Left Pain - part of body: Knee     Time: 6834-1962 PT Time Calculation (min) (ACUTE ONLY): 31 min  Charges:  $Gait Training: 8-22 mins $Therapeutic Exercise: 8-22 mins                    G Codes:       Governor Rooks, PTA pager [phone]    Cristela Blue 08/22/2017, 5:45 PM

## 2017-08-22 NOTE — Progress Notes (Signed)
Subjective: 4 Days Post-Op Procedure(s) (LRB): LEFT TOTAL KNEE ARTHROPLASTY (Left) Patient reports pain as moderate.   Still feeling weak when active Appetite good No difficulty voiding  Objective: Vital signs in last 24 hours: Temp:  [98.9 F (37.2 C)-100.7 F (38.2 C)] 99.1 F (37.3 C) (10/19 0721) Pulse Rate:  [103-114] 106 (10/19 0518) Resp:  [18-20] 18 (10/19 0518) BP: (127-153)/(54-75) 127/72 (10/19 1051) SpO2:  [95 %-98 %] 98 % (10/19 0518)  Intake/Output from previous day: 10/18 0701 - 10/19 0700 In: 1360 [P.O.:960; I.V.:85; Blood:315] Out: 400 [Urine:400] Intake/Output this shift: Total I/O In: 240 [P.O.:240] Out: 1000 [Urine:1000]   Recent Labs  08/20/17 0518 08/20/17 1910 08/21/17 0501 08/21/17 2302 08/22/17 0628  HGB 9.2* 7.4* 7.2* 9.2* 8.8*    Recent Labs  08/21/17 2302 08/22/17 0628  WBC 3.0* 2.5*  RBC 3.83* 3.63*  HCT 29.4* 27.9*  PLT 97* 94*    Recent Labs  08/21/17 0501 08/22/17 0628  NA 140 138  K 3.9 4.0  CL 103 98*  CO2 28 32  BUN 7 7  CREATININE 0.72 0.73  GLUCOSE 133* 174*  CALCIUM 8.2* 8.6*   No results for input(s): LABPT, INR in the last 72 hours.  ABD soft Sensation intact distally Intact pulses distally Dorsiflexion/Plantar flexion intact Compartment soft calf non tender Dressing dry Increased swelling though out left lower extremity with notable ecchymosis No pain increase in knee  Assessment/Plan: 4 Days Post-Op Procedure(s) (LRB): LEFT TOTAL KNEE ARTHROPLASTY (Left)  Post blood loss anemia Tachycardic with low grade temperature elevations, so will proceed with CT to rule out PE 2 units ordered  Up with therapy  BLAIR Quintin Hjort 08/22/2017, 11:05 AM  [phone]

## 2017-08-22 NOTE — Progress Notes (Addendum)
08/22/17 1747  PT Visit Information  Last PT Received On 08/22/17  Assistance Needed +1  History of Present Illness Pt is a 66 y/o female s/p elective L TKA. PMH includes HTN, DM, OSA, and L corneal ulcer.   Precautions  Precautions Knee  Precaution Booklet Issued Yes (comment)  Precaution Comments verbally reviewed with Pt   Required Braces or Orthoses Knee Immobilizer - Left  Knee Immobilizer - Left (until discontinued)  Restrictions  Weight Bearing Restrictions Yes  LLE Weight Bearing WBAT  Pain Assessment  Pain Assessment 0-10  Pain Score 8  Pain Location L knee  Pain Descriptors / Indicators Aching;Operative site guarding;Grimacing  Cognition  Arousal/Alertness Awake/alert  Behavior During Therapy WFL for tasks assessed/performed  Overall Cognitive Status Within Functional Limits for tasks assessed  Bed Mobility  Overal bed mobility Needs Assistance  Bed Mobility Supine to Sit  Supine to sit Supervision  Sit to supine Supervision  General bed mobility comments close supervision for safety; Pt able to self assist LLE out of bed using hook method   Transfers  Overall transfer level Needs assistance  Equipment used Rolling walker (2 wheeled)  Transfers Sit to/from [facility]  Sit to Stand Min guard  Stand pivot transfers Min guard  General transfer comment Cues for hand placement to and from seated surface.    Ambulation/Gait  Ambulation/Gait assistance Min guard  Ambulation Distance (Feet) 400 Feet  Assistive device Rolling walker (2 wheeled)  Gait Pattern/deviations Decreased step length - right;Decreased step length - left;Decreased stride length;Antalgic;Step-through pattern  General Gait Details Pt remains able to maintain step through pattern.  Encouragement to advance mobility this afternoon post blood transfusion.  Pt required cues for upper trunk control.    Gait velocity Decreased  Gait velocity interpretation Below normal speed for age/gender   Balance  Overall balance assessment Needs assistance  Sitting-balance support No upper extremity supported;Feet supported  Sitting balance-Leahy Scale Fair  Standing balance support Bilateral upper extremity supported;During functional activity  Standing balance-Leahy Scale Fair  Standing balance comment reliant on UE support on RW during mobility   Total Joint Exercises  Ankle Circles/Pumps AROM;Both;20 reps;Supine  Quad Sets AROM;Left;10 reps;Supine  Towel Squeeze AROM;Left;10 reps;Supine  Short Arc Quad AROM;Left;10 reps;Supine  Heel Slides AROM;Left;10 reps;Supine  Hip ABduction/ADduction AROM;Left;10 reps;Supine  Straight Leg Raises AROM;Left;10 reps;Supine  PT - End of Session  Equipment Utilized During Treatment Gait belt  Activity Tolerance Patient limited by pain  Patient left in bed;with call bell/phone within reach;in CPM;with SCD's reapplied  Nurse Communication Mobility status;Patient requests pain meds  CPM Left Knee  CPM Left Knee On  Left Knee Flexion (Degrees) 75  Left Knee Extension (Degrees) 0  Additional Comments ice applied patient on CPM on arrival and PTA placed patient back on CPM post session for additonal trial at increased ROm to 75 degrees from 60 degrees.  CPM not adjusted for fit and patient reports increased comfort with appllication from PTA.    PT - Assessment/Plan  PT Plan Current plan remains appropriate  PT Visit Diagnosis Other abnormalities of gait and mobility (R26.89);Pain  Pain - Right/Left Left  Pain - part of body Knee  PT Frequency (ACUTE ONLY) 7X/week  Follow Up Recommendations DC plan and follow up therapy as arranged by surgeon;Supervision for mobility/OOB  PT equipment None recommended by PT  AM-PAC PT "6 Clicks" Daily Activity Outcome Measure  Difficulty turning over in bed (including adjusting bedclothes, sheets and blankets)? 3  Difficulty moving from  lying on back to sitting on the side of the bed?  3  Difficulty sitting down  on and standing up from a chair with arms (e.g., wheelchair, bedside commode, etc,.)? 3  Help needed moving to and from a bed to chair (including a wheelchair)? 3  Help needed walking in hospital room? 3  Help needed climbing 3-5 steps with a railing?  3  6 Click Score 18  Mobility G Code  CK  PT Goal Progression  Progress towards PT goals Progressing toward goals  Acute Rehab PT Goals  PT Goal Formulation With patient  Time For Goal Achievement 08/25/17  Potential to Achieve Goals Good  PT Time Calculation  PT Start Time (ACUTE ONLY) 1531  PT Stop Time (ACUTE ONLY) 1613  PT Time Calculation (min) (ACUTE ONLY) 42 min  PT General Charges  $$ ACUTE PT VISIT 1 Visit  PT Treatments  $Gait Training 8-22 mins  $Therapeutic Exercise 8-22 mins  $Therapeutic Activity 8-22 mins   Plan next session for stair training.    Governor Rooks, PTA pager [phone]

## 2017-08-22 NOTE — Consult Note (Signed)
History and Physical  Katherine Bell HDQ:[id number] DOB: October 27, 1951 DOA: 08/18/2017  Referring physician: NP Levester Fresh PCP: Monico Blitz, MD  Outpatient Specialists: Opthalmology clinic Patient coming from: Home  Reason for consultation: Thrombocytopenia in the setting of incidentally found splenomegaly  HPI: Katherine Bell is a 66 y.o. female with medical history significant for osteoarthritis status post left total knee arthroplasty, thrombocytopenia, among others who is POD # 4 post left total knee arthroplasty. Medicine team was consulted due to incidentally found splenomegaly and persistent thrombocytopenia.  Denies petechia or mucosal bleed.  Was not aware of history of thrombocytopenia and denies family history of blood illness or GI cancer. Denies abdominal pain, nausea or vomiting, history of infectious mononucleosis, known viral infection, or alcohol abuse.   Review of Systems: As mentioned in HPI. Review of systems are otherwise negative   Past Medical History:  Diagnosis Date  . Acute blood loss as cause of postoperative anemia 08/19/2017  . Anemia of chronic disease 08/19/2017  . Anxiety   . Corneal ulcer, left   . Diabetes mellitus without complication ([place])   . GERD (gastroesophageal reflux disease)   . History of kidney stones   . Hypercholesteremia   . Hypertension   . Hypothyroidism   . Osteoarthritis of left knee   . Primary localized osteoarthrosis of the knee, left 08/06/2017  . Sleep apnea, obstructive    used for a year -off for a year   Past Surgical History:  Procedure Laterality Date  . ABDOMINAL HYSTERECTOMY  1996  . BLADDER SUSPENSION  1997   4 different procedures  . CATARACT EXTRACTION W/ INTRAOCULAR LENS IMPLANT Left 2004  . DACROCYSTORHINOSTOMY W/ JONES TUBE Left 2004   for tear duct  . EYE SURGERY    . TOTAL KNEE ARTHROPLASTY Left 08/18/2017   Procedure: LEFT TOTAL KNEE ARTHROPLASTY;  Surgeon: Elsie Saas, MD;  Location: [facility];  Service:  Orthopedics;  Laterality: Left;    Social History:  reports that she has never smoked. She has never used smokeless tobacco. She reports that she does not drink alcohol or use drugs.   Allergies  Allergen Reactions  . Penicillins Hives    Has taken keflex without difficulty       . Sulfa Antibiotics Hives    Family History  Problem Relation Age of Onset  . Suicidality Father   . Diabetes Brother   . Hypertension Brother   . Heart disease Brother      Prior to Admission medications   Medication Sig Start Date End Date Taking? Authorizing Provider  acyclovir (ZOVIRAX) 400 MG tablet Take 400 mg by mouth 2 (two) times daily.   Yes [provider]  atorvastatin (LIPITOR) 40 MG tablet Take 20 mg by mouth daily.    Yes [provider]  Calcium Carbonate (CALCIUM 600 PO) Take 1,200 mg by mouth daily.    Yes [provider]  fexofenadine (ALLEGRA) 180 MG tablet Take 180 mg by mouth daily as needed for allergies.    Yes [provider]  glipiZIDE (GLUCOTROL) 5 MG tablet Take 5 mg by mouth 2 (two) times daily before a meal.   Yes [provider]  hydrochlorothiazide (HYDRODIURIL) 25 MG tablet Take 25 mg by mouth daily.   Yes [provider]  levothyroxine (SYNTHROID, LEVOTHROID) 100 MCG tablet Take 100 mcg by mouth daily before breakfast.   Yes [provider]  lisinopril (PRINIVIL,ZESTRIL) 5 MG tablet Take 5 mg by mouth daily.  Yes [provider]  losartan (COZAAR) 25 MG tablet Take 25 mg by mouth daily.    Yes [provider]  metFORMIN (GLUCOPHAGE) 1000 MG tablet Take 1,000 mg by mouth 2 (two) times daily with a meal.   Yes [provider]  Multiple Vitamins-Minerals (CENTRUM SILVER ULTRA WOMENS PO) Take 0.5 tablets by mouth 2 (two) times daily.    Yes [provider]  pantoprazole (PROTONIX) 40 MG tablet Take 40 mg by mouth 2 (two) times daily.   Yes [provider]  sertraline  (ZOLOFT) 100 MG tablet Take 100 mg by mouth 2 (two) times daily.   Yes [provider]  VOLTAREN 1 % GEL Apply 1 application topically 3 (three) times daily. 07/10/17  Yes [provider]  acetaminophen (TYLENOL) 325 MG tablet Take 2 tablets (650 mg total) by mouth every 6 (six) hours as needed for mild pain (or Fever >/= 101). 08/19/17   Shepperson, Kirstin, PA-C  aspirin 81 MG chewable tablet Chew 81 mg by mouth daily.     [provider]  aspirin EC 325 MG EC tablet 1 tab a day for the next 30 days to prevent blood clots 08/19/17   Shepperson, Kirstin, PA-C  docusate sodium (COLACE) 100 MG capsule 1 tab 2 times a day while on narcotics.  STOOL SOFTENER 08/19/17   Shepperson, Kirstin, PA-C  oxyCODONE (OXY IR/ROXICODONE) 5 MG immediate release tablet Take 1-2 tablets (5-10 mg total) by mouth every 3 (three) hours as needed for breakthrough pain. 08/19/17   Shepperson, Kirstin, PA-C  polyethylene glycol (MIRALAX / GLYCOLAX) packet 17grams in 6 oz of water twice a day until bowel movement.  LAXITIVE.  Restart if two days since last bowel movement 08/19/17   Shepperson, Kirstin, PA-C    Physical Exam: BP 135/72   Pulse (!) 111   Temp 98.4 F (36.9 C) (Oral)   Resp 20   Ht 5\' 7"  (1.702 m)   Wt 84.4 kg (186 lb)   SpO2 97%   BMI 29.13 kg/m   Physical Exam  Constitutional: Alert and oriented x4. Well-developed and well-nourished.  HENT: Normocephalic and atraumatic. Mouth/Throat: Oropharynx is clear and moist.  Eyes: Pupils are equal, round, and reactive to light. Conjunctivae are normal.  Neck: Neck supple.  Cardiovascular: Normal rate and regular rhythm. No murmurs or gallops Respiratory: Clear to auscultation bilaterally GI: Obese, soft, non tender with normal bowel sounds x 4 quadrants Genitourinary: Not relevant to present diagnosis Musculoskeletal:  Right knee has range of motion within normal limits.   Left knee is in surgical wrap.  Neurological: She is  alert and oriented to person, place, and time.  Skin: Skin is warm and dry.  Psychiatric: She has a normal mood and affect.           Labs on Admission:  Basic Metabolic Panel:  Recent Labs Lab 08/19/17 0655 08/20/17 0518 08/21/17 0501 08/22/17 0628  NA 137 137 140 138  K 4.7 4.7 3.9 4.0  CL 104 102 103 98*  CO2 25 25 28  32  GLUCOSE 235* 235* 133* 174*  BUN 9 12 7 7   CREATININE 0.77 0.81 0.72 0.73  CALCIUM 8.2* 8.5* 8.2* 8.6*   Liver Function Tests:  Recent Labs Lab 08/22/17 0628  AST 29  ALT 27  ALKPHOS 84  BILITOT 1.2  PROT 5.9*  ALBUMIN 3.1*   No results for input(s): LIPASE, AMYLASE in the last 168 hours. No results for input(s): AMMONIA in the  last 168 hours. CBC:  Recent Labs Lab 08/20/17 0518 08/20/17 1910 08/21/17 0501 08/21/17 2302 08/22/17 0628  WBC 9.2 3.7* 2.9* 3.0* 2.5*  NEUTROABS  --  2.7  --   --  1.8  HGB 9.2* 7.4* 7.2* 9.2* 8.8*  HCT 29.0* 23.3* 22.5* 29.4* 27.9*  MCV 74.9* 74.9* 75.3* 76.8* 76.9*  PLT 192 87* 82* 97* 94*   Cardiac Enzymes: No results for input(s): CKTOTAL, CKMB, CKMBINDEX, TROPONINI in the last 168 hours.  BNP (last 3 results) No results for input(s): BNP in the last 8760 hours.  ProBNP (last 3 results) No results for input(s): PROBNP in the last 8760 hours.  CBG:  Recent Labs Lab 08/21/17 2125 08/22/17 0625 08/22/17 0918 08/22/17 1400 08/22/17 1654  GLUCAP 169* 159* 211* 122* 196*    Radiological Exams on Admission: Dg Chest 2 View  Result Date: 08/20/2017 CLINICAL DATA:  Fever EXAM: CHEST  2 VIEW COMPARISON:  None. FINDINGS: The heart size and mediastinal contours are within normal limits. Both lungs are clear. The visualized skeletal structures are unremarkable. IMPRESSION: No active cardiopulmonary disease. Electronically Signed   By: Franchot Gallo M.D.   On: 08/20/2017 18:45   Ct Angio Chest Pe W Or Wo Contrast  Result Date: 08/22/2017 CLINICAL DATA:  Fever and shortness of breath EXAM: CT  ANGIOGRAPHY CHEST WITH CONTRAST TECHNIQUE: Multidetector CT imaging of the chest was performed using the standard protocol during bolus administration of intravenous contrast. Multiplanar CT image reconstructions and MIPs were obtained to evaluate the vascular anatomy. CONTRAST:  100 mL Isovue 370 nonionic COMPARISON:  August 20, 2017 FINDINGS: Cardiovascular: There is no demonstrable pulmonary embolus. There is no thoracic aortic aneurysm or dissection. There is atherosclerotic calcification in the proximal left subclavian artery. Visualized great vessels otherwise appear unremarkable. There is atherosclerotic calcification in the aorta as well as foci of coronary artery calcification. Pericardium is not appreciably thickened. Mediastinum/Nodes: Thyroid is not visualized. There are subcentimeter mediastinal lymph nodes without adenopathy by size criteria. No esophageal lesions are evident. Lungs/Pleura: There is no edema or consolidation. No pleural effusion or pleural thickening evident. Upper Abdomen: Spleen is incompletely visualized but noted to be enlarged. From anterior to posterior dimension, spleen measures 18.7 cm in length. Visualized upper abdominal structures otherwise appear unremarkable. Musculoskeletal: There is degenerative change in the thoracic spine with a degree of diffuse idiopathic skeletal hyperostosis. There are no evident blastic or lytic bone lesions. Review of the MIP images confirms the above findings. IMPRESSION: 1.  No demonstrable pulmonary embolus. 2.  Splenomegaly, incompletely visualized. 3.  No lung edema or consolidation. 4.  No evident adenopathy. 5. Aortic atherosclerosis. Great vessel calcification as well as foci of coronary artery calcification. Aortic Atherosclerosis (ICD10-I70.0). Electronically Signed   By: Lowella Grip III M.D.   On: 08/22/2017 14:06    Assessment/Plan Present on Admission: . Sleep apnea, obstructive . Osteoarthritis of left knee .  Hypothyroidism . Hypertension . Hypercholesteremia . GERD (gastroesophageal reflux disease) . Corneal ulcer, left . Anxiety . Primary localized osteoarthrosis of the knee, left . Anemia of chronic disease  Principal Problem:   Primary localized osteoarthrosis of the knee, left Active Problems:   Sleep apnea, obstructive   Osteoarthritis of left knee   Hypothyroidism   Hypertension   Hypercholesteremia   GERD (gastroesophageal reflux disease)   Diabetes mellitus without complication ([facility])   Corneal ulcer, left   Anxiety   Acute blood loss as cause of postoperative anemia   Anemia of chronic  disease  Assessment and Plan:  Thrombocytopenia in the setting of incidentally found splenomegaly - No prior hx of infectious mononucleosis or known viral infection - CBC with peripheral smear ordered - No mucosal bleed or petechiae; no prior abdominal trauma or alcohol abuse. - If blood test equivocal, consider involving hepatologist.  Thank you very much for involving [place] in the care of this patient. We will continue to follow this patient.   DVT prophylaxis: SCDs  Code Status: Full  Family Communication: None  Disposition Plan: Per primary team.     Kayleen Memos MD [facility] Pager [phone]  If 7PM-7AM, please contact night-coverage [email or web address] Password [email or web address]  08/22/2017, 5:32 PM

## 2017-08-22 NOTE — Care Management Important Message (Signed)
Important Message  Patient Details  Name: Katherine Bell MRN: [id number] Date of Birth: 1951/01/27   Medicare Important Message Given:  Yes    Aubrei Bouchie 08/22/2017, 1:56 PM

## 2017-08-23 DIAGNOSIS — D696 Thrombocytopenia, unspecified: Secondary | ICD-10-CM

## 2017-08-23 LAB — TYPE AND SCREEN
ABO/RH(D): B POS
ANTIBODY SCREEN: NEGATIVE
UNIT DIVISION: 0
Unit division: 0
Unit division: 0
Unit division: 0

## 2017-08-23 LAB — CBC WITH DIFFERENTIAL/PLATELET
Basophils Absolute: 0 10*3/uL (ref 0.0–0.1)
Basophils Relative: 0 %
Eosinophils Absolute: 0 10*3/uL (ref 0.0–0.7)
Eosinophils Relative: 0 %
HEMATOCRIT: 31.3 % — AB (ref 36.0–46.0)
Hemoglobin: 10.1 g/dL — ABNORMAL LOW (ref 12.0–15.0)
LYMPHS ABS: 0.4 10*3/uL — AB (ref 0.7–4.0)
LYMPHS PCT: 14 %
MCH: 25.1 pg — AB (ref 26.0–34.0)
MCHC: 32.3 g/dL (ref 30.0–36.0)
MCV: 77.9 fL — AB (ref 78.0–100.0)
Monocytes Absolute: 0.2 10*3/uL (ref 0.1–1.0)
Monocytes Relative: 8 %
NEUTROS ABS: 2.1 10*3/uL (ref 1.7–7.7)
NEUTROS PCT: 78 %
Platelets: 102 10*3/uL — ABNORMAL LOW (ref 150–400)
RBC: 4.02 MIL/uL (ref 3.87–5.11)
RDW: 15.7 % — AB (ref 11.5–15.5)
WBC: 2.7 10*3/uL — AB (ref 4.0–10.5)

## 2017-08-23 LAB — BPAM RBC
BLOOD PRODUCT EXPIRATION DATE: 201811062359
Blood Product Expiration Date: 201811062359
Blood Product Expiration Date: 201811062359
Blood Product Expiration Date: 201811072359
ISSUE DATE / TIME: 201810191511
ISSUE DATE / TIME: 201810181127
ISSUE DATE / TIME: 201810181647
UNIT TYPE AND RH: 7300
UNIT TYPE AND RH: 7300
Unit Type and Rh: 7300
Unit Type and Rh: 7300

## 2017-08-23 LAB — COMPREHENSIVE METABOLIC PANEL
ALT: 29 U/L (ref 14–54)
ANION GAP: 8 (ref 5–15)
AST: 28 U/L (ref 15–41)
Albumin: 3.1 g/dL — ABNORMAL LOW (ref 3.5–5.0)
Alkaline Phosphatase: 93 U/L (ref 38–126)
BILIRUBIN TOTAL: 1.8 mg/dL — AB (ref 0.3–1.2)
BUN: 8 mg/dL (ref 6–20)
CALCIUM: 8.6 mg/dL — AB (ref 8.9–10.3)
CO2: 32 mmol/L (ref 22–32)
Chloride: 96 mmol/L — ABNORMAL LOW (ref 101–111)
Creatinine, Ser: 0.73 mg/dL (ref 0.44–1.00)
Glucose, Bld: 189 mg/dL — ABNORMAL HIGH (ref 65–99)
POTASSIUM: 3.7 mmol/L (ref 3.5–5.1)
Sodium: 136 mmol/L (ref 135–145)
TOTAL PROTEIN: 5.9 g/dL — AB (ref 6.5–8.1)

## 2017-08-23 LAB — GLUCOSE, CAPILLARY
GLUCOSE-CAPILLARY: 141 mg/dL — AB (ref 65–99)
Glucose-Capillary: 186 mg/dL — ABNORMAL HIGH (ref 65–99)
Glucose-Capillary: 203 mg/dL — ABNORMAL HIGH (ref 65–99)

## 2017-08-23 LAB — LACTATE DEHYDROGENASE: LDH: 255 U/L — ABNORMAL HIGH (ref 98–192)

## 2017-08-23 LAB — SAVE SMEAR

## 2017-08-23 LAB — MONONUCLEOSIS SCREEN: Mono Screen: NEGATIVE

## 2017-08-23 NOTE — Progress Notes (Signed)
Physical Therapy Treatment Patient Details Name: Katherine Bell MRN: [id number] DOB: May 09, 1951 Today's Date: 08/23/2017    History of Present Illness Pt is a 66 y/o female s/p elective L TKA. PMH includes HTN, DM, OSA, and L corneal ulcer.     PT Comments    Patient with increased pain this pm however willing to participate. Pt tolerated short distance gait and L LE therex. Pt overall required supervision/min guard for safe mobility. Family present during session. Continue to progress as tolerated.    Follow Up Recommendations  DC plan and follow up therapy as arranged by surgeon;Supervision for mobility/OOB     Equipment Recommendations  None recommended by PT    Recommendations for Other Services       Precautions / Restrictions Precautions Precautions: Knee Precaution Comments: verbally reviewed with Pt  Restrictions Weight Bearing Restrictions: Yes LLE Weight Bearing: Weight bearing as tolerated    Mobility  Bed Mobility Overal bed mobility: Needs Assistance Bed Mobility: Supine to Sit     Supine to sit: Supervision Sit to supine: Supervision   General bed mobility comments: supervision for safety  Transfers Overall transfer level: Needs assistance Equipment used: Rolling walker (2 wheeled) Transfers: Sit to/from Stand Sit to Stand: Min guard         General transfer comment: min guard for safety due to pt feeling dizzy upon standing; vitals checked and WNL  Ambulation/Gait Ambulation/Gait assistance: Min guard Ambulation Distance (Feet): 20 Feet Assistive device: Rolling walker (2 wheeled) Gait Pattern/deviations: Decreased step length - right;Antalgic;Step-through pattern;Decreased stance time - left;Decreased dorsiflexion - left;Decreased weight shift to left Gait velocity: Decreased   General Gait Details: cues for posture and L heel strike   Stairs            Wheelchair Mobility    Modified Rankin (Stroke Patients Only)        Balance Overall balance assessment: Needs assistance Sitting-balance support: No upper extremity supported;Feet supported Sitting balance-Leahy Scale: Good     Standing balance support: Bilateral upper extremity supported;During functional activity Standing balance-Leahy Scale: Fair Standing balance comment: reliant on UE support on RW during mobility                             Cognition Arousal/Alertness: Awake/alert Behavior During Therapy: WFL for tasks assessed/performed Overall Cognitive Status: Within Functional Limits for tasks assessed                                        Exercises Total Joint Exercises Ankle Circles/Pumps: AROM;Both;10 reps Quad Sets: AROM;Left;10 reps;Supine Short Arc Quad: AROM;Left;10 reps;Supine Heel Slides: AROM;Left;10 reps;Supine Hip ABduction/ADduction: AROM;Left;10 reps;Supine Straight Leg Raises: AROM;Left;10 reps;Supine Long Arc Quad: AROM;Left;10 reps;Supine Knee Flexion: AROM;Left;5 reps;Seated (10 second holds) Goniometric ROM: 90 degrees flexion in sitting     General Comments        Pertinent Vitals/Pain Pain Assessment: Faces Faces Pain Scale: Hurts even more Pain Location: posterior L knee Pain Descriptors / Indicators: Aching;Grimacing;Guarding (pulling) Pain Intervention(s): Limited activity within patient's tolerance;Monitored during session;Repositioned;Patient requesting pain meds-RN notified    Home Living                      Prior Function            PT Goals (current goals can now be found in the  care plan section) Acute Rehab PT Goals Patient Stated Goal: to go home PT Goal Formulation: With patient Time For Goal Achievement: 08/25/17 Potential to Achieve Goals: Good Progress towards PT goals: Progressing toward goals    Frequency    7X/week      PT Plan Current plan remains appropriate    Co-evaluation              AM-PAC PT "6 Clicks" Daily Activity   Outcome Measure  Difficulty turning over in bed (including adjusting bedclothes, sheets and blankets)?: A Little Difficulty moving from lying on back to sitting on the side of the bed? : A Little Difficulty sitting down on and standing up from a chair with arms (e.g., wheelchair, bedside commode, etc,.)?: A Little Help needed moving to and from a bed to chair (including a wheelchair)?: A Little Help needed walking in hospital room?: A Little Help needed climbing 3-5 steps with a railing? : A Little 6 Click Score: 18    End of Session Equipment Utilized During Treatment: Gait belt Activity Tolerance: Patient tolerated treatment well Patient left: with call bell/phone within reach;in bed;with family/visitor present Nurse Communication: Mobility status;Patient requests pain meds PT Visit Diagnosis: Other abnormalities of gait and mobility (R26.89);Pain Pain - Right/Left: Left Pain - part of body: Knee     Time: 3893-7342 PT Time Calculation (min) (ACUTE ONLY): 33 min  Charges:  $Gait Training: 8-22 mins $Therapeutic Exercise: 8-22 mins                    G Codes:       Earney Navy, PTA Pager: [phone]     Darliss Cheney 08/23/2017, 2:55 PM

## 2017-08-23 NOTE — Progress Notes (Signed)
Patient ID: [name], female   DOB: [birth year], 66 y.o.   MRN: [id number]                                                                PROGRESS NOTE                                                                                                                                                                                                             Patient Demographics:    Katherine Bell, is a 66 y.o. female, DOB - 02-Dec-1950, WUJ:[id number]  Admit date - 08/18/2017   Admitting Physician Elsie Saas, MD  Outpatient Primary MD for the patient is Monico Blitz, MD  LOS - 5  Outpatient Specialists:     No chief complaint on file.      Brief Narrative  66 y.o. female with medical history significant for osteoarthritis status post left total knee arthroplasty, thrombocytopenia, among others who is POD # 4 post left total knee arthroplasty. Medicine team was consulted due to incidentally found splenomegaly and persistent thrombocytopenia.  Denies petechia or mucosal bleed.  Was not aware of history of thrombocytopenia and denies family history of blood illness or GI cancer. Denies abdominal pain, nausea or vomiting, history of infectious mononucleosis, known viral infection, or alcohol abuse.   Subjective:    Katherine Bell today has no evidence of bleeding or petechiae.  Thrombocytopenia stable  No headache, No chest pain, No abdominal pain - No Nausea, No new weakness tingling or numbness, No Cough - SOB.    Assessment  & Plan :    Principal Problem:   Primary localized osteoarthrosis of the knee, left Active Problems:   Sleep apnea, obstructive   Osteoarthritis of left knee   Hypothyroidism   Hypertension   Hypercholesteremia   GERD (gastroesophageal reflux disease)   Diabetes mellitus without complication (HCC)   Corneal ulcer, left   Anxiety   Acute blood loss as cause of postoperative anemia   Anemia of chronic disease   Splenomegaly   Thrombocytosis  (HCC)   Thrombocytopenia w/o active bleeding This range of thrombocytopenia from 95-621 can certainly be from splenomegaly Pt had thrombocytopenia 122 on admission Awaiting smear, will review when ready Add LDH, Add Hiv , Add Hep C Thrombocytopenia most likely secondary to  splenomegaly and dilution If cbc stable, then fine for discharge, I would recommend f/u cbc in 1 week after discharge to ensure stabiliy  Will continue to follow      Code Status : FULL  CODE  Family Communication  : w patient  Disposition Plan  : per orthopedics   DVT Prophylaxis  :  SCDs   Lab Results  Component Value Date   PLT 99 (L) 08/22/2017      Anti-infectives    Start     Dose/Rate Route Frequency Ordered Stop   08/18/17 2200  acyclovir (ZOVIRAX) tablet 400 mg     400 mg Oral 2 times daily 08/18/17 1601     08/18/17 1800  ceFAZolin (ANCEF) IVPB 2g/100 mL premix     2 g 200 mL/hr over 30 Minutes Intravenous Every 6 hours 08/18/17 1201 08/19/17 0107   08/18/17 0845  vancomycin (VANCOCIN) IVPB 1000 mg/200 mL premix     1,000 mg 200 mL/hr over 60 Minutes Intravenous On call to O.R. 08/15/17 1034 08/18/17 0906        Objective:   Vitals:   08/22/17 1502 08/22/17 1543 08/22/17 1745 08/22/17 2000  BP: (!) 103/54 135/72 137/67 135/84  Pulse: 98 (!) 111 98 97  Resp: 20 20 20 14   Temp: 99.3 F (37.4 C) 98.4 F (36.9 C) 98.3 F (36.8 C) 99 F (37.2 C)  TempSrc: Oral Oral Oral Oral  SpO2: 91% 97% 96% 98%  Weight:      Height:        Wt Readings from Last 3 Encounters:  08/18/17 84.4 kg (186 lb)  08/08/17 84.7 kg (186 lb 11.2 oz)     Intake/Output Summary (Last 24 hours) at 08/23/17 0643 Last data filed at 08/22/17 1800  Gross per 24 hour  Intake             1210 ml  Output             1000 ml  Net              210 ml     Physical Exam  Awake Alert, Oriented X 3, No new F.N deficits, Normal affect [place].AT,PERRAL Supple Neck,No JVD, No cervical lymphadenopathy  appriciated.  Symmetrical Chest wall movement, Good air movement bilaterally, CTAB RRR,No Gallops,Rubs or new Murmurs, No Parasternal Heave +ve B.Sounds, Abd Soft, No tenderness, No organomegaly appriciated, No rebound - guarding or rigidity. No Cyanosis, Clubbing or edema, No new Rash or bruise   No petechiae    Data Review:    CBC  Recent Labs Lab 08/20/17 1910 08/21/17 0501 08/21/17 2302 08/22/17 0628 08/22/17 1916  WBC 3.7* 2.9* 3.0* 2.5* 2.6*  HGB 7.4* 7.2* 9.2* 8.8* 10.0*  HCT 23.3* 22.5* 29.4* 27.9* 30.6*  PLT 87* 82* 97* 94* 99*  MCV 74.9* 75.3* 76.8* 76.9* 77.1*  MCH 23.8* 24.1* 24.0* 24.2* 25.2*  MCHC 31.8 32.0 31.3 31.5 32.7  RDW 15.2 15.3 15.6* 15.3 15.9*  LYMPHSABS 0.7  --   --  0.5* 0.5*  MONOABS 0.2  --   --  0.2 0.2  EOSABS 0.0  --   --  0.0 0.0  BASOSABS 0.0  --   --  0.0 0.0    Chemistries   Recent Labs Lab 08/19/17 0655 08/20/17 0518 08/21/17 0501 08/22/17 0628  NA 137 137 140 138  K 4.7 4.7 3.9 4.0  CL 104 102 103 98*  CO2 25 25 28  32  GLUCOSE 235* 235*  133* 174*  BUN 9 12 7 7   CREATININE 0.77 0.81 0.72 0.73  CALCIUM 8.2* 8.5* 8.2* 8.6*  AST  --   --   --  29  ALT  --   --   --  27  ALKPHOS  --   --   --  84  BILITOT  --   --   --  1.2   ------------------------------------------------------------------------------------------------------------------ No results for input(s): CHOL, HDL, LDLCALC, TRIG, CHOLHDL, LDLDIRECT in the last 72 hours.  Lab Results  Component Value Date   HGBA1C 6.1 (H) 08/08/2017   ------------------------------------------------------------------------------------------------------------------ No results for input(s): TSH, T4TOTAL, T3FREE, THYROIDAB in the last 72 hours.  Invalid input(s): FREET3 ------------------------------------------------------------------------------------------------------------------ No results for input(s): VITAMINB12, FOLATE, FERRITIN, TIBC, IRON, RETICCTPCT in the last 72  hours.  Coagulation profile No results for input(s): INR, PROTIME in the last 168 hours.  No results for input(s): DDIMER in the last 72 hours.  Cardiac Enzymes No results for input(s): CKMB, TROPONINI, MYOGLOBIN in the last 168 hours.  Invalid input(s): CK ------------------------------------------------------------------------------------------------------------------ No results found for: BNP  Inpatient Medications  Scheduled Meds: . acyclovir  400 mg Oral BID  . atorvastatin  20 mg Oral Daily  . calcium carbonate  1 tablet Oral Q breakfast  . docusate sodium  100 mg Oral BID  . insulin aspart  0-20 Units Subcutaneous TID WC  . insulin aspart  0-5 Units Subcutaneous QHS  . insulin aspart  6 Units Subcutaneous TID WC  . insulin glargine  30 Units Subcutaneous QHS  . levothyroxine  100 mcg Oral QAC breakfast  . lisinopril  5 mg Oral Daily  . loratadine  10 mg Oral Daily  . losartan  25 mg Oral Daily  . pantoprazole  40 mg Oral BID  . polyethylene glycol  17 g Oral BID  . sertraline  100 mg Oral BID   Continuous Infusions: . 0.9 % NaCl with KCl 20 mEq / L 125 mL/hr at 08/18/17 1642   PRN Meds:.acetaminophen **OR** acetaminophen, alum & mag hydroxide-simeth, diphenhydrAMINE, HYDROmorphone (DILAUDID) injection, menthol-cetylpyridinium **OR** phenol, metoCLOPramide **OR** metoCLOPramide (REGLAN) injection, ondansetron **OR** ondansetron (ZOFRAN) IV, oxyCODONE  Micro Results Recent Results (from the past 240 hour(s))  Culture, Urine     Status: Abnormal   Collection Time: 08/20/17  8:42 PM  Result Value Ref Range Status   Specimen Description URINE, CLEAN CATCH  Final   Special Requests NONE  Final   Culture <10,000 COLONIES/mL INSIGNIFICANT GROWTH (A)  Final   Report Status 08/22/2017 FINAL  Final    Radiology Reports Dg Chest 2 View  Result Date: 08/20/2017 CLINICAL DATA:  Fever EXAM: CHEST  2 VIEW COMPARISON:  None. FINDINGS: The heart size and mediastinal contours  are within normal limits. Both lungs are clear. The visualized skeletal structures are unremarkable. IMPRESSION: No active cardiopulmonary disease. Electronically Signed   By: Franchot Gallo M.D.   On: 08/20/2017 18:45   Ct Angio Chest Pe W Or Wo Contrast  Result Date: 08/22/2017 CLINICAL DATA:  Fever and shortness of breath EXAM: CT ANGIOGRAPHY CHEST WITH CONTRAST TECHNIQUE: Multidetector CT imaging of the chest was performed using the standard protocol during bolus administration of intravenous contrast. Multiplanar CT image reconstructions and MIPs were obtained to evaluate the vascular anatomy. CONTRAST:  100 mL Isovue 370 nonionic COMPARISON:  August 20, 2017 FINDINGS: Cardiovascular: There is no demonstrable pulmonary embolus. There is no thoracic aortic aneurysm or dissection. There is atherosclerotic calcification in the proximal left subclavian artery.  Visualized great vessels otherwise appear unremarkable. There is atherosclerotic calcification in the aorta as well as foci of coronary artery calcification. Pericardium is not appreciably thickened. Mediastinum/Nodes: Thyroid is not visualized. There are subcentimeter mediastinal lymph nodes without adenopathy by size criteria. No esophageal lesions are evident. Lungs/Pleura: There is no edema or consolidation. No pleural effusion or pleural thickening evident. Upper Abdomen: Spleen is incompletely visualized but noted to be enlarged. From anterior to posterior dimension, spleen measures 18.7 cm in length. Visualized upper abdominal structures otherwise appear unremarkable. Musculoskeletal: There is degenerative change in the thoracic spine with a degree of diffuse idiopathic skeletal hyperostosis. There are no evident blastic or lytic bone lesions. Review of the MIP images confirms the above findings. IMPRESSION: 1.  No demonstrable pulmonary embolus. 2.  Splenomegaly, incompletely visualized. 3.  No lung edema or consolidation. 4.  No evident  adenopathy. 5. Aortic atherosclerosis. Great vessel calcification as well as foci of coronary artery calcification. Aortic Atherosclerosis (ICD10-I70.0). Electronically Signed   By: Lowella Grip III M.D.   On: 08/22/2017 14:06    Time Spent in minutes  30   Jani Gravel M.D on 08/23/2017 at 6:43 AM  Between 7am to 7pm - Pager - [phone]  After 7pm go to [email or web address] - password [facility]  [facility] -  Office  [phone]

## 2017-08-23 NOTE — Progress Notes (Signed)
PROGRESS NOTE  Katherine Bell [id number] DOB: 1951/08/27 DOA: 08/18/2017 PCP: Katherine Blitz, MD  HPI/Recap of past 24 hours: Katherine Bell is a 66 y.o. female with medical history significant for osteoarthritis status post left total knee arthroplasty, thrombocytopenia, among others who is POD # 5 post left total knee arthroplasty. Medicine team was consulted due to incidentally found splenomegaly and persistent thrombocytopenia.  Denies petechia or mucosal bleed.   No acute events overnight.  Continues to deny any mucosal bleed.  No history of alcohol use.  Mononucleosis test and peripheral blood smear have been ordered.  Platelet count trending up.  Physical Exam Constitutional: Alert and oriented x4. Well-developedand well-nourished.  HENT: Normocephalicand atraumatic. Mouth/Throat: Oropharynx is clear and moist.  Eyes: Pupils are equal, round, and reactive to light. Conjunctivaeare normal.  Neck: Neck supple.  Cardiovascular: Normal rateand regular rhythm. No murmurs or gallops Respiratory: Clear to auscultation bilaterally GI: Obese, soft, non tender with normal bowel sounds x 4 quadrants Genitourinary: Not relevant to present diagnosis Musculoskeletal: Right knee has range of motion within normal limits.   Left knee is in surgical wrap. Neurological: She is alertand oriented to person, place, and time.  Skin: Skin is warmand dry.    Objective: Vitals:   08/22/17 1745 08/22/17 2000 08/23/17 0700 08/23/17 1348  BP: 137/67 135/84 (!) 149/68 127/65  Pulse: 98 97 (!) 102 (!) 106  Resp: 20 14 14 16   Temp: 98.3 F (36.8 C) 99 F (37.2 C) 98.7 F (37.1 C) 99.1 F (37.3 C)  TempSrc: Oral Oral Oral Oral  SpO2: 96% 98% 91% 92%  Weight:      Height:        Intake/Output Summary (Last 24 hours) at 08/23/17 1556 Last data filed at 08/22/17 1800  Gross per 24 hour  Intake              600 ml  Output                0 ml  Net              600 ml   Filed Weights   08/06/17 1500 08/18/17 0651  Weight: 84.8 kg (187 lb) 84.4 kg (186 lb)    Data Reviewed: CBC:  Recent Labs Lab 08/20/17 1910 08/21/17 0501 08/21/17 2302 08/22/17 0628 08/22/17 1916 08/23/17 0634  WBC 3.7* 2.9* 3.0* 2.5* 2.6* 2.7*  NEUTROABS 2.7  --   --  1.8 1.9 2.1  HGB 7.4* 7.2* 9.2* 8.8* 10.0* 10.1*  HCT 23.3* 22.5* 29.4* 27.9* 30.6* 31.3*  MCV 74.9* 75.3* 76.8* 76.9* 77.1* 77.9*  PLT 87* 82* 97* 94* 99* 063*   Basic Metabolic Panel:  Recent Labs Lab 08/19/17 0655 08/20/17 0518 08/21/17 0501 08/22/17 0628 08/23/17 0634  NA 137 137 140 138 136  K 4.7 4.7 3.9 4.0 3.7  CL 104 102 103 98* 96*  CO2 25 25 28  32 32  GLUCOSE 235* 235* 133* 174* 189*  BUN 9 12 7 7 8   CREATININE 0.77 0.81 0.72 0.73 0.73  CALCIUM 8.2* 8.5* 8.2* 8.6* 8.6*   GFR: Estimated Creatinine Clearance: 75.1 mL/min (by C-G formula based on SCr of 0.73 mg/dL). Liver Function Tests:  Recent Labs Lab 08/22/17 0628 08/23/17 0634  AST 29 28  ALT 27 29  ALKPHOS 84 93  BILITOT 1.2 1.8*  PROT 5.9* 5.9*  ALBUMIN 3.1* 3.1*   No results for input(s): LIPASE, AMYLASE in the last 168 hours. No results for  input(s): AMMONIA in the last 168 hours. Coagulation Profile: No results for input(s): INR, PROTIME in the last 168 hours. Cardiac Enzymes: No results for input(s): CKTOTAL, CKMB, CKMBINDEX, TROPONINI in the last 168 hours. BNP (last 3 results) No results for input(s): PROBNP in the last 8760 hours. HbA1C: No results for input(s): HGBA1C in the last 72 hours. CBG:  Recent Labs Lab 08/22/17 0918 08/22/17 1400 08/22/17 1654 08/22/17 2130 08/23/17 0608  GLUCAP 211* 122* 196* 159* 141*   Lipid Profile: No results for input(s): CHOL, HDL, LDLCALC, TRIG, CHOLHDL, LDLDIRECT in the last 72 hours. Thyroid Function Tests: No results for input(s): TSH, T4TOTAL, FREET4, T3FREE, THYROIDAB in the last 72 hours. Anemia Panel: No results for input(s): VITAMINB12, FOLATE, FERRITIN, TIBC, IRON,  RETICCTPCT in the last 72 hours. Urine analysis:    Component Value Date/Time   COLORURINE COLORLESS (A) 08/20/2017 2041   APPEARANCEUR CLEAR 08/20/2017 2041   LABSPEC 1.002 (L) 08/20/2017 2041   PHURINE 6.0 08/20/2017 2041   GLUCOSEU NEGATIVE 08/20/2017 2041   HGBUR NEGATIVE 08/20/2017 2041   [place] NEGATIVE 08/20/2017 2041   KETONESUR NEGATIVE 08/20/2017 2041   PROTEINUR NEGATIVE 08/20/2017 2041   NITRITE NEGATIVE 08/20/2017 2041   LEUKOCYTESUR NEGATIVE 08/20/2017 2041   Sepsis Labs: @LABRCNTIP (procalcitonin:4,lacticidven:4)  ) Recent Results (from the past 240 hour(s))  Culture, Urine     Status: Abnormal   Collection Time: 08/20/17  8:42 PM  Result Value Ref Range Status   Specimen Description URINE, CLEAN CATCH  Final   Special Requests NONE  Final   Culture <10,000 COLONIES/mL INSIGNIFICANT GROWTH (A)  Final   Report Status 08/22/2017 FINAL  Final      Studies: No results found.  Scheduled Meds: . acyclovir  400 mg Oral BID  . atorvastatin  20 mg Oral Daily  . calcium carbonate  1 tablet Oral Q breakfast  . docusate sodium  100 mg Oral BID  . insulin aspart  0-20 Units Subcutaneous TID WC  . insulin aspart  0-5 Units Subcutaneous QHS  . insulin aspart  6 Units Subcutaneous TID WC  . insulin glargine  30 Units Subcutaneous QHS  . levothyroxine  100 mcg Oral QAC breakfast  . lisinopril  5 mg Oral Daily  . loratadine  10 mg Oral Daily  . losartan  25 mg Oral Daily  . pantoprazole  40 mg Oral BID  . polyethylene glycol  17 g Oral BID  . sertraline  100 mg Oral BID    Continuous Infusions: . 0.9 % NaCl with KCl 20 mEq / L 125 mL/hr at 08/23/17 0646     LOS: 5 days   Assessment and Plan:  Thrombocytopenia in the setting of incidentally found splenomegaly - No prior hx of infectious mononucleosis or known viral infection - Peripheral smear, mononucleosis test - No mucosal bleed or petechiae; no prior abdominal trauma or alcohol use -We recommend  involving hepatology or hematology.  Thank you very much for involving [place] in the care of this patient.    DVT prophylaxis: SCDs  Code Status: Full  Family Communication: None  Disposition Plan: Per primary team, surgery.      Kayleen Memos, MD [facility] Pager [phone]-  If 7PM-7AM, please contact night-coverage [email or web address] Password [email or web address] 08/23/2017, 3:56 PM

## 2017-08-23 NOTE — Progress Notes (Signed)
Subjective: 5 Days Post-Op Procedure(s) (LRB): LEFT TOTAL KNEE ARTHROPLASTY (Left) Patient reports pain as moderate.   Slight nausea this am-no emesis Pain in left leg stable  Objective: Vital signs in last 24 hours: Temp:  [98.3 F (36.8 C)-99.3 F (37.4 C)] 98.7 F (37.1 C) (10/20 0700) Pulse Rate:  [97-111] 102 (10/20 0700) Resp:  [14-20] 14 (10/20 0700) BP: (103-149)/(54-84) 149/68 (10/20 0700) SpO2:  [91 %-98 %] 91 % (10/20 0700)  Intake/Output from previous day: 10/19 0701 - 10/20 0700 In: 1210 [P.O.:1200; I.V.:10] Out: 1000 [Urine:1000] Intake/Output this shift: No intake/output data recorded.   Recent Labs  08/21/17 0501 08/21/17 2302 08/22/17 0628 08/22/17 1916 08/23/17 0634  HGB 7.2* 9.2* 8.8* 10.0* 10.1*    Recent Labs  08/22/17 1916 08/23/17 0634  WBC 2.6* 2.7*  RBC 3.97 4.02  HCT 30.6* 31.3*  PLT 99* 102*    Recent Labs  08/22/17 0628 08/23/17 0634  NA 138 136  K 4.0 3.7  CL 98* 96*  CO2 32 32  BUN 7 8  CREATININE 0.73 0.73  GLUCOSE 174* 189*  CALCIUM 8.6* 8.6*   No results for input(s): LABPT, INR in the last 72 hours.  Sensation intact distally Intact pulses distally Dorsiflexion/Plantar flexion intact Compartment soft  Assessment/Plan: 5 Days Post-Op Procedure(s) (LRB): LEFT TOTAL KNEE ARTHROPLASTY (Left)  Post blood loss anemia-responding to transfusion Thrombocytopenia-improving  Medicine continues to follow for thrombocytopenia and splenomegaly  Up with therapy, progressing toward goals Continue to encourage mechanical DVT prevention  Hopeful discharge to home Monday if anemia continues to improve.  Katherine Bell 08/23/2017, 8:55 AM

## 2017-08-23 NOTE — Progress Notes (Signed)
Physical Therapy Treatment Patient Details Name: Katherine Bell MRN: [id number] DOB: 1951/10/24 Today's Date: 08/23/2017    History of Present Illness Pt is a 66 y/o female s/p elective L TKA. PMH includes HTN, DM, OSA, and L corneal ulcer.     PT Comments    Patient is progressing toward mobility goals. Pt tolerated gait and stair training this am and required min guard/min A. Continue to progress as tolerated.    Follow Up Recommendations  DC plan and follow up therapy as arranged by surgeon;Supervision for mobility/OOB     Equipment Recommendations  None recommended by PT    Recommendations for Other Services       Precautions / Restrictions Precautions Precautions: Knee Precaution Comments: verbally reviewed with Pt  Restrictions Weight Bearing Restrictions: Yes LLE Weight Bearing: Weight bearing as tolerated    Mobility  Bed Mobility               General bed mobility comments: pt in bathroom with NT upon arrival  Transfers Overall transfer level: Needs assistance Equipment used: Rolling walker (2 wheeled) Transfers: Sit to/from [facility] Sit to Stand: Min guard         General transfer comment: min guard for safety; pt stood X2 from [facility] with cues for safe hand placement; pt reported dizziness upon initial stand  Ambulation/Gait Ambulation/Gait assistance: Min guard Ambulation Distance (Feet): 275 Feet Assistive device: Rolling walker (2 wheeled) Gait Pattern/deviations: Decreased step length - right;Antalgic;Step-through pattern;Decreased stance time - left;Decreased dorsiflexion - left;Decreased weight shift to left Gait velocity: Decreased   General Gait Details: cues for posture, L heel strike, and increased L knee flexion during swing phase   Stairs     Stair Management: No rails;With walker;Backwards Number of Stairs: 2 General stair comments: cues for sequencing and technique; assist to stabilize RW  Wheelchair  Mobility    Modified Rankin (Stroke Patients Only)       Balance Overall balance assessment: Needs assistance Sitting-balance support: No upper extremity supported;Feet supported Sitting balance-Leahy Scale: Good     Standing balance support: Bilateral upper extremity supported;During functional activity Standing balance-Leahy Scale: Fair                              Cognition Arousal/Alertness: Awake/alert Behavior During Therapy: WFL for tasks assessed/performed Overall Cognitive Status: Within Functional Limits for tasks assessed                                        Exercises Total Joint Exercises Ankle Circles/Pumps: AROM;Both;10 reps    General Comments        Pertinent Vitals/Pain Pain Assessment: Faces Faces Pain Scale: Hurts even more Pain Location: L knee  Pain Descriptors / Indicators: Aching;Grimacing;Guarding Pain Intervention(s): Limited activity within patient's tolerance;Monitored during session;Repositioned;Patient requesting pain meds-RN notified    Home Living                      Prior Function            PT Goals (current goals can now be found in the care plan section) Acute Rehab PT Goals Patient Stated Goal: to go home PT Goal Formulation: With patient Time For Goal Achievement: 08/25/17 Potential to Achieve Goals: Good Progress towards PT goals: Progressing toward goals    Frequency  7X/week      PT Plan Current plan remains appropriate    Co-evaluation              AM-PAC PT "6 Clicks" Daily Activity  Outcome Measure  Difficulty turning over in bed (including adjusting bedclothes, sheets and blankets)?: A Little Difficulty moving from lying on back to sitting on the side of the bed? : A Little Difficulty sitting down on and standing up from a chair with arms (e.g., wheelchair, bedside commode, etc,.)?: A Little Help needed moving to and from a bed to chair (including a  wheelchair)?: A Little Help needed walking in hospital room?: A Little Help needed climbing 3-5 steps with a railing? : A Little 6 Click Score: 18    End of Session Equipment Utilized During Treatment: Gait belt Activity Tolerance: Patient tolerated treatment well Patient left: with call bell/phone within reach;in chair Nurse Communication: Mobility status;Patient requests pain meds PT Visit Diagnosis: Other abnormalities of gait and mobility (R26.89);Pain Pain - Right/Left: Left Pain - part of body: Knee     Time: 3343-5686 PT Time Calculation (min) (ACUTE ONLY): 32 min  Charges:  $Gait Training: 8-22 mins $Therapeutic Activity: 8-22 mins                    G Codes:       Earney Navy, PTA Pager: [phone]     Darliss Cheney 08/23/2017, 9:46 AM

## 2017-08-24 ENCOUNTER — Inpatient Hospital Stay (HOSPITAL_COMMUNITY): Payer: Medicare Other

## 2017-08-24 LAB — CBC WITH DIFFERENTIAL/PLATELET
BASOS ABS: 0 10*3/uL (ref 0.0–0.1)
BASOS PCT: 0 %
Eosinophils Absolute: 0 10*3/uL (ref 0.0–0.7)
Eosinophils Relative: 0 %
HEMATOCRIT: 29.1 % — AB (ref 36.0–46.0)
HEMOGLOBIN: 9.2 g/dL — AB (ref 12.0–15.0)
LYMPHS PCT: 14 %
Lymphs Abs: 0.3 10*3/uL — ABNORMAL LOW (ref 0.7–4.0)
MCH: 24.8 pg — ABNORMAL LOW (ref 26.0–34.0)
MCHC: 31.6 g/dL (ref 30.0–36.0)
MCV: 78.4 fL (ref 78.0–100.0)
MONO ABS: 0.2 10*3/uL (ref 0.1–1.0)
Monocytes Relative: 7 %
NEUTROS ABS: 1.8 10*3/uL (ref 1.7–7.7)
Neutrophils Relative %: 78 %
Platelets: 109 10*3/uL — ABNORMAL LOW (ref 150–400)
RBC: 3.71 MIL/uL — AB (ref 3.87–5.11)
RDW: 16.1 % — AB (ref 11.5–15.5)
WBC: 2.3 10*3/uL — AB (ref 4.0–10.5)

## 2017-08-24 LAB — COMPREHENSIVE METABOLIC PANEL
ALBUMIN: 2.8 g/dL — AB (ref 3.5–5.0)
ALT: 34 U/L (ref 14–54)
AST: 37 U/L (ref 15–41)
Alkaline Phosphatase: 103 U/L (ref 38–126)
Anion gap: 6 (ref 5–15)
BILIRUBIN TOTAL: 1.1 mg/dL (ref 0.3–1.2)
BUN: 9 mg/dL (ref 6–20)
CO2: 30 mmol/L (ref 22–32)
Calcium: 8.3 mg/dL — ABNORMAL LOW (ref 8.9–10.3)
Chloride: 100 mmol/L — ABNORMAL LOW (ref 101–111)
Creatinine, Ser: 0.8 mg/dL (ref 0.44–1.00)
GFR calc Af Amer: >60 mL/min (ref >60)
GFR calc non Af Amer: >60 mL/min (ref >60)
GLUCOSE: 207 mg/dL — AB (ref 65–99)
POTASSIUM: 4.8 mmol/L (ref 3.5–5.1)
Sodium: 136 mmol/L (ref 135–145)
TOTAL PROTEIN: 5.4 g/dL — AB (ref 6.5–8.1)

## 2017-08-24 LAB — GLUCOSE, CAPILLARY
GLUCOSE-CAPILLARY: 186 mg/dL — AB (ref 65–99)
GLUCOSE-CAPILLARY: 99 mg/dL (ref 65–99)
GLUCOSE-CAPILLARY: 212 mg/dL — AB (ref 65–99)
Glucose-Capillary: 166 mg/dL — ABNORMAL HIGH (ref 65–99)

## 2017-08-24 LAB — HEPATITIS C ANTIBODY

## 2017-08-24 LAB — HIV ANTIBODY (ROUTINE TESTING W REFLEX): HIV SCREEN 4TH GENERATION: NONREACTIVE

## 2017-08-24 NOTE — Progress Notes (Signed)
Patient stated this morning to staff that on Friday October 19th while being transported to radiology the transporter pulled on her operative leg and it has been feeling progressively worst.  Patient ambulated this am to and from the bathroom with 1 assist and appeared to have minimum problems however she insist that since the transport on Friday her leg is feeling worst.

## 2017-08-24 NOTE — Progress Notes (Signed)
Occupational Therapy Treatment Patient Details Name: Katherine Bell MRN: [id number] DOB: 08-29-1951 Today's Date: 08/24/2017    History of present illness Pt is a 66 y/o female s/p elective L TKA. PMH includes HTN, DM, OSA, and L corneal ulcer.    OT comments  Pt progressing towards established goals. However, continues to be limited by significant pain in LLE. Pt performed toilet and shower transfers with Min Guard A for safety and VCs for technique. Pt requiring increased time due to pain. Continue to recommend dc with HHOT to optimize safety and independence with ADLs and functional mobility. Will continue to follow acutely to facilitate safe dc.    Follow Up Recommendations  Home health OT    Equipment Recommendations  None recommended by OT    Recommendations for Other Services      Precautions / Restrictions Precautions Precautions: Knee Precaution Comments: Reviewed resting with knee in extension Restrictions Weight Bearing Restrictions: Yes LLE Weight Bearing: Weight bearing as tolerated       Mobility Bed Mobility Overal bed mobility: Needs Assistance Bed Mobility: Supine to Sit;Sit to Supine     Supine to sit: Supervision Sit to supine: Min assist   General bed mobility comments: Min A to elevate LLE due tp significant pain  Transfers Overall transfer level: Needs assistance Equipment used: Rolling walker (2 wheeled) Transfers: Sit to/from Stand Sit to Stand: Min guard         General transfer comment: Min Guard for safety    Balance Overall balance assessment: Needs assistance Sitting-balance support: No upper extremity supported;Feet supported Sitting balance-Leahy Scale: Good     Standing balance support: Bilateral upper extremity supported;During functional activity Standing balance-Leahy Scale: Fair Standing balance comment: reliant on UE support on RW during mobility                            ADL either performed or assessed  with clinical judgement   ADL Overall ADL's : Needs assistance/impaired     Grooming: Wash/dry hands;Min guard;Standing                   Toilet Transfer: Min guard;BSC;RW;Ambulation   Toileting- Water quality scientist and Hygiene: Min guard;Sit to/from stand   Tub/ Shower Transfer: Walk-in shower;Min guard;Ambulation;3 in 1;Rolling walker;Cueing for sequencing Tub/Shower Transfer Details (indicate cue type and reason): Pt demosntrating good technique to enter shower. Pt requiring VCs to recall technique for exiting shower safety. Pt appreciative of information.  Functional mobility during ADLs: Min guard;Rolling walker General ADL Comments: Pt limited by significant pain. Pt performing transfers with increased time. Provided massage to LLE to assist with edema and pain.      Vision       Perception     Praxis      Cognition Arousal/Alertness: Awake/alert Behavior During Therapy: WFL for tasks assessed/performed Overall Cognitive Status: Within Functional Limits for tasks assessed                                          Exercises     Shoulder Instructions       General Comments Pt husband and daughter present throughout session. Increased bruising around L thigh.    Pertinent Vitals/ Pain       Pain Assessment: 0-10 Pain Score: 8  Pain Location: posterior L knee Pain Descriptors / Indicators:  Aching;Grimacing;Guarding Pain Intervention(s): Monitored during session;Limited activity within patient's tolerance;Repositioned;Ice applied  Home Living                                          Prior Functioning/Environment              Frequency  Min 2X/week        Progress Toward Goals  OT Goals(current goals can now be found in the care plan section)  Progress towards OT goals: Progressing toward goals  Acute Rehab OT Goals Patient Stated Goal: to go home OT Goal Formulation: With patient Time For Goal  Achievement: 09/02/17 Potential to Achieve Goals: Good ADL Goals Pt Will Perform Lower Body Bathing: with modified independence;with adaptive equipment;sit to/from stand Pt Will Perform Lower Body Dressing: with modified independence;with adaptive equipment;sit to/from stand Pt Will Perform Toileting - Clothing Manipulation and hygiene: with modified independence;sit to/from stand Pt Will Perform Tub/Shower Transfer: with modified independence;ambulating;shower seat;rolling walker  Plan Discharge plan remains appropriate    Co-evaluation                 AM-PAC PT "6 Clicks" Daily Activity     Outcome Measure   Help from another person eating meals?: None Help from another person taking care of personal grooming?: A Little Help from another person toileting, which includes using toliet, bedpan, or urinal?: A Little Help from another person bathing (including washing, rinsing, drying)?: A Little Help from another person to put on and taking off regular upper body clothing?: A Little Help from another person to put on and taking off regular lower body clothing?: A Little 6 Click Score: 19    End of Session Equipment Utilized During Treatment: Gait belt;Rolling walker  OT Visit Diagnosis: Unsteadiness on feet (R26.81);Other abnormalities of gait and mobility (R26.89)   Activity Tolerance Patient tolerated treatment well;Patient limited by pain   Patient Left in bed;with call bell/phone within reach;with bed alarm set;with nursing/sitter in room;with family/visitor present   Nurse Communication Mobility status        Time: 2671-2458 OT Time Calculation (min): 22 min  Charges: OT General Charges $OT Visit: 1 Visit OT Treatments $Self Care/Home Management : 8-22 mins  [place], OTR/L Acute Rehab Pager: [phone] Office: [place] 08/24/2017, 3:29 PM

## 2017-08-24 NOTE — Progress Notes (Signed)
Subjective: 6 Days Post-Op Procedure(s) (LRB): LEFT TOTAL KNEE ARTHROPLASTY (Left) Patient reports pain as moderate.  Doing ok this am.  Still feeling a little nauseous.  No vomiting.  No lightheadedness/dizziness, chest pain/sob.  Operative knee still sore since being grabbed by tech while in CT on Thursday.  I have reinforced to her that I think it is just more inflamed and that structurally things are ok.    Objective: Vital signs in last 24 hours: Temp:  [99.1 F (37.3 C)-99.9 F (37.7 C)] 99.9 F (37.7 C) (10/21 0553) Pulse Rate:  [81-106] 101 (10/21 0553) Resp:  [16-18] 17 (10/21 0553) BP: (127-138)/(65-66) 138/66 (10/21 0553) SpO2:  [92 %-100 %] 94 % (10/21 0553)  Intake/Output from previous day: 10/20 0701 - 10/21 0700 In: 1320 [I.V.:1320] Out: 400 [Urine:400] Intake/Output this shift: Total I/O In: 240 [P.O.:240] Out: 275 [Urine:275]   Recent Labs  08/21/17 2302 08/22/17 0628 08/22/17 1916 08/23/17 0634 08/24/17 0625  HGB 9.2* 8.8* 10.0* 10.1* 9.2*    Recent Labs  08/23/17 0634 08/24/17 0625  WBC 2.7* 2.3*  RBC 4.02 3.71*  HCT 31.3* 29.1*  PLT 102* 109*    Recent Labs  08/23/17 0634 08/24/17 0625  NA 136 136  K 3.7 4.8  CL 96* 100*  CO2 32 30  BUN 8 9  CREATININE 0.73 0.80  GLUCOSE 189* 207*  CALCIUM 8.6* 8.3*   No results for input(s): LABPT, INR in the last 72 hours.  Neurologically intact Neurovascular intact Sensation intact distally Intact pulses distally Dorsiflexion/Plantar flexion intact Incision: dressing C/D/I No cellulitis present Compartment soft    Assessment/Plan: 6 Days Post-Op Procedure(s) (LRB): LEFT TOTAL KNEE ARTHROPLASTY (Left) Advance diet Up with therapy  WBAT LLE Hb trending down Going for abd U/S today Likely ok for d/c tomorrow according to medicine if cbc is stable.   Will need repeat cbc in one week and will need hamatology fu as an op    Fannie Knee 08/24/2017, 9:36 AM

## 2017-08-24 NOTE — Progress Notes (Signed)
Physical Therapy Treatment Patient Details Name: Katherine Bell MRN: [id number] DOB: 02-24-1951 Today's Date: 08/24/2017    History of Present Illness Pt is a 65 y/o female s/p elective L TKA. PMH includes HTN, DM, OSA, and L corneal ulcer.     PT Comments    Pt presents with increased c/o pain this session and reports pain has been worse since her test on Thursday. Pt is scheduled to go for a CT this afternoon of her spleen and is unable to eat or drink anything until then. Pt requests just performing supine exercises this visit but plans to get up with nursing to use the bathroom as needed. Performed supine exercises this session and initiated patella mobility to assist with knee ROM and reduce edema in LLE. Pt with significant swelling as noted by 2+ pitting edema around left knee. Pt continues to benefit from PT follow-up to progress gait and mobility prior to D/C.     Follow Up Recommendations  DC plan and follow up therapy as arranged by surgeon;Supervision for mobility/OOB     Equipment Recommendations  None recommended by PT    Recommendations for Other Services       Precautions / Restrictions Precautions Precautions: Knee Precaution Booklet Issued: Yes (comment) Precaution Comments: verbally reviewed with Pt  Restrictions Weight Bearing Restrictions: Yes LLE Weight Bearing: Weight bearing as tolerated    Mobility  Bed Mobility               General bed mobility comments: pt refused to perform this session due to pain   Transfers                 General transfer comment: did not perform this session due to pain  Ambulation/Gait             General Gait Details: Pt deferred this session due to pain   Stairs            Wheelchair Mobility    Modified Rankin (Stroke Patients Only)       Balance                                            Cognition Arousal/Alertness: Awake/alert Behavior During Therapy: WFL for  tasks assessed/performed Overall Cognitive Status: Within Functional Limits for tasks assessed                                        Exercises Total Joint Exercises Ankle Circles/Pumps: AROM;Both;10 reps Quad Sets: AROM;Left;10 reps;Supine Short Arc Quad: AROM;Left;10 reps;Supine Heel Slides: AROM;Left;10 reps;Supine Hip ABduction/ADduction: AROM;Left;10 reps;Supine Straight Leg Raises: AAROM;Left;10 reps;Supine Other Exercises Other Exercises: patella mobilization 10x all directions to improve knee mobility     General Comments        Pertinent Vitals/Pain Pain Assessment: 0-10 Pain Score: 8  Pain Location: posterior L knee Pain Descriptors / Indicators: Aching;Grimacing;Guarding Pain Intervention(s): Monitored during session;Premedicated before session;Repositioned;Ice applied    Home Living                      Prior Function            PT Goals (current goals can now be found in the care plan section) Acute Rehab PT Goals Patient Stated Goal: to go  home Progress towards PT goals: Progressing toward goals    Frequency    7X/week      PT Plan Current plan remains appropriate    Co-evaluation              AM-PAC PT "6 Clicks" Daily Activity  Outcome Measure  Difficulty turning over in bed (including adjusting bedclothes, sheets and blankets)?: A Little Difficulty moving from lying on back to sitting on the side of the bed? : Unable Difficulty sitting down on and standing up from a chair with arms (e.g., wheelchair, bedside commode, etc,.)?: A Little Help needed moving to and from a bed to chair (including a wheelchair)?: A Little Help needed walking in hospital room?: A Little Help needed climbing 3-5 steps with a railing? : A Little 6 Click Score: 16    End of Session   Activity Tolerance: Patient tolerated treatment well Patient left: in bed;with call bell/phone within reach;with SCD's reapplied Nurse Communication:  Mobility status PT Visit Diagnosis: Other abnormalities of gait and mobility (R26.89);Pain Pain - Right/Left: Left Pain - part of body: Knee     Time: 3403-7096 PT Time Calculation (min) (ACUTE ONLY): 22 min  Charges:  $Therapeutic Exercise: 8-22 mins                    G Codes:       Scheryl Marten PT, DPT      Kahiau Schewe Sloan Leiter 08/24/2017, 10:33 AM

## 2017-08-24 NOTE — Progress Notes (Signed)
Patient off floor to ultrasound.

## 2017-08-24 NOTE — Progress Notes (Signed)
Patient ID: Katherine Bell, female   DOB: [birth year], 66 y.o.   MRN: [id number]                                                                PROGRESS NOTE                                                                                                                                                                                                             Patient Demographics:    Katherine Bell, is a 66 y.o. female, DOB - [birth year], WUJ:[id number]  Admit date - 08/18/2017   Admitting Physician Elsie Saas, MD  Outpatient Primary MD for the patient is Monico Blitz, MD  LOS - 6  Outpatient Specialists:     No chief complaint on file. Thrombocytopenia     Brief Narrative  66 y.o.femalewith medical history significant for osteoarthritis status post left total knee arthroplasty, thrombocytopenia, among others who is POD # 4 post left total knee arthroplasty. Medicine team was consulted due to incidentally found splenomegaly and persistent thrombocytopenia.Denies petechia or mucosal bleed.Was not aware of history of thrombocytopenia and denies family history of blood illness or GI cancer.Denies abdominal pain,nausea or vomiting, history of infectious mononucleosis, known viralinfection, or alcohol abuse.   Subjective:    Katherine Bell today has bleeding or petechiea.  Thrombocytopenia improving.    Mononucleosis negative  Pt does note that her leg is bothering her some but this is being addressed by orthopedics.    No headache, No chest pain, No abdominal pain - No Nausea, No new weakness tingling or numbness, No Cough - SOB.    Assessment  & Plan :    Principal Problem:   Primary localized osteoarthrosis of the knee, left Active Problems:   Sleep apnea, obstructive   Osteoarthritis of left knee   Hypothyroidism   Hypertension   Hypercholesteremia   GERD (gastroesophageal reflux disease)   Diabetes mellitus without complication (HCC)   Corneal ulcer, left   Anxiety   Acute  blood loss as cause of postoperative anemia   Anemia of chronic disease   Splenomegaly   Thrombocytosis (HCC)   Thrombocytopenia (HCC)    Thrombocytopenia w/o active bleeding This range of thrombocytopenia from 95-621 can certainly be from splenomegaly Pt had thrombocytopenia 122 on admission, low  82 and has improved yesterday to 102 I would recommend f/u cbc in 1 week after discharge to ensure stabiliy   Splenomegaly Will order Abdominal ultrasound to verify Can have hematology consult as outpatient    Code Status : FULL CODE  Family Communication  : w patient  Disposition Plan  : discharge tomorrow if blood counts stable   Lab Results  Component Value Date   PLT 102 (L) 08/23/2017    Anti-infectives    Start     Dose/Rate Route Frequency Ordered Stop   08/18/17 2200  acyclovir (ZOVIRAX) tablet 400 mg     400 mg Oral 2 times daily 08/18/17 1601     08/18/17 1800  ceFAZolin (ANCEF) IVPB 2g/100 mL premix     2 g 200 mL/hr over 30 Minutes Intravenous Every 6 hours 08/18/17 1201 08/19/17 0107   08/18/17 0845  vancomycin (VANCOCIN) IVPB 1000 mg/200 mL premix     1,000 mg 200 mL/hr over 60 Minutes Intravenous On call to O.R. 08/15/17 1034 08/18/17 0906        Objective:   Vitals:   08/23/17 0700 08/23/17 1348 08/23/17 2141 08/24/17 0553  BP: (!) 149/68 127/65 129/65 138/66  Pulse: (!) 102 (!) 106 81 (!) 101  Resp: 14 16 18 17   Temp: 98.7 F (37.1 C) 99.1 F (37.3 C) 99.5 F (37.5 C) 99.9 F (37.7 C)  TempSrc: Oral Oral Oral Oral  SpO2: 91% 92% 100% 94%  Weight:      Height:        Wt Readings from Last 3 Encounters:  08/18/17 84.4 kg (186 lb)  08/08/17 84.7 kg (186 lb 11.2 oz)     Intake/Output Summary (Last 24 hours) at 08/24/17 0737 Last data filed at 08/24/17 0500  Gross per 24 hour  Intake             1320 ml  Output              400 ml  Net              920 ml     Physical Exam  Awake Alert, Oriented X 3, No new F.N deficits, Normal  affect [place]rey.AT,PERRAL Supple Neck,No JVD, No cervical lymphadenopathy appriciated.  Symmetrical Chest wall movement, Good air movement bilaterally, CTAB RRR,No Gallops,Rubs or new Murmurs, No Parasternal Heave +ve B.Sounds, Abd Soft, No tenderness,minimal splenomegaly  appriciated, No rebound - guarding or rigidity.  No Cyanosis, Clubbing or edema, No new Rash or bruise      Data Review:    CBC  Recent Labs Lab 08/20/17 1910 08/21/17 0501 08/21/17 2302 08/22/17 0628 08/22/17 1916 08/23/17 0634  WBC 3.7* 2.9* 3.0* 2.5* 2.6* 2.7*  HGB 7.4* 7.2* 9.2* 8.8* 10.0* 10.1*  HCT 23.3* 22.5* 29.4* 27.9* 30.6* 31.3*  PLT 87* 82* 97* 94* 99* 102*  MCV 74.9* 75.3* 76.8* 76.9* 77.1* 77.9*  MCH 23.8* 24.1* 24.0* 24.2* 25.2* 25.1*  MCHC 31.8 32.0 31.3 31.5 32.7 32.3  RDW 15.2 15.3 15.6* 15.3 15.9* 15.7*  LYMPHSABS 0.7  --   --  0.5* 0.5* 0.4*  MONOABS 0.2  --   --  0.2 0.2 0.2  EOSABS 0.0  --   --  0.0 0.0 0.0  BASOSABS 0.0  --   --  0.0 0.0 0.0    Chemistries   Recent Labs Lab 08/19/17 0655 08/20/17 0518 08/21/17 0501 08/22/17 0628 08/23/17 0634  NA 137 137 140 138 136  K 4.7 4.7 3.9  4.0 3.7  CL 104 102 103 98* 96*  CO2 25 25 28  32 32  GLUCOSE 235* 235* 133* 174* 189*  BUN 9 12 7 7 8   CREATININE 0.77 0.81 0.72 0.73 0.73  CALCIUM 8.2* 8.5* 8.2* 8.6* 8.6*  AST  --   --   --  29 28  ALT  --   --   --  27 29  ALKPHOS  --   --   --  84 93  BILITOT  --   --   --  1.2 1.8*   ------------------------------------------------------------------------------------------------------------------ No results for input(s): CHOL, HDL, LDLCALC, TRIG, CHOLHDL, LDLDIRECT in the last 72 hours.  Lab Results  Component Value Date   HGBA1C 6.1 (H) 08/08/2017   ------------------------------------------------------------------------------------------------------------------ No results for input(s): TSH, T4TOTAL, T3FREE, THYROIDAB in the last 72 hours.  Invalid input(s):  FREET3 ------------------------------------------------------------------------------------------------------------------ No results for input(s): VITAMINB12, FOLATE, FERRITIN, TIBC, IRON, RETICCTPCT in the last 72 hours.  Coagulation profile No results for input(s): INR, PROTIME in the last 168 hours.  No results for input(s): DDIMER in the last 72 hours.  Cardiac Enzymes No results for input(s): CKMB, TROPONINI, MYOGLOBIN in the last 168 hours.  Invalid input(s): CK ------------------------------------------------------------------------------------------------------------------ No results found for: BNP  Inpatient Medications  Scheduled Meds: . acyclovir  400 mg Oral BID  . atorvastatin  20 mg Oral Daily  . calcium carbonate  1 tablet Oral Q breakfast  . docusate sodium  100 mg Oral BID  . insulin aspart  0-20 Units Subcutaneous TID WC  . insulin aspart  0-5 Units Subcutaneous QHS  . insulin aspart  6 Units Subcutaneous TID WC  . insulin glargine  30 Units Subcutaneous QHS  . levothyroxine  100 mcg Oral QAC breakfast  . lisinopril  5 mg Oral Daily  . loratadine  10 mg Oral Daily  . losartan  25 mg Oral Daily  . pantoprazole  40 mg Oral BID  . polyethylene glycol  17 g Oral BID  . sertraline  100 mg Oral BID   Continuous Infusions: . 0.9 % NaCl with KCl 20 mEq / L 125 mL/hr at 08/24/17 0653   PRN Meds:.acetaminophen **OR** acetaminophen, alum & mag hydroxide-simeth, diphenhydrAMINE, HYDROmorphone (DILAUDID) injection, menthol-cetylpyridinium **OR** phenol, metoCLOPramide **OR** metoCLOPramide (REGLAN) injection, ondansetron **OR** ondansetron (ZOFRAN) IV, oxyCODONE  Micro Results Recent Results (from the past 240 hour(s))  Culture, Urine     Status: Abnormal   Collection Time: 08/20/17  8:42 PM  Result Value Ref Range Status   Specimen Description URINE, CLEAN CATCH  Final   Special Requests NONE  Final   Culture <10,000 COLONIES/mL INSIGNIFICANT GROWTH (A)  Final    Report Status 08/22/2017 FINAL  Final    Radiology Reports Dg Chest 2 View  Result Date: 08/20/2017 CLINICAL DATA:  Fever EXAM: CHEST  2 VIEW COMPARISON:  None. FINDINGS: The heart size and mediastinal contours are within normal limits. Both lungs are clear. The visualized skeletal structures are unremarkable. IMPRESSION: No active cardiopulmonary disease. Electronically Signed   By: Franchot Gallo M.D.   On: 08/20/2017 18:45   Ct Angio Chest Pe W Or Wo Contrast  Result Date: 08/22/2017 CLINICAL DATA:  Fever and shortness of breath EXAM: CT ANGIOGRAPHY CHEST WITH CONTRAST TECHNIQUE: Multidetector CT imaging of the chest was performed using the standard protocol during bolus administration of intravenous contrast. Multiplanar CT image reconstructions and MIPs were obtained to evaluate the vascular anatomy. CONTRAST:  100 mL Isovue 370 nonionic COMPARISON:  August 20, 2017 FINDINGS: Cardiovascular: There is no demonstrable pulmonary embolus. There is no thoracic aortic aneurysm or dissection. There is atherosclerotic calcification in the proximal left subclavian artery. Visualized great vessels otherwise appear unremarkable. There is atherosclerotic calcification in the aorta as well as foci of coronary artery calcification. Pericardium is not appreciably thickened. Mediastinum/Nodes: Thyroid is not visualized. There are subcentimeter mediastinal lymph nodes without adenopathy by size criteria. No esophageal lesions are evident. Lungs/Pleura: There is no edema or consolidation. No pleural effusion or pleural thickening evident. Upper Abdomen: Spleen is incompletely visualized but noted to be enlarged. From anterior to posterior dimension, spleen measures 18.7 cm in length. Visualized upper abdominal structures otherwise appear unremarkable. Musculoskeletal: There is degenerative change in the thoracic spine with a degree of diffuse idiopathic skeletal hyperostosis. There are no evident blastic or lytic  bone lesions. Review of the MIP images confirms the above findings. IMPRESSION: 1.  No demonstrable pulmonary embolus. 2.  Splenomegaly, incompletely visualized. 3.  No lung edema or consolidation. 4.  No evident adenopathy. 5. Aortic atherosclerosis. Great vessel calcification as well as foci of coronary artery calcification. Aortic Atherosclerosis (ICD10-I70.0). Electronically Signed   By: Lowella Grip III M.D.   On: 08/22/2017 14:06    Time Spent in minutes  30   Jani Gravel M.D on 08/24/2017 at 7:37 AM  Between 7am to 7pm - Pager - [phone]  After 7pm go to [email or web address] - password [facility]  [facility] -  Office  [phone]

## 2017-08-25 DIAGNOSIS — M1712 Unilateral primary osteoarthritis, left knee: Principal | ICD-10-CM

## 2017-08-25 LAB — GLUCOSE, CAPILLARY
GLUCOSE-CAPILLARY: 176 mg/dL — AB (ref 65–99)
GLUCOSE-CAPILLARY: 136 mg/dL — AB (ref 65–99)
Glucose-Capillary: 157 mg/dL — ABNORMAL HIGH (ref 65–99)
Glucose-Capillary: 148 mg/dL — ABNORMAL HIGH (ref 65–99)

## 2017-08-25 LAB — CBC WITH DIFFERENTIAL/PLATELET
Basophils Absolute: 0 10*3/uL (ref 0.0–0.1)
Basophils Relative: 0 %
Eosinophils Absolute: 0 10*3/uL (ref 0.0–0.7)
Eosinophils Relative: 1 %
HCT: 31.7 % — ABNORMAL LOW (ref 36.0–46.0)
HEMOGLOBIN: 9.9 g/dL — AB (ref 12.0–15.0)
LYMPHS ABS: 0.4 10*3/uL — AB (ref 0.7–4.0)
LYMPHS PCT: 15 %
MCH: 24.5 pg — AB (ref 26.0–34.0)
MCHC: 31.2 g/dL (ref 30.0–36.0)
MCV: 78.5 fL (ref 78.0–100.0)
MONOS PCT: 6 %
Monocytes Absolute: 0.2 10*3/uL (ref 0.1–1.0)
NEUTROS PCT: 78 %
Neutro Abs: 2.2 10*3/uL (ref 1.7–7.7)
Platelets: 120 10*3/uL — ABNORMAL LOW (ref 150–400)
RBC: 4.04 MIL/uL (ref 3.87–5.11)
RDW: 15.9 % — ABNORMAL HIGH (ref 11.5–15.5)
WBC: 2.7 10*3/uL — AB (ref 4.0–10.5)

## 2017-08-25 LAB — COMPREHENSIVE METABOLIC PANEL
ALK PHOS: 107 U/L (ref 38–126)
ALT: 30 U/L (ref 14–54)
ANION GAP: 9 (ref 5–15)
AST: 27 U/L (ref 15–41)
Albumin: 3.3 g/dL — ABNORMAL LOW (ref 3.5–5.0)
BILIRUBIN TOTAL: 1.5 mg/dL — AB (ref 0.3–1.2)
BUN: 7 mg/dL (ref 6–20)
CALCIUM: 9 mg/dL (ref 8.9–10.3)
CO2: 28 mmol/L (ref 22–32)
CREATININE: 0.71 mg/dL (ref 0.44–1.00)
Chloride: 97 mmol/L — ABNORMAL LOW (ref 101–111)
Glucose, Bld: 240 mg/dL — ABNORMAL HIGH (ref 65–99)
Potassium: 4.4 mmol/L (ref 3.5–5.1)
Sodium: 134 mmol/L — ABNORMAL LOW (ref 135–145)
TOTAL PROTEIN: 6 g/dL — AB (ref 6.5–8.1)

## 2017-08-25 MED ORDER — FLEET ENEMA 7-19 GM/118ML RE ENEM
1.0000 | ENEMA | Freq: Once | RECTAL | Status: AC
  Administered 2017-08-25: 1 via RECTAL
  Filled 2017-08-25: qty 1

## 2017-08-25 MED ORDER — CEPHALEXIN 500 MG PO CAPS
500.0000 mg | ORAL_CAPSULE | Freq: Four times a day (QID) | ORAL | Status: DC
  Administered 2017-08-25 – 2017-08-26 (×5): 500 mg via ORAL
  Filled 2017-08-25 (×5): qty 1

## 2017-08-25 NOTE — Progress Notes (Signed)
Orthopedic Tech Progress Note Patient Details:  Katherine Bell 1951-09-19 [id number]  pt was up in the chair at this time.  Unable to use CPM at this time.  NP informed Ortho Tech pt will get in to CPM after pt was done with lunch.    Kristopher Oppenheim 08/25/2017, 1:04 PM

## 2017-08-25 NOTE — Progress Notes (Addendum)
Patient ID: [name], female   DOB: 16-Jul-1951, 66 y.o.   MRN: [id number]                                                                PROGRESS NOTE                                                                                                                                                                                                             Patient Demographics:    Jocilynn Grade, is a 66 y.o. female, DOB - 03/04/1951, IRW:[id number]  Admit date - 08/18/2017   Admitting Physician Elsie Saas, MD  Outpatient Primary MD for the patient is Monico Blitz, MD  LOS - 7  Outpatient Specialists:     No chief complaint on file. Thrombocytopenia     Brief Narrative  66 y.o.femalewith medical history significant for osteoarthritis status post left total knee arthroplasty, thrombocytopenia, among others who is POD # 4 post left total knee arthroplasty. Medicine team was consulted due to incidentally found splenomegaly and persistent thrombocytopenia.Denies petechia or mucosal bleed.Was not aware of history of thrombocytopenia and denies family history of blood illness or GI cancer.Denies abdominal pain,nausea or vomiting, history of infectious mononucleosis, known viralinfection, or alcohol abuse.   Subjective:    Elmo Putt today has bleeding or petechiea.  Thrombocytopenia improving.    Mononucleosis negative  Pt does note that her leg is bothering her some but this is being addressed by orthopedics.    No headache, No chest pain, No abdominal pain - No Nausea, No new weakness tingling or numbness, No Cough - SOB.    Assessment  & Plan :    Principal Problem:   Primary localized osteoarthrosis of the knee, left Active Problems:   Sleep apnea, obstructive   Osteoarthritis of left knee   Hypothyroidism   Hypertension   Hypercholesteremia   GERD (gastroesophageal reflux disease)   Diabetes mellitus without complication (HCC)   Corneal ulcer, left   Anxiety   Acute  blood loss as cause of postoperative anemia   Anemia of chronic disease   Splenomegaly   Thrombocytosis (HCC)   Thrombocytopenia (HCC)   Assessment: 1.  Thrombocytopenia w/o active bleeding - this range of thrombocytopenia from 76-195 can certainly be from splenomegaly.  Spleen is enlarged  significantly by CT and confirmed by [place].  No splenic lesions/ masses seen.  Liver is normal, not cirrhotic.  Hep C, HIV and Mono screen were negative. Plts stable in 85- 100 range. Will need OP hematology appt as OP, have left a message on the answering machine in the Hem-Onc clinic. 2.  Low-grade fevers - postop low grade fevers, no abd pain,cough or urinary symptoms.  Surgery is starting Keflex today.  Will send UA and culture.  Lungs are clear.  Abd benign.   3.  Gallstones - by [place] , no evidence of GB infection though. 4.  Leukopenia - this is probably related as well to splenomegaly.  See plan as per #1.   5.  Herpes keratitis - pt take acyclovir for herpetic keratitis (corneal infection). She felt like she was having a flare of this postop and her husband brought the acyclovir from home.  Not sure if this would be enough to cause low-grade temps.  6.  SP L TKA - postop D#7 7.  DM2 - on metformin at home. Getting lantus 30/d, meal coverage and SSI here, BS 140 - 200.   8.  HTN - on losartan and lisinopril here , as at home. Holding HCTZ. 9.  Nausea - possibly due to constipation. Getting enema today. Will dc IV narcotics.  Cont po oxy prn.    Plan - as above  Kelly Splinter MD [facility] pgr [phone] 08/25/2017, 2:08 PM     Code Status : FULL CODE  Family Communication  : w patient  Disposition Plan  : discharge tomorrow if blood counts stable   Lab Results  Component Value Date   PLT 120 (L) 08/25/2017    Anti-infectives    Start     Dose/Rate Route Frequency Ordered Stop   08/25/17 1400  cephALEXin (KEFLEX) capsule 500 mg     500 mg Oral 4 times daily 08/25/17 1301      08/18/17 2200  acyclovir (ZOVIRAX) tablet 400 mg     400 mg Oral 2 times daily 08/18/17 1601     08/18/17 1800  ceFAZolin (ANCEF) IVPB 2g/100 mL premix     2 g 200 mL/hr over 30 Minutes Intravenous Every 6 hours 08/18/17 1201 08/19/17 0107   08/18/17 0845  vancomycin (VANCOCIN) IVPB 1000 mg/200 mL premix     1,000 mg 200 mL/hr over 60 Minutes Intravenous On call to O.R. 08/15/17 1034 08/18/17 0906        Objective:   Vitals:   08/24/17 2051 08/25/17 0444 08/25/17 0448 08/25/17 0934  BP: 127/69 (!) 107/40 (!) 99/49 (!) 137/53  Pulse: 90 86  85  Resp: 16 17    Temp: 98.4 F (36.9 C) 99.5 F (37.5 C)  100.3 F (37.9 C)  TempSrc: Oral Oral  Oral  SpO2: 92% 94%    Weight:      Height:        Wt Readings from Last 3 Encounters:  08/18/17 84.4 kg (186 lb)  08/08/17 84.7 kg (186 lb 11.2 oz)     Intake/Output Summary (Last 24 hours) at 08/25/17 1354 Last data filed at 08/25/17 0900  Gross per 24 hour  Intake              220 ml  Output                0 ml  Net              220  ml     Physical Exam  Awake Alert, Oriented X 3, No new F.N deficits, Normal affect [place].AT,PERRAL Supple Neck,No JVD, No cervical lymphadenopathy appriciated.  Symmetrical Chest wall movement, Good air movement bilaterally, CTAB RRR,No Gallops,Rubs or new Murmurs, No Parasternal Heave +ve B.Sounds, Abd Soft, No tenderness,minimal splenomegaly  appriciated, No rebound - guarding or rigidity.  No Cyanosis, Clubbing or edema, No new Rash or bruise      Data Review:    CBC  Recent Labs Lab 08/22/17 0628 08/22/17 1916 08/23/17 0634 08/24/17 0625 08/25/17 1018  WBC 2.5* 2.6* 2.7* 2.3* 2.7*  HGB 8.8* 10.0* 10.1* 9.2* 9.9*  HCT 27.9* 30.6* 31.3* 29.1* 31.7*  PLT 94* 99* 102* 109* 120*  MCV 76.9* 77.1* 77.9* 78.4 78.5  MCH 24.2* 25.2* 25.1* 24.8* 24.5*  MCHC 31.5 32.7 32.3 31.6 31.2  RDW 15.3 15.9* 15.7* 16.1* 15.9*  LYMPHSABS 0.5* 0.5* 0.4* 0.3* 0.4*  MONOABS 0.2 0.2 0.2 0.2 0.2    EOSABS 0.0 0.0 0.0 0.0 0.0  BASOSABS 0.0 0.0 0.0 0.0 0.0    Chemistries   Recent Labs Lab 08/21/17 0501 08/22/17 0628 08/23/17 0634 08/24/17 0625 08/25/17 1018  NA 140 138 136 136 134*  K 3.9 4.0 3.7 4.8 4.4  CL 103 98* 96* 100* 97*  CO2 28 32 32 30 28  GLUCOSE 133* 174* 189* 207* 240*  BUN 7 7 8 9 7   CREATININE 0.72 0.73 0.73 0.80 0.71  CALCIUM 8.2* 8.6* 8.6* 8.3* 9.0  AST  --  29 28 37 27  ALT  --  27 29 34 30  ALKPHOS  --  84 93 103 107  BILITOT  --  1.2 1.8* 1.1 1.5*   ------------------------------------------------------------------------------------------------------------------ No results for input(s): CHOL, HDL, LDLCALC, TRIG, CHOLHDL, LDLDIRECT in the last 72 hours.  Lab Results  Component Value Date   HGBA1C 6.1 (H) 08/08/2017   ------------------------------------------------------------------------------------------------------------------ No results for input(s): TSH, T4TOTAL, T3FREE, THYROIDAB in the last 72 hours.  Invalid input(s): FREET3 ------------------------------------------------------------------------------------------------------------------ No results for input(s): VITAMINB12, FOLATE, FERRITIN, TIBC, IRON, RETICCTPCT in the last 72 hours.  Coagulation profile No results for input(s): INR, PROTIME in the last 168 hours.  No results for input(s): DDIMER in the last 72 hours.  Cardiac Enzymes No results for input(s): CKMB, TROPONINI, MYOGLOBIN in the last 168 hours.  Invalid input(s): CK ------------------------------------------------------------------------------------------------------------------ No results found for: BNP  Inpatient Medications  Scheduled Meds: . acyclovir  400 mg Oral BID  . atorvastatin  20 mg Oral Daily  . calcium carbonate  1 tablet Oral Q breakfast  . cephALEXin  500 mg Oral QID  . docusate sodium  100 mg Oral BID  . insulin aspart  0-20 Units Subcutaneous TID WC  . insulin aspart  0-5 Units Subcutaneous  QHS  . insulin aspart  6 Units Subcutaneous TID WC  . insulin glargine  30 Units Subcutaneous QHS  . levothyroxine  100 mcg Oral QAC breakfast  . lisinopril  5 mg Oral Daily  . loratadine  10 mg Oral Daily  . losartan  25 mg Oral Daily  . pantoprazole  40 mg Oral BID  . polyethylene glycol  17 g Oral BID  . sertraline  100 mg Oral BID   Continuous Infusions:  PRN Meds:.acetaminophen **OR** acetaminophen, alum & mag hydroxide-simeth, diphenhydrAMINE, HYDROmorphone (DILAUDID) injection, menthol-cetylpyridinium **OR** phenol, metoCLOPramide **OR** metoCLOPramide (REGLAN) injection, ondansetron **OR** ondansetron (ZOFRAN) IV, oxyCODONE  Micro Results Recent Results (from the past 240 hour(s))  Culture, Urine  Status: Abnormal   Collection Time: 08/20/17  8:42 PM  Result Value Ref Range Status   Specimen Description URINE, CLEAN CATCH  Final   Special Requests NONE  Final   Culture <10,000 COLONIES/mL INSIGNIFICANT GROWTH (A)  Final   Report Status 08/22/2017 FINAL  Final    Radiology Reports Dg Chest 2 View  Result Date: 08/20/2017 CLINICAL DATA:  Fever EXAM: CHEST  2 VIEW COMPARISON:  None. FINDINGS: The heart size and mediastinal contours are within normal limits. Both lungs are clear. The visualized skeletal structures are unremarkable. IMPRESSION: No active cardiopulmonary disease. Electronically Signed   By: Franchot Gallo M.D.   On: 08/20/2017 18:45   Ct Angio Chest Pe W Or Wo Contrast  Result Date: 08/22/2017 CLINICAL DATA:  Fever and shortness of breath EXAM: CT ANGIOGRAPHY CHEST WITH CONTRAST TECHNIQUE: Multidetector CT imaging of the chest was performed using the standard protocol during bolus administration of intravenous contrast. Multiplanar CT image reconstructions and MIPs were obtained to evaluate the vascular anatomy. CONTRAST:  100 mL Isovue 370 nonionic COMPARISON:  August 20, 2017 FINDINGS: Cardiovascular: There is no demonstrable pulmonary embolus. There is no  thoracic aortic aneurysm or dissection. There is atherosclerotic calcification in the proximal left subclavian artery. Visualized great vessels otherwise appear unremarkable. There is atherosclerotic calcification in the aorta as well as foci of coronary artery calcification. Pericardium is not appreciably thickened. Mediastinum/Nodes: Thyroid is not visualized. There are subcentimeter mediastinal lymph nodes without adenopathy by size criteria. No esophageal lesions are evident. Lungs/Pleura: There is no edema or consolidation. No pleural effusion or pleural thickening evident. Upper Abdomen: Spleen is incompletely visualized but noted to be enlarged. From anterior to posterior dimension, spleen measures 18.7 cm in length. Visualized upper abdominal structures otherwise appear unremarkable. Musculoskeletal: There is degenerative change in the thoracic spine with a degree of diffuse idiopathic skeletal hyperostosis. There are no evident blastic or lytic bone lesions. Review of the MIP images confirms the above findings. IMPRESSION: 1.  No demonstrable pulmonary embolus. 2.  Splenomegaly, incompletely visualized. 3.  No lung edema or consolidation. 4.  No evident adenopathy. 5. Aortic atherosclerosis. Great vessel calcification as well as foci of coronary artery calcification. Aortic Atherosclerosis (ICD10-I70.0). Electronically Signed   By: Lowella Grip III M.D.   On: 08/22/2017 14:06   US Abdomen Complete  Result Date: 08/24/2017 CLINICAL DATA:  Hepatosplenomegaly. EXAM: ABDOMEN ULTRASOUND COMPLETE COMPARISON:  CT 08/22/2017 of the chest. FINDINGS: Gallbladder: Nonobstructing layering calculi are noted within a physiologically distended gallbladder. No wall thickening. Single wall thickness is 1.6 mm. The largest stone is approximately 8 mm. No sonographic Murphy sign noted by sonographer. Common bile duct: Diameter: Normal at 4.5 mm Liver: No focal lesion identified. Increased parenchymal echogenicity.  Portal vein is patent on color Doppler imaging with normal direction of blood flow towards the liver. IVC: No abnormality visualized. Pancreas: Visualized portion unremarkable. Spleen: Enlarged spleen measuring 19.8 x 20.9 x 11.3 cm (volume = 2450 cm^3). No space-occupying mass seen. Right Kidney: Length: 11.2 cm. Echogenicity within normal limits. No mass or hydronephrosis visualized. Left Kidney: Length: 10.9 cm. Echogenicity within normal limits. No mass or hydronephrosis visualized. Abdominal aorta: No aneurysm visualized. The distal aorta and bifurcation were obscured by overlying bowel. Maximum caliber 2.4 cm of the aorta. Other findings: None. IMPRESSION: 1. Splenomegaly with the spleen measuring 19.8 x 20.9 x 11.3 cm. No focal mass. 2. Increased echogenicity of the liver consistent with fatty infiltration. No discrete mass. No definite hepatomegaly. 3.  Uncomplicated cholelithiasis. Electronically Signed   By: Ashley Royalty M.D.   On: 08/24/2017 23:09    Time Spent in minutes  52   Adileny Delon D M.D on 08/25/2017 at 1:54 PM  Between 7am to 7pm - Pager - [phone]  After 7pm go to [email or web address] - password [facility]  [facility] -  Office  [phone]

## 2017-08-25 NOTE — Progress Notes (Signed)
Physical Therapy Treatment Patient Details Name: Katherine Bell MRN: [id number] DOB: [birth year] Today's Date: 08/25/2017    History of Present Illness Pt is a 66 y/o female s/p elective L TKA. PMH includes HTN, DM, OSA, and L corneal ulcer.     PT Comments    Pt performed increased gait during session.  Verbal review of stair training.  PTA re-issued HEP with emphasis on technique and knee flexion.  Plan this pm for continued gait training and strengthening exercises.     Follow Up Recommendations  DC plan and follow up therapy as arranged by surgeon;Supervision for mobility/OOB     Equipment Recommendations  None recommended by PT    Recommendations for Other Services       Precautions / Restrictions Precautions Precautions: Knee Precaution Booklet Issued: Yes (comment) Precaution Comments: Reviewed resting with knee in extension Required Braces or Orthoses: Knee Immobilizer - Left Knee Immobilizer - Left:  (until discontinued) Restrictions Weight Bearing Restrictions: Yes LLE Weight Bearing: Weight bearing as tolerated    Mobility  Bed Mobility Overal bed mobility: Needs Assistance Bed Mobility: Supine to Sit     Supine to sit: Supervision     General bed mobility comments: Min A to elevate LLE due tp significant pain  Transfers Overall transfer level: Needs assistance Equipment used: Rolling walker (2 wheeled) Transfers: Sit to/from Stand Sit to Stand: Min guard Stand pivot transfers: Min guard       General transfer comment: Cues for safety and to keep LLE flexed when returning to seated position.    Ambulation/Gait Ambulation/Gait assistance: Min guard Ambulation Distance (Feet): 730 Feet Assistive device: Rolling walker (2 wheeled) Gait Pattern/deviations: Step-through pattern;Decreased stride length;Shuffle;Trunk flexed;Antalgic Gait velocity: Decreased Gait velocity interpretation: Below normal speed for age/gender General Gait Details: Pt  performed increased activity during session this am.  Pt remains to require cues for gait symmetry.  Pt motivated to progress.     Stairs Stairs:  (Pt reviewed stair training verbally.  )          Wheelchair Mobility    Modified Rankin (Stroke Patients Only)       Balance Overall balance assessment: Needs assistance   Sitting balance-Leahy Scale: Good       Standing balance-Leahy Scale: Fair Standing balance comment: reliant on UE support on RW during mobility                             Cognition Arousal/Alertness: Awake/alert Behavior During Therapy: WFL for tasks assessed/performed Overall Cognitive Status: Within Functional Limits for tasks assessed                                        Exercises Total Joint Exercises Ankle Circles/Pumps: AROM;Both;10 reps Quad Sets: AROM;Left;10 reps;Supine Towel Squeeze: AROM;Left;10 reps;Supine Short Arc Quad: AROM;Left;10 reps;Supine Heel Slides: AROM;Left;10 reps;Supine Hip ABduction/ADduction: AROM;Left;10 reps;Supine Straight Leg Raises: AAROM;Left;10 reps;Supine Long Arc Quad: AROM;Left;10 reps;Supine Knee Flexion: AROM;Left;10 reps;Seated;AAROM (1x10 AROM and 1x10 AAROM) Goniometric ROM: 83 degrees flexion in sitting.      General Comments        Pertinent Vitals/Pain Pain Assessment: 0-10 Pain Score: 8  Pain Location: posterior L knee Pain Descriptors / Indicators: Aching;Grimacing;Guarding Pain Intervention(s): Monitored during session;Repositioned;Ice applied    Home Living  Prior Function            PT Goals (current goals can now be found in the care plan section) Acute Rehab PT Goals Patient Stated Goal: to go home Potential to Achieve Goals: Good Progress towards PT goals: Progressing toward goals    Frequency    7X/week      PT Plan Current plan remains appropriate    Co-evaluation              AM-PAC PT "6 Clicks"  Daily Activity  Outcome Measure  Difficulty turning over in bed (including adjusting bedclothes, sheets and blankets)?: A Little Difficulty moving from lying on back to sitting on the side of the bed? : Unable Difficulty sitting down on and standing up from a chair with arms (e.g., wheelchair, bedside commode, etc,.)?: Unable Help needed moving to and from a bed to chair (including a wheelchair)?: A Little Help needed walking in hospital room?: A Little Help needed climbing 3-5 steps with a railing? : A Little 6 Click Score: 14    End of Session Equipment Utilized During Treatment: Gait belt Activity Tolerance: Patient tolerated treatment well Patient left: with call bell/phone within reach;in chair;with family/visitor present;with nursing/sitter in room (PA in room. ) Nurse Communication: Mobility status PT Visit Diagnosis: Other abnormalities of gait and mobility (R26.89);Pain Pain - Right/Left: Left Pain - part of body: Knee     Time: 1132-1208 PT Time Calculation (min) (ACUTE ONLY): 36 min  Charges:  $Gait Training: 8-22 mins $Therapeutic Exercise: 8-22 mins                    G Codes:       Governor Rooks, PTA pager [phone]    Cristela Blue 08/25/2017, 12:24 PM

## 2017-08-25 NOTE — Progress Notes (Signed)
Subjective: 7 Days Post-Op Procedure(s) (LRB): LEFT TOTAL KNEE ARTHROPLASTY (Left) Patient reports pain as 7 on 0-10 scale.    Objective: Vital signs in last 24 hours: Temp:  [98.4 F (36.9 C)-100.3 F (37.9 C)] 100.3 F (37.9 C) (10/22 0934) Pulse Rate:  [85-96] 85 (10/22 0934) Resp:  [16-18] 17 (10/22 0444) BP: (99-137)/(40-69) 137/53 (10/22 0934) SpO2:  [92 %-95 %] 94 % (10/22 0444)  Intake/Output from previous day: 10/21 0701 - 10/22 0700 In: 240 [P.O.:240] Out: 275 [Urine:275] Intake/Output this shift: Total I/O In: 220 [P.O.:220] Out: -    Recent Labs  08/22/17 1916 08/23/17 0634 08/24/17 0625 08/25/17 1018  HGB 10.0* 10.1* 9.2* 9.9*    Recent Labs  08/24/17 0625 08/25/17 1018  WBC 2.3* 2.7*  RBC 3.71* 4.04  HCT 29.1* 31.7*  PLT 109* 120*    Recent Labs  08/24/17 0625 08/25/17 1018  NA 136 134*  K 4.8 4.4  CL 100* 97*  CO2 30 28  BUN 9 7  CREATININE 0.80 0.71  GLUCOSE 207* 240*  CALCIUM 8.3* 9.0   No results for input(s): LABPT, INR in the last 72 hours.  Neurologically intact ABD soft Sensation intact distally Intact pulses distally Dorsiflexion/Plantar flexion intact Incision: scant drainage  Hematoma where the tourniquet was is somewhat more cherry red today.  May be early cellulitis will treat with Keflex in the setting of Leukopenia and Diabetes  Assessment/Plan: 7 Days Post-Op Procedure(s) (LRB): LEFT TOTAL KNEE ARTHROPLASTY (Left)  Principal Problem:   Primary localized osteoarthrosis of the knee, left Active Problems:   Sleep apnea, obstructive   Osteoarthritis of left knee   Hypothyroidism   Hypertension   Hypercholesteremia   GERD (gastroesophageal reflux disease)   Diabetes mellitus without complication (HCC)   Corneal ulcer, left   Anxiety   Acute blood loss as cause of postoperative anemia   Anemia of chronic disease   Splenomegaly   Thrombocytosis (HCC)   Thrombocytopenia (HCC)  I spoke to Dr Earlie Server of  Hematology today regarding this patient's pancytopenia and low grade fever.  He did not feel that a hematologic work up needed to be done as an inpatient but rather it should be done as an outpatient.  I have left a message with the new patient scheduler at the cancer center to get this appointment scheduled.  Will add Keflex to this patients meds to treat what may be early cellulitis where the tourniquet was.  Patient is having some constipation where she feels she is leaking stool around constipation.  Ordered a fleets enema for this.  Discussed the importance of Moving since she is not on any DVT Prophylaxis to to thrombocytopenia. Advance diet Up with therapy Plan for discharge tomorrow Discharge home with home health  Linda Hedges 08/25/2017, 1:08 PM

## 2017-08-25 NOTE — Progress Notes (Addendum)
Physical Therapy Treatment Patient Details Name: Katherine Bell MRN: [id number] DOB: [birth year] Today's Date: 08/25/2017    History of Present Illness Pt is a 66 y/o female s/p elective L TKA. PMH includes HTN, DM, OSA, and L corneal ulcer.     PT Comments     Pt is making progress towards all goals. Pt was modified Ind. during all bed mobility and min guard with all other functional activities. Continue to work on gait and strengthening in LLE next session.   Follow Up Recommendations  DC plan and follow up therapy as arranged by surgeon;Supervision for mobility/OOB     Equipment Recommendations  None recommended by PT    Recommendations for Other Services       Precautions / Restrictions Precautions Precautions: Knee Precaution Comments: Reviewed proper positioning for L knee when laying in bed. Required Braces or Orthoses: Knee Immobilizer - Left Knee Immobilizer - Left:  (until discontinued) Restrictions Weight Bearing Restrictions: Yes LLE Weight Bearing: Weight bearing as tolerated    Mobility  Bed Mobility Overal bed mobility: Modified Independent       Supine to sit: Modified independent (Device/Increase time) Sit to supine: Modified independent (Device/Increase time)   General bed mobility comments: Pt able to perform supine to sit and sit to supine without cues  Transfers Overall transfer level: Needs assistance Equipment used: Rolling walker (2 wheeled) Transfers: Sit to/from Stand Sit to Stand: Min guard         General transfer comment: Pt demonstrates correct hand placement on walker during standing. Pt required min guard for safety as pt presented unsteady in standing.   Ambulation/Gait Ambulation/Gait assistance: Min guard Ambulation Distance (Feet): 730 Feet Assistive device: Rolling walker (2 wheeled) Gait Pattern/deviations: Step-through pattern;Decreased stride length;Shuffle;Trunk flexed;Antalgic;Drifts right/left     General Gait  Details: Pt performed increased activity during session this afternoon.  Pt remains to require cues for gait symmetry. Pt required vc to correct driftting to the left with walker.  Pt motivated to progress.     Stairs     Stair Management: No rails;With walker (Curb) Number of Stairs: 4 (curb) General stair comments: Pt. required vc for proper sequence and technique for gait with curbs.   Wheelchair Mobility    Modified Rankin (Stroke Patients Only)       Balance Overall balance assessment: Needs assistance   Sitting balance-Leahy Scale: Good       Standing balance-Leahy Scale: Good Standing balance comment: reliant on UE support on RW during mobility                             Cognition Arousal/Alertness: Awake/alert Behavior During Therapy: WFL for tasks assessed/performed Overall Cognitive Status: Within Functional Limits for tasks assessed                                        Exercises Total Joint Exercises Ankle Circles/Pumps: AROM;Both;10 reps Quad Sets: AROM;Left;10 reps;Supine Towel Squeeze: AROM;Left;10 reps;Supine Short Arc Quad: AROM;Left;10 reps;Supine Heel Slides: AROM;Left;10 reps;Seated Hip ABduction/ADduction: AROM;Left;10 reps;Supine Straight Leg Raises: AAROM;Left;10 reps;Supine Long Arc Quad: AROM;Left;10 reps;Supine    General Comments        Pertinent Vitals/Pain Pain Assessment: Faces Faces Pain Scale: Hurts little more Pain Descriptors / Indicators: Sore Pain Intervention(s): Repositioned;Monitored during session;Ice applied    Home Living  Prior Function            PT Goals (current goals can now be found in the care plan section) Acute Rehab PT Goals Patient Stated Goal: to go home Potential to Achieve Goals: Good Progress towards PT goals: Progressing toward goals    Frequency    7X/week      PT Plan Current plan remains appropriate    Co-evaluation               AM-PAC PT "6 Clicks" Daily Activity  Outcome Measure  Difficulty turning over in bed (including adjusting bedclothes, sheets and blankets)?: A Little Difficulty moving from lying on back to sitting on the side of the bed? : A Little Difficulty sitting down on and standing up from a chair with arms (e.g., wheelchair, bedside commode, etc,.)?: A Little Help needed moving to and from a bed to chair (including a wheelchair)?: A Little Help needed walking in hospital room?: A Little Help needed climbing 3-5 steps with a railing? : A Little 6 Click Score: 18    End of Session Equipment Utilized During Treatment: Gait belt Activity Tolerance: Patient tolerated treatment well Patient left: in bed;in CPM;with family/visitor present;with call bell/phone within reach   PT Visit Diagnosis: Other abnormalities of gait and mobility (R26.89);Pain Pain - Right/Left: Left Pain - part of body: Knee     Time: 8270-7867 PT Time Calculation (min) (ACUTE ONLY): 38 min  Charges:  $Gait Training: 8-22 mins $Therapeutic Exercise: 8-22 mins $Therapeutic Activity: 8-22 mins                    G Codes:       Vaishnavi Dalby,SPTA   Fransisca Connors 08/25/2017, 5:19 PM

## 2017-08-26 ENCOUNTER — Inpatient Hospital Stay (HOSPITAL_COMMUNITY): Payer: Medicare Other

## 2017-08-26 DIAGNOSIS — M7989 Other specified soft tissue disorders: Secondary | ICD-10-CM

## 2017-08-26 LAB — COMPREHENSIVE METABOLIC PANEL
ALBUMIN: 3.2 g/dL — AB (ref 3.5–5.0)
ALT: 23 U/L (ref 14–54)
AST: 20 U/L (ref 15–41)
Alkaline Phosphatase: 107 U/L (ref 38–126)
Anion gap: 9 (ref 5–15)
BUN: 6 mg/dL (ref 6–20)
CHLORIDE: 97 mmol/L — AB (ref 101–111)
CO2: 29 mmol/L (ref 22–32)
CREATININE: 0.76 mg/dL (ref 0.44–1.00)
Calcium: 8.9 mg/dL (ref 8.9–10.3)
GFR calc Af Amer: >60 mL/min (ref >60)
GLUCOSE: 258 mg/dL — AB (ref 65–99)
POTASSIUM: 4 mmol/L (ref 3.5–5.1)
Sodium: 135 mmol/L (ref 135–145)
Total Bilirubin: 1.6 mg/dL — ABNORMAL HIGH (ref 0.3–1.2)
Total Protein: 5.8 g/dL — ABNORMAL LOW (ref 6.5–8.1)

## 2017-08-26 LAB — CBC WITH DIFFERENTIAL/PLATELET
BASOS ABS: 0 10*3/uL (ref 0.0–0.1)
Basophils Relative: 0 %
EOS ABS: 0 10*3/uL (ref 0.0–0.7)
EOS PCT: 0 %
HCT: 28 % — ABNORMAL LOW (ref 36.0–46.0)
HEMOGLOBIN: 9 g/dL — AB (ref 12.0–15.0)
LYMPHS ABS: 0.3 10*3/uL — AB (ref 0.7–4.0)
LYMPHS PCT: 12 %
MCH: 24.9 pg — AB (ref 26.0–34.0)
MCHC: 32.1 g/dL (ref 30.0–36.0)
MCV: 77.6 fL — ABNORMAL LOW (ref 78.0–100.0)
Monocytes Absolute: 0.2 10*3/uL (ref 0.1–1.0)
Monocytes Relative: 7 %
Neutro Abs: 2 10*3/uL (ref 1.7–7.7)
Neutrophils Relative %: 80 %
PLATELETS: 111 10*3/uL — AB (ref 150–400)
RBC: 3.61 MIL/uL — AB (ref 3.87–5.11)
RDW: 15.9 % — ABNORMAL HIGH (ref 11.5–15.5)
WBC: 2.5 10*3/uL — AB (ref 4.0–10.5)

## 2017-08-26 LAB — URINE CULTURE: Culture: <10000 — AB

## 2017-08-26 LAB — GLUCOSE, CAPILLARY
GLUCOSE-CAPILLARY: 177 mg/dL — AB (ref 65–99)
Glucose-Capillary: 274 mg/dL — ABNORMAL HIGH (ref 65–99)

## 2017-08-26 MED ORDER — CEPHALEXIN 500 MG PO CAPS: 500.0000 mg | ORAL_CAPSULE | Freq: Four times a day (QID) | ORAL | 0 refills | Status: DC

## 2017-08-26 NOTE — Progress Notes (Signed)
Orthopedic Tech Progress Note Patient Details:  Katherine Bell May 31, 1951 [id number]  CPM Left Knee CPM Left Knee: Off Left Knee Flexion (Degrees): 85 Left Knee Extension (Degrees): -2 Additional Comments: Pt is using Bone foam at this time.   Kristopher Oppenheim 08/26/2017, 1:40 PM

## 2017-08-26 NOTE — Progress Notes (Addendum)
Pt's daughter called RN and states that no prescriptions were sent to the pt's pharmacy. RN left voicemail for PA and left a message with the after 5pm number.

## 2017-08-26 NOTE — Progress Notes (Signed)
Physical Therapy Treatment Patient Details Name: Katherine Bell MRN: [id number] DOB: 02-Oct-1951 Today's Date: 08/26/2017    History of Present Illness Pt is a 66 y/o female s/p elective L TKA. PMH includes HTN, DM, OSA, and L corneal ulcer.     PT Comments    Daughter requesting education on [facility] that ortho PA suggested.  Pt performed short gait trial with SPC.  Pt unsteady with cane and encouraged to continue gait training with RW until balance and strength improves.  PTA educated patient and spouse on technique to negotiate tight spaces with RW.  Plan for continued gait training and strengthening next session.    Follow Up Recommendations  DC plan and follow up therapy as arranged by surgeon;Supervision for mobility/OOB     Equipment Recommendations  None recommended by PT    Recommendations for Other Services       Precautions / Restrictions Precautions Precautions: Knee Precaution Booklet Issued: Yes (comment) Precaution Comments: Reviewed proper positioning for L knee when laying in bed. Required Braces or Orthoses: Knee Immobilizer - Left Restrictions Weight Bearing Restrictions: Yes LLE Weight Bearing: Weight bearing as tolerated    Mobility  Bed Mobility   General bed mobility comments: Pt sitting on edge of bed on arrival.    Transfers Equipment used: SPC Transfers: Sit to/from Stand Sit to Stand: Min assist Stand pivot transfers: Min assist       General transfer comment: Pt performed with min assist as she required increased assistance to balance and ascend into standing with cane.    Ambulation/Gait Ambulation/Gait assistance: Min assist Ambulation Distance (Feet): 40 Feet Assistive device: Straight cane Gait Pattern/deviations: Decreased stride length;Shuffle;Trunk flexed;Antalgic;Step-to pattern Gait velocity: Decreased Gait velocity interpretation: Below normal speed for age/gender General Gait Details: Pt with LOB and difficulty sequencing cane for  short gait trial.  Encouraged patient to use RW until balance and strength improves.     Stairs      Wheelchair Mobility    Modified Rankin (Stroke Patients Only)       Balance Overall balance assessment: Needs assistance   Sitting balance-Leahy Scale: Good       Standing balance-Leahy Scale: Good                              Cognition Arousal/Alertness: Awake/alert Behavior During Therapy: WFL for tasks assessed/performed Overall Cognitive Status: Within Functional Limits for tasks assessed                                        Exercises    General Comments        Pertinent Vitals/Pain Pain Assessment: 0-10 Pain Score: 6  Pain Location: posterior L knee Pain Descriptors / Indicators: Sore;Tightness Pain Intervention(s): Monitored during session;Repositioned;Ice applied    Home Living                      Prior Function            PT Goals (current goals can now be found in the care plan section) Acute Rehab PT Goals Patient Stated Goal: to go home Potential to Achieve Goals: Good Progress towards PT goals: Progressing toward goals    Frequency    7X/week      PT Plan Current plan remains appropriate    Co-evaluation  AM-PAC PT "6 Clicks" Daily Activity  Outcome Measure  Difficulty turning over in bed (including adjusting bedclothes, sheets and blankets)?: A Little Difficulty moving from lying on back to sitting on the side of the bed? : A Little Difficulty sitting down on and standing up from a chair with arms (e.g., wheelchair, bedside commode, etc,.)?: A Little Help needed moving to and from a bed to chair (including a wheelchair)?: A Little Help needed walking in hospital room?: A Little Help needed climbing 3-5 steps with a railing? : A Little 6 Click Score: 18    End of Session Equipment Utilized During Treatment: Gait belt Activity Tolerance: Patient tolerated treatment  well Patient left: in bed;with family/visitor present Nurse Communication: Mobility status PT Visit Diagnosis: Pain Pain - Right/Left: Left Pain - part of body: Knee     Time: 1012-1020 PT Time Calculation (min) (ACUTE ONLY): 8 min  Charges:  $Gait Training: 8-22 mins                    G Codes:       Governor Rooks, PTA pager [place] 08/26/2017, 4:47 PM

## 2017-08-26 NOTE — Progress Notes (Signed)
PA states she called the pharmacy and the pharmacist stated she has the prescriptions now. RN notified pt.

## 2017-08-26 NOTE — Progress Notes (Signed)
Physical Therapy Treatment Patient Details Name: Katherine Bell MRN: [id number] DOB: 01-Aug-1951 Today's Date: 08/26/2017    History of Present Illness Pt is a 66 y/o female s/p elective L TKA. PMH includes HTN, DM, OSA, and L corneal ulcer.     PT Comments     Pt completed all activities this afternoon. Pt was anxious and initially not willing to perform PT due to doppler test that she has been waiting on this afternoon. Pt ambulated with min guard. VC needed frequently to remind pt to stop walking when scratching nose. Went over stairs;no rails;with walker, with pt and family member. Pt was able to correct proper technique for sit to stand transfer on her own after initial cue for proper hand placement.   Follow Up Recommendations  DC plan and follow up therapy as arranged by surgeon;Supervision for mobility/OOB     Equipment Recommendations  None recommended by PT    Recommendations for Other Services       Precautions / Restrictions Precautions Precautions: Knee Precaution Comments: Reviewed proper positioning for L knee when laying in bed. Required Braces or Orthoses: Knee Immobilizer - Left Restrictions Weight Bearing Restrictions: Yes LLE Weight Bearing: Weight bearing as tolerated    Mobility  Bed Mobility Overal bed mobility: Modified Independent Bed Mobility: Supine to Sit     Supine to sit: Modified independent (Device/Increase time) Sit to supine: Modified independent (Device/Increase time)   General bed mobility comments: Pt able to perform supine to sit and sit to supine without cues  Transfers Overall transfer level: Modified independent Equipment used: Rolling walker (2 wheeled) Transfers: Sit to/from Stand Sit to Stand: Min guard         General transfer comment: Pt. required vc for proper hand placement during sit to stand from bed. Demonstrated understanding after initial cue. Pt was able to complete proper toilet transfer. No vc needed.    Ambulation/Gait Ambulation/Gait assistance: Min guard Ambulation Distance (Feet): 730 Feet Assistive device: Rolling walker (2 wheeled) Gait Pattern/deviations: Step-through pattern;Decreased stride length;Shuffle;Trunk flexed;Antalgic     General Gait Details: Pt demonstrated increased step length, better gait symmetry with no VC needed. VC needed to stop walking when taking hand off walker to scratch nose.   Stairs     Stair Management: No rails;With walker Number of Stairs: 4 General stair comments: Pt. required vc for proper sequence and technique during stairs. Intially required min-assist, progressed to min-guard.  Wheelchair Mobility    Modified Rankin (Stroke Patients Only)       Balance Overall balance assessment: Needs assistance   Sitting balance-Leahy Scale: Good       Standing balance-Leahy Scale: Good Standing balance comment: reliant on UE support on RW during mobility                             Cognition Arousal/Alertness: Awake/alert Behavior During Therapy: WFL for tasks assessed/performed Overall Cognitive Status: Within Functional Limits for tasks assessed                                        Exercises Total Joint Exercises Ankle Circles/Pumps: AROM;Both;20 reps;Supine Quad Sets: AROM;Left;10 reps;Supine Towel Squeeze: AROM;Left;10 reps;Supine Short Arc Quad: AROM;Left;10 reps;Supine Heel Slides: AROM;Left;10 reps;Seated Hip ABduction/ADduction: AROM;Left;10 reps;Supine Straight Leg Raises: Left;10 reps;Supine;AROM Long Arc Quad: AROM;Left;10 reps;Supine Knee Flexion: Left;10 reps;Seated;AAROM  General Comments        Pertinent Vitals/Pain Pain Assessment: 0-10 Pain Score: 6  Pain Location: posterior L knee Pain Descriptors / Indicators: Sore;Tightness Pain Intervention(s): Monitored during session;Repositioned;Ice applied    Home Living                      Prior Function             PT Goals (current goals can now be found in the care plan section) Acute Rehab PT Goals Patient Stated Goal: to go home Potential to Achieve Goals: Good Progress towards PT goals: Progressing toward goals    Frequency    7X/week      PT Plan Current plan remains appropriate    Co-evaluation              AM-PAC PT "6 Clicks" Daily Activity  Outcome Measure  Difficulty turning over in bed (including adjusting bedclothes, sheets and blankets)?: A Little Difficulty moving from lying on back to sitting on the side of the bed? : A Little Difficulty sitting down on and standing up from a chair with arms (e.g., wheelchair, bedside commode, etc,.)?: A Little Help needed moving to and from a bed to chair (including a wheelchair)?: A Little Help needed walking in hospital room?: A Little Help needed climbing 3-5 steps with a railing? : A Little 6 Click Score: 18    End of Session Equipment Utilized During Treatment: Gait belt Activity Tolerance: Patient tolerated treatment well Patient left: in bed;with family/visitor present Nurse Communication: Mobility status PT Visit Diagnosis: Pain Pain - Right/Left: Left Pain - part of body: Knee     Time: 1335-1411 PT Time Calculation (min) (ACUTE ONLY): 36 min  Charges:  $Gait Training: 8-22 mins $Therapeutic Exercise: 8-22 mins                    G Codes:       Fransisca Connors, [place] 08/26/2017, 2:54 PM

## 2017-08-26 NOTE — Progress Notes (Signed)
Subjective: 8 Days Post-Op Procedure(s) (LRB): LEFT TOTAL KNEE ARTHROPLASTY (Left) Patient reports pain as 7 on 0-10 scale.    Objective: Vital signs in last 24 hours: Temp:  [99.5 F (37.5 C)-100.3 F (37.9 C)] 99.9 F (37.7 C) (10/23 0505) Pulse Rate:  [85-95] 90 (10/23 0505) Resp:  [16] 16 (10/23 0505) BP: (114-137)/(50-78) 131/68 (10/23 0505) SpO2:  [96 %-98 %] 96 % (10/23 0505)  Intake/Output from previous day: 10/22 0701 - 10/23 0700 In: 700 [P.O.:700] Out: -  Intake/Output this shift: No intake/output data recorded.   Recent Labs  08/24/17 0625 08/25/17 1018  HGB 9.2* 9.9*    Recent Labs  08/24/17 0625 08/25/17 1018  WBC 2.3* 2.7*  RBC 3.71* 4.04  HCT 29.1* 31.7*  PLT 109* 120*    Recent Labs  08/24/17 0625 08/25/17 1018  NA 136 134*  K 4.8 4.4  CL 100* 97*  CO2 30 28  BUN 9 7  CREATININE 0.80 0.71  GLUCOSE 207* 240*  CALCIUM 8.3* 9.0   No results for input(s): LABPT, INR in the last 72 hours.  ABD soft Neurovascular intact Sensation intact distally Intact pulses distally Dorsiflexion/Plantar flexion intact Incision: dressing C/D/I  Patient is having worsening calf pain and swelling in left operative leg. Will get venous duplex doppler to rule out DVT.  When I arrived at bedside today, patient was laying beside her CPM.  She had turned it off in a flexed position.    Assessment/Plan: 8 Days Post-Op Procedure(s) (LRB): LEFT TOTAL KNEE ARTHROPLASTY (Left)  Principal Problem:   Primary localized osteoarthrosis of the knee, left Active Problems:   Sleep apnea, obstructive   Osteoarthritis of left knee   Hypothyroidism   Hypertension   Hypercholesteremia   GERD (gastroesophageal reflux disease)   Diabetes mellitus without complication (HCC)   Corneal ulcer, left   Anxiety   Acute blood loss as cause of postoperative anemia   Anemia of chronic disease   Splenomegaly   Thrombocytosis (HCC)   Thrombocytopenia ([place]) Will get left lower  extremity doppler to rule out DVT    Labs are pending Discharge home with home health  Ashraf Mesta J 08/26/2017, 7:52 AM

## 2017-08-26 NOTE — Progress Notes (Signed)
Patient ID: Katherine Bell, female   DOB: [birth year], 66 y.o.   MRN: [id number]                                                                PROGRESS NOTE                                                                                                                                                                                                             Patient Demographics:    Katherine Bell, is a 66 y.o. female, DOB - 21-Jul-1951, IPJ:[id number]  Admit date - 08/18/2017   Admitting Physician Elsie Saas, MD  Outpatient Primary MD for the patient is Monico Blitz, MD  LOS - 8  Outpatient Specialists:     No chief complaint on file. Thrombocytopenia     Brief Narrative  66 y.o.femalewith medical history significant for osteoarthritis status post left total knee arthroplasty, thrombocytopenia, among others who is POD # 4 post left total knee arthroplasty. Medicine team was consulted due to incidentally found splenomegaly and persistent thrombocytopenia.Denies petechia or mucosal bleed.Was not aware of history of thrombocytopenia and denies family history of blood illness or GI cancer.Denies abdominal pain,nausea or vomiting, history of infectious mononucleosis, known viralinfection, or alcohol abuse.   Subjective:    Katherine Bell today has bleeding or petechiea.  Thrombocytopenia improving.    Mononucleosis negative  Pt does note that her leg is bothering her some but this is being addressed by orthopedics.    No headache, No chest pain, No abdominal pain - No Nausea, No new weakness tingling or numbness, No Cough - SOB.    Assessment  & Plan :    Principal Problem:   Primary localized osteoarthrosis of the knee, left Active Problems:   Sleep apnea, obstructive   Osteoarthritis of left knee   Hypothyroidism   Hypertension   Hypercholesteremia   GERD (gastroesophageal reflux disease)   Diabetes mellitus without complication (HCC)   Corneal ulcer, left   Anxiety   Acute  blood loss as cause of postoperative anemia   Anemia of chronic disease   Splenomegaly   Thrombocytosis (HCC)   Thrombocytopenia (HCC)   Assessment: 1.  Splenomegaly w/ hypersplenism (thrombocytopenia/ leukopenia/ anemia) - Spleen is significantly enlarged by CT and confirmed by [place].  No splenic  lesions/ masses seen.  Liver is normal, not cirrhotic.  Hep C, HIV and Mono screen were negative. Have made an appt with Dr OP hematology appt as OP, see DC instructions.  2.  Low-grade fevers - postop low grade fevers, started on po Keflex by surgery. 4.  Leukopenia - this is probably related as well to splenomegaly.  See plan as per #1.   5.  Herpes keratitis - pt take acyclovir for herpetic keratitis (corneal infection) 6.  L TKA - postop 7.  DM2 - stable 8.  HTN - stable   Plan - going home today.  Will sign off.   Kelly Splinter MD [facility] pgr [phone] 08/26/2017, 12:37 PM     Code Status : FULL CODE  Family Communication  : w patient  Disposition Plan  : discharge tomorrow if blood counts stable   Lab Results  Component Value Date   PLT 111 (L) 08/26/2017    Anti-infectives    Start     Dose/Rate Route Frequency Ordered Stop   08/26/17 0000  cephALEXin (KEFLEX) 500 MG capsule     500 mg Oral 4 times daily 08/26/17 0917     08/25/17 1400  cephALEXin (KEFLEX) capsule 500 mg     500 mg Oral 4 times daily 08/25/17 1301     08/18/17 2200  acyclovir (ZOVIRAX) tablet 400 mg     400 mg Oral 2 times daily 08/18/17 1601     08/18/17 1800  ceFAZolin (ANCEF) IVPB 2g/100 mL premix     2 g 200 mL/hr over 30 Minutes Intravenous Every 6 hours 08/18/17 1201 08/19/17 0107   08/18/17 0845  vancomycin (VANCOCIN) IVPB 1000 mg/200 mL premix     1,000 mg 200 mL/hr over 60 Minutes Intravenous On call to O.R. 08/15/17 1034 08/18/17 0906        Objective:   Vitals:   08/25/17 0934 08/25/17 1500 08/25/17 2026 08/26/17 0505  BP: (!) 137/53 (!) 117/50 114/78 131/68    Pulse: 85 95 86 90  Resp:  16 16 16   Temp: 100.3 F (37.9 C) 100.1 F (37.8 C) 99.5 F (37.5 C) 99.9 F (37.7 C)  TempSrc: Oral Oral Oral Oral  SpO2:  96% 98% 96%  Weight:      Height:        Wt Readings from Last 3 Encounters:  08/18/17 84.4 kg (186 lb)  08/08/17 84.7 kg (186 lb 11.2 oz)     Intake/Output Summary (Last 24 hours) at 08/26/17 1237 Last data filed at 08/26/17 0851  Gross per 24 hour  Intake              720 ml  Output                0 ml  Net              720 ml     Physical Exam  Awake Alert, Oriented X 3, No new F.N deficits, Normal affect [place].AT,PERRAL Supple Neck,No JVD, No cervical lymphadenopathy appriciated.  Symmetrical Chest wall movement, Good air movement bilaterally, CTAB RRR,No Gallops,Rubs or new Murmurs, No Parasternal Heave +ve B.Sounds, Abd Soft, No tenderness,minimal splenomegaly  appriciated, No rebound - guarding or rigidity.  No Cyanosis, Clubbing or edema, No new Rash or bruise      Data Review:    CBC  Recent Labs Lab 08/22/17 1916 08/23/17 0634 08/24/17 0625 08/25/17 1018 08/26/17 0614  WBC 2.6* 2.7* 2.3* 2.7* 2.5*  HGB 10.0* 10.1* 9.2* 9.9* 9.0*  HCT 30.6* 31.3* 29.1* 31.7* 28.0*  PLT 99* 102* 109* 120* 111*  MCV 77.1* 77.9* 78.4 78.5 77.6*  MCH 25.2* 25.1* 24.8* 24.5* 24.9*  MCHC 32.7 32.3 31.6 31.2 32.1  RDW 15.9* 15.7* 16.1* 15.9* 15.9*  LYMPHSABS 0.5* 0.4* 0.3* 0.4* 0.3*  MONOABS 0.2 0.2 0.2 0.2 0.2  EOSABS 0.0 0.0 0.0 0.0 0.0  BASOSABS 0.0 0.0 0.0 0.0 0.0    Chemistries   Recent Labs Lab 08/22/17 0628 08/23/17 0634 08/24/17 0625 08/25/17 1018 08/26/17 0942  NA 138 136 136 134* 135  K 4.0 3.7 4.8 4.4 4.0  CL 98* 96* 100* 97* 97*  CO2 32 32 30 28 29   GLUCOSE 174* 189* 207* 240* 258*  BUN 7 8 9 7 6   CREATININE 0.73 0.73 0.80 0.71 0.76  CALCIUM 8.6* 8.6* 8.3* 9.0 8.9  AST 29 28 37 27 20  ALT 27 29 34 30 23  ALKPHOS 84 93 103 107 107  BILITOT 1.2 1.8* 1.1 1.5* 1.6*    ------------------------------------------------------------------------------------------------------------------ No results for input(s): CHOL, HDL, LDLCALC, TRIG, CHOLHDL, LDLDIRECT in the last 72 hours.  Lab Results  Component Value Date   HGBA1C 6.1 (H) 08/08/2017   ------------------------------------------------------------------------------------------------------------------ No results for input(s): TSH, T4TOTAL, T3FREE, THYROIDAB in the last 72 hours.  Invalid input(s): FREET3 ------------------------------------------------------------------------------------------------------------------ No results for input(s): VITAMINB12, FOLATE, FERRITIN, TIBC, IRON, RETICCTPCT in the last 72 hours.  Coagulation profile No results for input(s): INR, PROTIME in the last 168 hours.  No results for input(s): DDIMER in the last 72 hours.  Cardiac Enzymes No results for input(s): CKMB, TROPONINI, MYOGLOBIN in the last 168 hours.  Invalid input(s): CK ------------------------------------------------------------------------------------------------------------------ No results found for: BNP  Inpatient Medications  Scheduled Meds: . acyclovir  400 mg Oral BID  . atorvastatin  20 mg Oral Daily  . calcium carbonate  1 tablet Oral Q breakfast  . cephALEXin  500 mg Oral QID  . docusate sodium  100 mg Oral BID  . insulin aspart  0-20 Units Subcutaneous TID WC  . insulin aspart  0-5 Units Subcutaneous QHS  . insulin aspart  6 Units Subcutaneous TID WC  . insulin glargine  30 Units Subcutaneous QHS  . levothyroxine  100 mcg Oral QAC breakfast  . lisinopril  5 mg Oral Daily  . loratadine  10 mg Oral Daily  . losartan  25 mg Oral Daily  . pantoprazole  40 mg Oral BID  . polyethylene glycol  17 g Oral BID  . sertraline  100 mg Oral BID   Continuous Infusions:  PRN Meds:.acetaminophen **OR** acetaminophen, alum & mag hydroxide-simeth, diphenhydrAMINE, menthol-cetylpyridinium **OR**  phenol, metoCLOPramide **OR** metoCLOPramide (REGLAN) injection, ondansetron **OR** ondansetron (ZOFRAN) IV, oxyCODONE  Micro Results Recent Results (from the past 240 hour(s))  Culture, Urine     Status: Abnormal   Collection Time: 08/20/17  8:42 PM  Result Value Ref Range Status   Specimen Description URINE, CLEAN CATCH  Final   Special Requests NONE  Final   Culture <10,000 COLONIES/mL INSIGNIFICANT GROWTH (A)  Final   Report Status 08/22/2017 FINAL  Final    Radiology Reports Dg Chest 2 View  Result Date: 08/20/2017 CLINICAL DATA:  Fever EXAM: CHEST  2 VIEW COMPARISON:  None. FINDINGS: The heart size and mediastinal contours are within normal limits. Both lungs are clear. The visualized skeletal structures are unremarkable. IMPRESSION: No active cardiopulmonary disease. Electronically Signed   By: Franchot Gallo M.D.   On:  08/20/2017 18:45   Ct Angio Chest Pe W Or Wo Contrast  Result Date: 08/22/2017 CLINICAL DATA:  Fever and shortness of breath EXAM: CT ANGIOGRAPHY CHEST WITH CONTRAST TECHNIQUE: Multidetector CT imaging of the chest was performed using the standard protocol during bolus administration of intravenous contrast. Multiplanar CT image reconstructions and MIPs were obtained to evaluate the vascular anatomy. CONTRAST:  100 mL Isovue 370 nonionic COMPARISON:  August 20, 2017 FINDINGS: Cardiovascular: There is no demonstrable pulmonary embolus. There is no thoracic aortic aneurysm or dissection. There is atherosclerotic calcification in the proximal left subclavian artery. Visualized great vessels otherwise appear unremarkable. There is atherosclerotic calcification in the aorta as well as foci of coronary artery calcification. Pericardium is not appreciably thickened. Mediastinum/Nodes: Thyroid is not visualized. There are subcentimeter mediastinal lymph nodes without adenopathy by size criteria. No esophageal lesions are evident. Lungs/Pleura: There is no edema or consolidation.  No pleural effusion or pleural thickening evident. Upper Abdomen: Spleen is incompletely visualized but noted to be enlarged. From anterior to posterior dimension, spleen measures 18.7 cm in length. Visualized upper abdominal structures otherwise appear unremarkable. Musculoskeletal: There is degenerative change in the thoracic spine with a degree of diffuse idiopathic skeletal hyperostosis. There are no evident blastic or lytic bone lesions. Review of the MIP images confirms the above findings. IMPRESSION: 1.  No demonstrable pulmonary embolus. 2.  Splenomegaly, incompletely visualized. 3.  No lung edema or consolidation. 4.  No evident adenopathy. 5. Aortic atherosclerosis. Great vessel calcification as well as foci of coronary artery calcification. Aortic Atherosclerosis (ICD10-I70.0). Electronically Signed   By: Lowella Grip III M.D.   On: 08/22/2017 14:06   US Abdomen Complete  Result Date: 08/24/2017 CLINICAL DATA:  Hepatosplenomegaly. EXAM: ABDOMEN ULTRASOUND COMPLETE COMPARISON:  CT 08/22/2017 of the chest. FINDINGS: Gallbladder: Nonobstructing layering calculi are noted within a physiologically distended gallbladder. No wall thickening. Single wall thickness is 1.6 mm. The largest stone is approximately 8 mm. No sonographic Murphy sign noted by sonographer. Common bile duct: Diameter: Normal at 4.5 mm Liver: No focal lesion identified. Increased parenchymal echogenicity. Portal vein is patent on color Doppler imaging with normal direction of blood flow towards the liver. IVC: No abnormality visualized. Pancreas: Visualized portion unremarkable. Spleen: Enlarged spleen measuring 19.8 x 20.9 x 11.3 cm (volume = 2450 cm^3). No space-occupying mass seen. Right Kidney: Length: 11.2 cm. Echogenicity within normal limits. No mass or hydronephrosis visualized. Left Kidney: Length: 10.9 cm. Echogenicity within normal limits. No mass or hydronephrosis visualized. Abdominal aorta: No aneurysm visualized. The  distal aorta and bifurcation were obscured by overlying bowel. Maximum caliber 2.4 cm of the aorta. Other findings: None. IMPRESSION: 1. Splenomegaly with the spleen measuring 19.8 x 20.9 x 11.3 cm. No focal mass. 2. Increased echogenicity of the liver consistent with fatty infiltration. No discrete mass. No definite hepatomegaly. 3. Uncomplicated cholelithiasis. Electronically Signed   By: Ashley Royalty M.D.   On: 08/24/2017 23:09    Time Spent in minutes  66   Remona Boom D M.D on 08/26/2017 at 12:37 PM  Between 7am to 7pm - Pager - [phone]  After 7pm go to [email or web address] - password [facility]  [facility] -  Office  [phone]

## 2017-08-26 NOTE — Progress Notes (Signed)
RN notified PA that pt needs a prescription for oxycodone. PA states she will send prescriptions electronically to pt's pharmacy in [place]. Pt discharge instructions reviewed with pt. Pt verbalizes understanding. Pt belongings with pt. Pt is not in distress. Pt discharged via wheelchair.

## 2017-08-26 NOTE — Progress Notes (Signed)
Physical Therapy Treatment Patient Details Name: Katherine Bell MRN: [id number] DOB: June 05, 1951 Today's Date: 08/26/2017    History of Present Illness Pt is a 66 y/o female s/p elective L TKA. PMH includes HTN, DM, OSA, and L corneal ulcer.     PT Comments    Pt waiting on doppler. Received clearance from PA to work with pt. Pt completed all activities today with little difficulty. Reported slight pain in LLE during SLR. No other issues noted.   Follow Up Recommendations  DC plan and follow up therapy as arranged by surgeon;Supervision for mobility/OOB     Equipment Recommendations  None recommended by PT    Recommendations for Other Services       Precautions / Restrictions Precautions Precautions: Knee Precaution Comments: Reviewed proper positioning for L knee when laying in bed. Required Braces or Orthoses: Knee Immobilizer - Left Restrictions Weight Bearing Restrictions: Yes LLE Weight Bearing: Weight bearing as tolerated    Mobility  Bed Mobility Overal bed mobility: Modified Independent Bed Mobility: Supine to Sit     Supine to sit: Modified independent (Device/Increase time) Sit to supine: Modified independent (Device/Increase time)   General bed mobility comments: Pt able to perform supine to sit and sit to supine without cues  Transfers Overall transfer level: Modified independent Equipment used: Rolling walker (2 wheeled) Transfers: Sit to/from Stand Sit to Stand: Min guard         General transfer comment: Pt demonstrated correct hand placement on walker during standing.   Ambulation/Gait Ambulation/Gait assistance: Min guard Ambulation Distance (Feet): 730 Feet Assistive device: Rolling walker (2 wheeled) Gait Pattern/deviations: Step-through pattern;Decreased stride length;Shuffle;Trunk flexed;Antalgic;Drifts right/left     General Gait Details: Pt. require min VC for gait symmetry. Pt required cues intially to correct driftting to the left  with walker but was able to self correct after.  Pt motivated to progress   Financial trader Rankin (Stroke Patients Only)       Balance Overall balance assessment: Needs assistance   Sitting balance-Leahy Scale: Good       Standing balance-Leahy Scale: Good Standing balance comment: reliant on UE support on RW during mobility                             Cognition Arousal/Alertness: Awake/alert Behavior During Therapy: WFL for tasks assessed/performed Overall Cognitive Status: Within Functional Limits for tasks assessed                                        Exercises Total Joint Exercises Ankle Circles/Pumps: AROM;Both;10 reps Quad Sets: AROM;Left;10 reps;Supine Towel Squeeze: AROM;Left;10 reps;Supine Short Arc Quad: AROM;Left;10 reps;Supine Heel Slides: AROM;Left;10 reps;Seated Hip ABduction/ADduction: AROM;Left;10 reps;Supine Straight Leg Raises: AAROM;Left;10 reps;Supine Long Arc Quad: AROM;Left;10 reps;Supine Knee Flexion: AROM;Left;10 reps;Seated;AAROM Goniometric ROM: 82    General Comments        Pertinent Vitals/Pain Pain Assessment: 0-10 Pain Score: 8  Pain Location: posterior L knee Pain Descriptors / Indicators: Sore;Tightness Pain Intervention(s): Monitored during session;Repositioned;Ice applied    Home Living                      Prior Function            PT Goals (current goals can now  be found in the care plan section) Acute Rehab PT Goals Patient Stated Goal: to go home Time For Goal Achievement: 09/09/17 Potential to Achieve Goals: Good Progress towards PT goals: Progressing toward goals    Frequency    7X/week      PT Plan Current plan remains appropriate    Co-evaluation              AM-PAC PT "6 Clicks" Daily Activity  Outcome Measure  Difficulty turning over in bed (including adjusting bedclothes, sheets and blankets)?: A  Little Difficulty moving from lying on back to sitting on the side of the bed? : A Little Difficulty sitting down on and standing up from a chair with arms (e.g., wheelchair, bedside commode, etc,.)?: A Little Help needed moving to and from a bed to chair (including a wheelchair)?: A Little Help needed walking in hospital room?: A Little Help needed climbing 3-5 steps with a railing? : A Little 6 Click Score: 18    End of Session Equipment Utilized During Treatment: Gait belt Activity Tolerance: Patient tolerated treatment well Patient left: in bed;with bed alarm set;with SCD's reapplied Nurse Communication: Mobility status PT Visit Diagnosis: Other abnormalities of gait and mobility (R26.89);Pain Pain - Right/Left: Left Pain - part of body: Knee     Time: 2353-6144 PT Time Calculation (min) (ACUTE ONLY): 36 min  Charges:  $Gait Training: 8-22 mins $Therapeutic Exercise: 8-22 mins                    G Codes:       Yesmin Mutch,SPTA   Fransisca Connors 08/26/2017, 10:11 AM

## 2017-08-26 NOTE — Progress Notes (Signed)
Preliminary results by tech - Left Lower Ext. Venous Duplex Completed. Negative for deep and superficial vein thrombosis.  Tuwanna Krausz, BS, RDMS, RVT  

## 2017-08-27 DIAGNOSIS — E039 Hypothyroidism, unspecified: Secondary | ICD-10-CM | POA: Diagnosis not present

## 2017-08-27 DIAGNOSIS — H16002 Unspecified corneal ulcer, left eye: Secondary | ICD-10-CM | POA: Diagnosis not present

## 2017-08-27 DIAGNOSIS — I10 Essential (primary) hypertension: Secondary | ICD-10-CM | POA: Diagnosis not present

## 2017-08-27 DIAGNOSIS — Z471 Aftercare following joint replacement surgery: Secondary | ICD-10-CM | POA: Diagnosis not present

## 2017-08-27 DIAGNOSIS — G4733 Obstructive sleep apnea (adult) (pediatric): Secondary | ICD-10-CM | POA: Diagnosis not present

## 2017-08-27 DIAGNOSIS — Z5181 Encounter for therapeutic drug level monitoring: Secondary | ICD-10-CM | POA: Diagnosis not present

## 2017-08-27 DIAGNOSIS — D638 Anemia in other chronic diseases classified elsewhere: Secondary | ICD-10-CM | POA: Diagnosis not present

## 2017-08-27 DIAGNOSIS — K219 Gastro-esophageal reflux disease without esophagitis: Secondary | ICD-10-CM | POA: Diagnosis not present

## 2017-08-27 DIAGNOSIS — Z7984 Long term (current) use of oral hypoglycemic drugs: Secondary | ICD-10-CM | POA: Diagnosis not present

## 2017-08-27 DIAGNOSIS — Z96652 Presence of left artificial knee joint: Secondary | ICD-10-CM | POA: Diagnosis not present

## 2017-08-27 DIAGNOSIS — F419 Anxiety disorder, unspecified: Secondary | ICD-10-CM | POA: Diagnosis not present

## 2017-08-27 DIAGNOSIS — E119 Type 2 diabetes mellitus without complications: Secondary | ICD-10-CM | POA: Diagnosis not present

## 2017-08-28 DIAGNOSIS — Z96652 Presence of left artificial knee joint: Secondary | ICD-10-CM | POA: Diagnosis not present

## 2017-08-28 DIAGNOSIS — H16002 Unspecified corneal ulcer, left eye: Secondary | ICD-10-CM | POA: Diagnosis not present

## 2017-08-28 DIAGNOSIS — I10 Essential (primary) hypertension: Secondary | ICD-10-CM | POA: Diagnosis not present

## 2017-08-28 DIAGNOSIS — Z471 Aftercare following joint replacement surgery: Secondary | ICD-10-CM | POA: Diagnosis not present

## 2017-08-28 DIAGNOSIS — D638 Anemia in other chronic diseases classified elsewhere: Secondary | ICD-10-CM | POA: Diagnosis not present

## 2017-08-28 DIAGNOSIS — E119 Type 2 diabetes mellitus without complications: Secondary | ICD-10-CM | POA: Diagnosis not present

## 2017-08-29 DIAGNOSIS — Z96652 Presence of left artificial knee joint: Secondary | ICD-10-CM | POA: Diagnosis not present

## 2017-08-29 DIAGNOSIS — I10 Essential (primary) hypertension: Secondary | ICD-10-CM | POA: Diagnosis not present

## 2017-08-29 DIAGNOSIS — H16002 Unspecified corneal ulcer, left eye: Secondary | ICD-10-CM | POA: Diagnosis not present

## 2017-08-29 DIAGNOSIS — D638 Anemia in other chronic diseases classified elsewhere: Secondary | ICD-10-CM | POA: Diagnosis not present

## 2017-08-29 DIAGNOSIS — Z471 Aftercare following joint replacement surgery: Secondary | ICD-10-CM | POA: Diagnosis not present

## 2017-08-29 DIAGNOSIS — E119 Type 2 diabetes mellitus without complications: Secondary | ICD-10-CM | POA: Diagnosis not present

## 2017-08-30 DIAGNOSIS — H16002 Unspecified corneal ulcer, left eye: Secondary | ICD-10-CM | POA: Diagnosis not present

## 2017-08-30 DIAGNOSIS — E119 Type 2 diabetes mellitus without complications: Secondary | ICD-10-CM | POA: Diagnosis not present

## 2017-08-30 DIAGNOSIS — D638 Anemia in other chronic diseases classified elsewhere: Secondary | ICD-10-CM | POA: Diagnosis not present

## 2017-08-30 DIAGNOSIS — Z96652 Presence of left artificial knee joint: Secondary | ICD-10-CM | POA: Diagnosis not present

## 2017-08-30 DIAGNOSIS — Z471 Aftercare following joint replacement surgery: Secondary | ICD-10-CM | POA: Diagnosis not present

## 2017-08-30 DIAGNOSIS — I10 Essential (primary) hypertension: Secondary | ICD-10-CM | POA: Diagnosis not present

## 2017-09-01 DIAGNOSIS — Z96652 Presence of left artificial knee joint: Secondary | ICD-10-CM | POA: Diagnosis not present

## 2017-09-01 DIAGNOSIS — H16002 Unspecified corneal ulcer, left eye: Secondary | ICD-10-CM | POA: Diagnosis not present

## 2017-09-01 DIAGNOSIS — D638 Anemia in other chronic diseases classified elsewhere: Secondary | ICD-10-CM | POA: Diagnosis not present

## 2017-09-01 DIAGNOSIS — E119 Type 2 diabetes mellitus without complications: Secondary | ICD-10-CM | POA: Diagnosis not present

## 2017-09-01 DIAGNOSIS — I10 Essential (primary) hypertension: Secondary | ICD-10-CM | POA: Diagnosis not present

## 2017-09-01 DIAGNOSIS — Z471 Aftercare following joint replacement surgery: Secondary | ICD-10-CM | POA: Diagnosis not present

## 2017-09-02 DIAGNOSIS — M1712 Unilateral primary osteoarthritis, left knee: Secondary | ICD-10-CM | POA: Diagnosis not present

## 2017-09-03 ENCOUNTER — Ambulatory Visit (HOSPITAL_BASED_OUTPATIENT_CLINIC_OR_DEPARTMENT_OTHER): Payer: Medicare Other

## 2017-09-03 ENCOUNTER — Other Ambulatory Visit (HOSPITAL_COMMUNITY)
Admission: RE | Admit: 2017-09-03 | Discharge: 2017-09-03 | Disposition: A | Discharge status: Home / Self Care | Payer: Medicare Other | Source: Ambulatory Visit | Attending: Hematology and Oncology | Admitting: Hematology and Oncology

## 2017-09-03 ENCOUNTER — Telehealth: Payer: Self-pay | Admitting: Hematology and Oncology

## 2017-09-03 ENCOUNTER — Encounter: Payer: Self-pay | Admitting: Hematology and Oncology

## 2017-09-03 ENCOUNTER — Ambulatory Visit (HOSPITAL_BASED_OUTPATIENT_CLINIC_OR_DEPARTMENT_OTHER): Payer: Medicare Other | Admitting: Hematology and Oncology

## 2017-09-03 VITALS — BP 150/87 | HR 115 | Temp 98.6°F | Resp 17 | Wt 180.8 lb

## 2017-09-03 DIAGNOSIS — R161 Splenomegaly, not elsewhere classified: Secondary | ICD-10-CM | POA: Insufficient documentation

## 2017-09-03 DIAGNOSIS — R112 Nausea with vomiting, unspecified: Secondary | ICD-10-CM | POA: Diagnosis not present

## 2017-09-03 DIAGNOSIS — D61818 Other pancytopenia: Secondary | ICD-10-CM

## 2017-09-03 DIAGNOSIS — D509 Iron deficiency anemia, unspecified: Secondary | ICD-10-CM | POA: Diagnosis not present

## 2017-09-03 LAB — CBC & DIFF AND RETIC
BASO%: 0.2 % (ref 0.0–2.0)
BASOS ABS: 0 10*3/uL (ref 0.0–0.1)
EOS ABS: 0 10*3/uL (ref 0.0–0.5)
EOS%: 0.4 % (ref 0.0–7.0)
HEMATOCRIT: 41.4 % (ref 34.8–46.6)
HEMOGLOBIN: 13.6 g/dL (ref 11.6–15.9)
IMMATURE RETIC FRACT: 6.7 % (ref 1.60–10.00)
LYMPH%: 13.6 % — AB (ref 14.0–49.7)
MCH: 25 pg — ABNORMAL LOW (ref 25.1–34.0)
MCHC: 32.9 g/dL (ref 31.5–36.0)
MCV: 76.1 fL — AB (ref 79.5–101.0)
MONO#: 0.4 10*3/uL (ref 0.1–0.9)
MONO%: 4.7 % (ref 0.0–14.0)
NEUT%: 81.1 % — AB (ref 38.4–76.8)
NEUTROS ABS: 6.7 10*3/uL — AB (ref 1.5–6.5)
Platelets: 234 10*3/uL (ref 145–400)
RBC: 5.44 10*6/uL (ref 3.70–5.45)
RDW: 16.7 % — ABNORMAL HIGH (ref 11.2–14.5)
Retic %: 2.26 % — ABNORMAL HIGH (ref 0.70–2.10)
Retic Ct Abs: 122.94 10*3/uL — ABNORMAL HIGH (ref 33.70–90.70)
WBC: 8.3 10*3/uL (ref 3.9–10.3)
lymph#: 1.1 10*3/uL (ref 0.9–3.3)

## 2017-09-03 LAB — COMPREHENSIVE METABOLIC PANEL
ALBUMIN: 4.5 g/dL (ref 3.5–5.0)
ALK PHOS: 132 U/L (ref 40–150)
ALT: 19 U/L (ref 0–55)
AST: 21 U/L (ref 5–34)
Anion Gap: 15 mEq/L — ABNORMAL HIGH (ref 3–11)
BUN: 23.6 mg/dL (ref 7.0–26.0)
CHLORIDE: 96 meq/L — AB (ref 98–109)
CO2: 23 mEq/L (ref 22–29)
Calcium: 10.7 mg/dL — ABNORMAL HIGH (ref 8.4–10.4)
Creatinine: 1 mg/dL (ref 0.6–1.1)
EGFR: 55 mL/min/{1.73_m2} — ABNORMAL LOW (ref >60)
GLUCOSE: 259 mg/dL — AB (ref 70–140)
POTASSIUM: 3.8 meq/L (ref 3.5–5.1)
SODIUM: 134 meq/L — AB (ref 136–145)
Total Bilirubin: 0.73 mg/dL (ref 0.20–1.20)
Total Protein: 8.7 g/dL — ABNORMAL HIGH (ref 6.4–8.3)

## 2017-09-03 LAB — IRON AND TIBC
%SAT: 12 % — AB (ref 21–57)
IRON: 44 ug/dL (ref 41–142)
TIBC: 375 ug/dL (ref 236–444)
UIBC: 331 ug/dL (ref 120–384)

## 2017-09-03 LAB — LACTATE DEHYDROGENASE: LDH: 248 U/L — ABNORMAL HIGH (ref 125–245)

## 2017-09-03 LAB — URIC ACID: Uric Acid, Serum: 8.2 mg/dl — ABNORMAL HIGH (ref 2.6–7.4)

## 2017-09-03 LAB — MORPHOLOGY: PLT EST: ADEQUATE

## 2017-09-03 LAB — FERRITIN: Ferritin: 309 ng/ml — ABNORMAL HIGH (ref 9–269)

## 2017-09-03 NOTE — Telephone Encounter (Signed)
Scheduled appt per 10//30 los - Gave patient AVS and calender per los.  

## 2017-09-04 LAB — EPSTEIN-BARR VIRUS VCA, IGG: EBV VCA IgG: 314 U/mL — ABNORMAL HIGH (ref 0.0–17.9)

## 2017-09-04 LAB — EPSTEIN-BARR VIRUS VCA, IGM: EBV VCA IgM: 96.4 U/mL — ABNORMAL HIGH (ref 0.0–35.9)

## 2017-09-04 LAB — HTLV I+II ANTIBODIES, (EIA), BLD: HTLV-I/II Antibodies, Qual: NEGATIVE

## 2017-09-04 LAB — ANTINUCLEAR ANTIBODIES, IFA: ANA Titer 1: NEGATIVE

## 2017-09-04 LAB — LIPASE: LIPASE: 55 U/L (ref 14–72)

## 2017-09-04 LAB — DIRECT ANTIGLOBULIN TEST (NOT AT ARMC): COOMBS', DIRECT: NEGATIVE

## 2017-09-04 LAB — EPSTEIN-BARR VIRUS NUCLEAR ANTIGEN ANTIBODY, IGG: EBV NA IGG: 490 U/mL — AB (ref 0.0–17.9)

## 2017-09-04 LAB — CMV IGM: CMV IGM SER EIA-ACNC: 74.2 [AU]/ml — AB (ref 0.0–29.9)

## 2017-09-04 LAB — RHEUMATOID FACTOR: RA Latex Turbid.: 12.9 IU/mL (ref 0.0–13.9)

## 2017-09-04 LAB — CMV ANTIBODY, IGG (EIA): CMV Ab - IgG: >10 U/mL — ABNORMAL HIGH (ref 0.00–0.59)

## 2017-09-04 LAB — AMYLASE: AMYLASE: 53 U/L (ref 31–124)

## 2017-09-04 LAB — HAPTOGLOBIN: HAPTOGLOBIN: 245 mg/dL — AB (ref 34–200)

## 2017-09-04 LAB — EPSTEIN-BARR VIRUS EARLY D ANTIGEN ANTIBODY, IGG: EBV EARLY ANTIGEN AB, IGG: 67.9 U/mL — AB (ref 0.0–8.9)

## 2017-09-05 DIAGNOSIS — M1712 Unilateral primary osteoarthritis, left knee: Secondary | ICD-10-CM | POA: Diagnosis not present

## 2017-09-08 DIAGNOSIS — M1712 Unilateral primary osteoarthritis, left knee: Secondary | ICD-10-CM | POA: Diagnosis not present

## 2017-09-08 NOTE — Addendum Note (Signed)
Addendum  created 09/08/17 1030 by Roberts Gaudy, MD   Child order released for a procedure order, Intraprocedure Blocks edited, Sign clinical note

## 2017-09-08 NOTE — Anesthesia Procedure Notes (Addendum)
Anesthesia Regional Block: Adductor canal block   Pre-Anesthetic Checklist: ,, timeout performed, Correct Patient, Correct Site, Correct Laterality, Correct Procedure, Correct Position, site marked, Risks and benefits discussed,  Surgical consent,  Pre-op evaluation,  At surgeon's request and post-op pain management  Laterality: Left  Prep: Maximum Sterile Barrier Precautions used, chloraprep       Needles:  Injection technique: Single-shot  Needle Type: Echogenic Stimulator Needle     Needle Length: 9cm  Needle Gauge: 22     Additional Needles:   Procedures: Doppler guided,,,, ultrasound used (permanent image in chart),,,,  Narrative:  Start time: 09/08/2017 9:40 AM End time: 09/08/2017 9:45 AM Injection made incrementally with aspirations every 5 mL.  Performed by: Personally  Anesthesiologist: Roberts Gaudy, MD  Additional Notes: 20 cc 0.75% Ropivacaine injected easily

## 2017-09-09 LAB — FLOW CYTOMETRY

## 2017-09-10 DIAGNOSIS — M1712 Unilateral primary osteoarthritis, left knee: Secondary | ICD-10-CM | POA: Diagnosis not present

## 2017-09-12 ENCOUNTER — Other Ambulatory Visit: Payer: Self-pay | Admitting: Radiology

## 2017-09-12 ENCOUNTER — Other Ambulatory Visit: Payer: Self-pay | Admitting: General Surgery

## 2017-09-12 DIAGNOSIS — D61818 Other pancytopenia: Secondary | ICD-10-CM | POA: Insufficient documentation

## 2017-09-12 DIAGNOSIS — D509 Iron deficiency anemia, unspecified: Secondary | ICD-10-CM | POA: Insufficient documentation

## 2017-09-12 NOTE — Assessment & Plan Note (Signed)
66 y.o. female referred to our clinic for splenomegaly, presenting with pancytopenia with lymphopenia, moderate microcytic hypochromic anemia, and mild thrombocytopenia.  Spleen is significantly enlarged, there is also abnormal appearance of the liver with increased echogenicity of the liver parenchyma in the context of mild elevation of bilirubin, normal transaminases, and history of acholic stools while imaging demonstrates presence of cholelithiasis no obvious signs of pancreatitis.  Splenomegaly: No portal hypertension note to justify splenomegaly by hepatic abnormalities.  Differential includes lymphoproliferative process versus reactive splenomegaly due to infection.  We will obtain additional lab work  Pancytopenia: Patient has lymphopenia consistent with either a lymphoproliferative, myeloproliferative, or infection process.  Anemia is microcytic and hypochromic in nature consistent with iron deficiency considering recent surgery with significant blood loss requiring blood transfusions.  Patient may still have other sources of bleeding.  Iron level has not been checked recently and he is to be evaluated.  The presence of significant hematological abnormalities proceeding directly to bone marrow is likely indicated.  Acholic stools, nausea, vomiting: Constellation of nephrolithiasis, treated bilirubin, acholic stools are consistent with likely passage of gallstones with intermittent obstruction.  No active cholecystitis noted at this time.  Will check lab work to assess for possible pancreatitis, and in absence of that, will consider referring patient to the general surgery for possible cholecystectomy once her hematological profile is fully evaluated and addressed.  Plan: --Labs today --IR for BM Bx --RTC 1 week after BM Bx to review findings

## 2017-09-12 NOTE — Progress Notes (Signed)
[place] [facility] New Visit:  Assessment: Pancytopenia [facility]) 66 y.o. female referred to our clinic for splenomegaly, presenting with pancytopenia with lymphopenia, moderate microcytic hypochromic anemia, and mild thrombocytopenia.  Spleen is significantly enlarged, there is also abnormal appearance of the liver with increased echogenicity of the liver parenchyma in the context of mild elevation of bilirubin, normal transaminases, and history of acholic stools while imaging demonstrates presence of cholelithiasis no obvious signs of pancreatitis.  Splenomegaly: No portal hypertension note to justify splenomegaly by hepatic abnormalities.  Differential includes lymphoproliferative process versus reactive splenomegaly due to infection.  We will obtain additional lab work  Pancytopenia: Patient has lymphopenia consistent with either a lymphoproliferative, myeloproliferative, or infection process.  Anemia is microcytic and hypochromic in nature consistent with iron deficiency considering recent surgery with significant blood loss requiring blood transfusions.  Patient may still have other sources of bleeding.  Iron level has not been checked recently and he is to be evaluated.  The presence of significant hematological abnormalities proceeding directly to bone marrow is likely indicated.  Acholic stools, nausea, vomiting: Constellation of nephrolithiasis, treated bilirubin, acholic stools are consistent with likely passage of gallstones with intermittent obstruction.  No active cholecystitis noted at this time.  Will check lab work to assess for possible pancreatitis, and in absence of that, will consider referring patient to the general surgery for possible cholecystectomy once her hematological profile is fully evaluated and addressed.  Plan: --Labs today --IR for BM Bx --RTC 1 week after BM Bx to review findings  Voice recognition software was used and creation of this note. Despite my  best effort at editing the text, some misspelling/errors may have occurred.  Orders Placed This Encounter  Procedures  . CT Biopsy    Standing Status:   Future    Standing Expiration Date:   09/03/2018    Order Specific Question:   Lab orders requested (DO NOT place separate lab orders, these will be automatically ordered during procedure specimen collection):    Answer:   Cytology - Non Pap    Comments:   Flow cyto, FISH for AML/MDS    Order Specific Question:   Lab orders requested (DO NOT place separate lab orders, these will be automatically ordered during procedure specimen collection):    Answer:   Surgical Pathology    Order Specific Question:   Lab orders requested (DO NOT place separate lab orders, these will be automatically ordered during procedure specimen collection):    Answer:   Other    Order Specific Question:   Reason for Exam (SYMPTOM  OR DIAGNOSIS REQUIRED)    Answer:   Pancytopenia, splenomegaly -- pelase eval for possible myeloproliferative/myelodysplastic process    Order Specific Question:   Preferred imaging location?    Answer:   [facility]    Order Specific Question:   Radiology Contrast Protocol - do NOT remove file path    Answer:   \\[email or web address]  . CT BONE MARROW BIOPSY & ASPIRATION    Standing Status:   Future    Standing Expiration Date:   12/04/2018    Order Specific Question:   Reason for Exam (SYMPTOM  OR DIAGNOSIS REQUIRED)    Answer:   Pancytopenia, splenomegaly -- pelase eval for possible myeloproliferative/myelodysplastic process    Order Specific Question:   Preferred imaging location?    Answer:   [facility]    Order Specific Question:   Radiology Contrast Protocol - do NOT remove file path  Answer:   \\[email or web address]  . CBC & Diff and Retic    Standing Status:   Future    Number of Occurrences:   1    Standing Expiration Date:   09/03/2018  . Morphology     Standing Status:   Future    Number of Occurrences:   1    Standing Expiration Date:   09/03/2018  . Comprehensive metabolic panel    Standing Status:   Future    Number of Occurrences:   1    Standing Expiration Date:   09/03/2018  . Lactate dehydrogenase (LDH)    Standing Status:   Future    Number of Occurrences:   1    Standing Expiration Date:   09/03/2018  . Uric acid    Standing Status:   Future    Number of Occurrences:   1    Standing Expiration Date:   09/03/2018  . Ferritin    Standing Status:   Future    Number of Occurrences:   1    Standing Expiration Date:   09/03/2018  . Iron and TIBC    Standing Status:   Future    Number of Occurrences:   1    Standing Expiration Date:   09/03/2018  . Amylase    Standing Status:   Future    Number of Occurrences:   1    Standing Expiration Date:   09/03/2018  . Lipase    Standing Status:   Future    Number of Occurrences:   1    Standing Expiration Date:   09/03/2018  . Flow Cytometry    Standing Status:   Future    Number of Occurrences:   1    Standing Expiration Date:   09/03/2018  . CMV IgM    Standing Status:   Future    Number of Occurrences:   1    Standing Expiration Date:   09/03/2018  . CMV antibody, IgG (EIA)    Standing Status:   Future    Number of Occurrences:   1    Standing Expiration Date:   09/03/2018  . Epstein-Barr virus VCA, IgG    Standing Status:   Future    Number of Occurrences:   1    Standing Expiration Date:   09/03/2018  . Epstein-Barr virus VCA, IgM    Standing Status:   Future    Number of Occurrences:   1    Standing Expiration Date:   09/03/2018  . Epstein-Barr virus early D antigen antibody, IgG    Standing Status:   Future    Number of Occurrences:   1    Standing Expiration Date:   09/03/2018  . Epstein-Barr virus nuclear antigen antibody, IgG    Standing Status:   Future    Number of Occurrences:   1    Standing Expiration Date:   09/03/2018  . HTLV 1+2 antibodies,  (EIA), bld    Standing Status:   Future    Number of Occurrences:   1    Standing Expiration Date:   09/03/2018  . ANA, IFA (with reflex)    Standing Status:   Future    Number of Occurrences:   1    Standing Expiration Date:   09/03/2018  . Rheumatoid factor    Standing Status:   Future    Number of Occurrences:   1    Standing Expiration Date:   09/03/2018  .  Haptoglobin    Standing Status:   Future    Number of Occurrences:   1    Standing Expiration Date:   09/03/2018  . Direct antiglobulin test (Coombs)    Standing Status:   Future    Number of Occurrences:   1    Standing Expiration Date:   09/03/2018    All questions were answered.  . The patient knows to call the clinic with any problems, questions or concerns.  This note was electronically signed.    History of Presenting Illness Katherine Bell 66 y.o. presenting to the [place] for evaluation of splenomegaly, referred by Dr Flint Melter.  See results of the lab work and imaging in the oncological/hematological history.  Patient reports feeling well until undergoing total knee replacement surgery.  Preoperative labs demonstrated anemia, nevertheless patient proceeded with the surgery with 3 units of packed red blood cells transfusion.  Postoperative period was complicated by febrile illness.  Since then, patient has been having intermittent nausea, upset stomach, and intermittent vomiting.  She also complains of diarrhea without melena, but with very lightly colored stool, often white in color.  Patient reports nocturnal sweats that are soaking since she has had her surgery.  Over the past 2 weeks she reports losing about 15 pounds in weight.  Patient denies objective fever.  Denies any visual changes or new extremity weakness or sensory deficits.  Denies chest pain, shortness of breath, or cough.  No swelling in the neck, armpits, or groin.  Denies any new urinary symptoms, hematuria, or  dysuria.  Oncological/hematological History: --CTA Chest, 08/22/17: No evidence of pulmonary embolism, no mediastinal or hilar lymphadenopathy.  Suspecting splenomegaly with spleen measuring at least 18.7 cm. --US Abdomen, 08/24/17: Increased parenchymal echogenicity in the liver with normal portal flow.  Gallstones in the gallbladder.  Spleen measuring 19.8 x 20.9 x 11.3 cm. --Labs, 08/26/17: WBC 2.5, ANC 2.0, ALC 0.3, Mono 0.2, Hgb 9.0, MCV 77.6, MCH 24.9, RDW 15.9, Plt 111; tBili 1.6, tProt 5.6, Alb 3.2, AP 107, AST 20, ALT 23    Medical History: Past Medical History:  Diagnosis Date  . Acute blood loss as cause of postoperative anemia 08/19/2017  . Anemia of chronic disease 08/19/2017  . Anxiety   . Corneal ulcer, left   . Diabetes mellitus without complication ([place])   . GERD (gastroesophageal reflux disease)   . History of kidney stones   . Hypercholesteremia   . Hypertension   . Hypothyroidism   . Osteoarthritis of left knee   . Primary localized osteoarthrosis of the knee, left 08/06/2017  . Sleep apnea, obstructive    used for a year -off for a year    Surgical History: Past Surgical History:  Procedure Laterality Date  . ABDOMINAL HYSTERECTOMY  1996  . BLADDER SUSPENSION  1997   4 different procedures  . CATARACT EXTRACTION W/ INTRAOCULAR LENS IMPLANT Left 2004  . DACROCYSTORHINOSTOMY W/ JONES TUBE Left 2004   for tear duct  . EYE SURGERY      Family History: Family History  Problem Relation Age of Onset  . Suicidality Father   . Diabetes Brother   . Hypertension Brother   . Heart disease Brother     Social History: Social History   Socioeconomic History  . Marital status: Married    Spouse name: Not on file  . Number of children: Not on file  . Years of education: Not on file  . Highest education level: Not on  file  Social Needs  . Financial resource strain: Not on file  . Food insecurity - worry: Not on file  . Food insecurity - inability: Not on  file  . Transportation needs - medical: Not on file  . Transportation needs - non-medical: Not on file  Occupational History  . Not on file  Tobacco Use  . Smoking status: Never Smoker  . Smokeless tobacco: Never Used  Substance and Sexual Activity  . Alcohol use: No  . Drug use: No  . Sexual activity: Not on file  Other Topics Concern  . Not on file  Social History Narrative  . Not on file    Allergies: Allergies  Allergen Reactions  . Penicillins Hives    Has taken keflex without difficulty       . Sulfa Antibiotics Hives    Medications:  Current Outpatient Medications  Medication Sig Dispense Refill  . acetaminophen (TYLENOL) 325 MG tablet Take 2 tablets (650 mg total) by mouth every 6 (six) hours as needed for mild pain (or Fever >/= 101).    Marland Kitchen acyclovir (ZOVIRAX) 400 MG tablet Take 400 mg by mouth 2 (two) times daily.    Marland Kitchen atorvastatin (LIPITOR) 40 MG tablet Take 20 mg by mouth daily.     . Calcium Carbonate (CALCIUM 600 PO) Take 1,200 mg by mouth daily.     . cephALEXin (KEFLEX) 500 MG capsule Take 1 capsule (500 mg total) by mouth 4 (four) times daily. 40 capsule 0  . docusate sodium (COLACE) 100 MG capsule 1 tab 2 times a day while on narcotics.  STOOL SOFTENER 60 capsule 0  . fexofenadine (ALLEGRA) 180 MG tablet Take 180 mg by mouth daily as needed for allergies.     Marland Kitchen glipiZIDE (GLUCOTROL) 5 MG tablet Take 5 mg by mouth 2 (two) times daily before a meal.    . levothyroxine (SYNTHROID, LEVOTHROID) 100 MCG tablet Take 100 mcg by mouth daily before breakfast.    . lisinopril (PRINIVIL,ZESTRIL) 5 MG tablet Take 5 mg by mouth daily.     Marland Kitchen losartan (COZAAR) 25 MG tablet Take 25 mg by mouth daily.     . metFORMIN (GLUCOPHAGE) 1000 MG tablet Take 1,000 mg by mouth 2 (two) times daily with a meal.    . Multiple Vitamins-Minerals (CENTRUM SILVER ULTRA WOMENS PO) Take 0.5 tablets by mouth 2 (two) times daily.     Marland Kitchen oxyCODONE (OXY IR/ROXICODONE) 5 MG immediate release tablet  Take 1-2 tablets (5-10 mg total) by mouth every 3 (three) hours as needed for breakthrough pain. 30 tablet 0  . pantoprazole (PROTONIX) 40 MG tablet Take 40 mg by mouth 2 (two) times daily.    . polyethylene glycol (MIRALAX / GLYCOLAX) packet 17grams in 6 oz of water twice a day until bowel movement.  LAXITIVE.  Restart if two days since last bowel movement 14 each 0  . sertraline (ZOLOFT) 100 MG tablet Take 100 mg by mouth 2 (two) times daily.    . VOLTAREN 1 % GEL Apply 1 application topically 3 (three) times daily.  5   No current facility-administered medications for this visit.     Review of Systems: Review of Systems  Constitutional: Positive for diaphoresis and unexpected weight change.  Gastrointestinal: Positive for abdominal pain, diarrhea, nausea and vomiting. Negative for blood in stool.  All other systems reviewed and are negative.    PHYSICAL EXAMINATION Blood pressure (!) 150/87, pulse (!) 115, temperature 98.6 F (37 C), temperature source  Oral, resp. rate 17, weight 180 lb 12.8 oz (82 kg), SpO2 99 %.  ECOG PERFORMANCE STATUS: 2 - Symptomatic, <50% confined to bed  Physical Exam  Constitutional: She is oriented to person, place, and time and well-developed, well-nourished, and in no distress. No distress.  HENT:  Head: Normocephalic and atraumatic.  Mouth/Throat: Oropharynx is clear and moist. No oropharyngeal exudate.  Eyes: Conjunctivae and EOM are normal. Pupils are equal, round, and reactive to light. No scleral icterus.  Neck: No thyromegaly present.  Cardiovascular: Normal rate, regular rhythm and normal heart sounds. Exam reveals no gallop.  No murmur heard. Pulmonary/Chest: Effort normal and breath sounds normal. No respiratory distress. She has no wheezes. She has no rales.  Abdominal: Soft. Bowel sounds are normal. She exhibits no distension and no mass. There is no tenderness.  Musculoskeletal: Normal range of motion. She exhibits no edema.   Lymphadenopathy:    She has no cervical adenopathy.  Neurological: She is alert and oriented to person, place, and time. She has normal reflexes. No cranial nerve deficit.  Skin: Skin is warm and dry. No rash noted. She is not diaphoretic. No erythema. No pallor.     LABORATORY DATA: I have personally reviewed the data as listed: Appointment on 09/03/2017  Component Date Value Ref Range Status  . Haptoglobin 09/03/2017 245* 34 - 200 mg/dL Final  . Coombs', Direct 09/03/2017 Negative  Negative Final  . WBC 09/03/2017 8.3  3.9 - 10.3 10e3/uL Final  . NEUT# 09/03/2017 6.7* 1.5 - 6.5 10e3/uL Final  . HGB 09/03/2017 13.6  11.6 - 15.9 g/dL Final  . HCT 09/03/2017 41.4  34.8 - 46.6 % Final  . Platelets 09/03/2017 234  145 - 400 10e3/uL Final  . MCV 09/03/2017 76.1* 79.5 - 101.0 fL Final  . MCH 09/03/2017 25.0* 25.1 - 34.0 pg Final  . MCHC 09/03/2017 32.9  31.5 - 36.0 g/dL Final  . RBC 09/03/2017 5.44  3.70 - 5.45 10e6/uL Final  . RDW 09/03/2017 16.7* 11.2 - 14.5 % Final  . lymph# 09/03/2017 1.1  0.9 - 3.3 10e3/uL Final  . MONO# 09/03/2017 0.4  0.1 - 0.9 10e3/uL Final  . Eosinophils Absolute 09/03/2017 0.0  0.0 - 0.5 10e3/uL Final  . Basophils Absolute 09/03/2017 0.0  0.0 - 0.1 10e3/uL Final  . NEUT% 09/03/2017 81.1* 38.4 - 76.8 % Final  . LYMPH% 09/03/2017 13.6* 14.0 - 49.7 % Final  . MONO% 09/03/2017 4.7  0.0 - 14.0 % Final  . EOS% 09/03/2017 0.4  0.0 - 7.0 % Final  . BASO% 09/03/2017 0.2  0.0 - 2.0 % Final  . Retic % 09/03/2017 2.26* 0.70 - 2.10 % Final  . Retic Ct Abs 09/03/2017 122.94* 33.70 - 90.70 10e3/uL Final  . Immature Retic Fract 09/03/2017 6.70  1.60 - 10.00 % Final  . Ovalocytes 09/03/2017 Few  Negative Final  . White Cell Comments 09/03/2017 C/W auto diff   Final  . PLT EST 09/03/2017 Adequate  Adequate Final  . Sodium 09/03/2017 134* 136 - 145 mEq/L Final  . Potassium 09/03/2017 3.8  3.5 - 5.1 mEq/L Final  . Chloride 09/03/2017 96* 98 - 109 mEq/L Final  . CO2  09/03/2017 23  22 - 29 mEq/L Final  . Glucose 09/03/2017 259* 70 - 140 mg/dl Final   Glucose reference range is for nonfasting patients. Fasting glucose reference range is 70- 100.  Marland Kitchen BUN 09/03/2017 23.6  7.0 - 26.0 mg/dL Final  . Creatinine 09/03/2017 1.0  0.6 -  1.1 mg/dL Final  . Total Bilirubin 09/03/2017 0.73  0.20 - 1.20 mg/dL Final  . Alkaline Phosphatase 09/03/2017 132  40 - 150 U/L Final  . AST 09/03/2017 21  5 - 34 U/L Final  . ALT 09/03/2017 19  0 - 55 U/L Final  . Total Protein 09/03/2017 8.7* 6.4 - 8.3 g/dL Final  . Albumin 09/03/2017 4.5  3.5 - 5.0 g/dL Final  . Calcium 09/03/2017 10.7* 8.4 - 10.4 mg/dL Final  . Anion Gap 09/03/2017 15* 3 - 11 mEq/L Final  . EGFR 09/03/2017 55* >60 ml/min/1.73 m2 Final   eGFR is calculated using the CKD-EPI Creatinine Equation (2009)  . LDH 09/03/2017 248* 125 - 245 U/L Final  . Uric Acid, Serum 09/03/2017 8.2* 2.6 - 7.4 mg/dl Final  . Ferritin 09/03/2017 309* 9 - 269 ng/ml Final  . Iron 09/03/2017 44  41 - 142 ug/dL Final  . TIBC 09/03/2017 375  236 - 444 ug/dL Final  . UIBC 09/03/2017 331  120 - 384 ug/dL Final  . %SAT 09/03/2017 12* 21 - 57 % Final  . Amylase, Serum 09/03/2017 53  31 - 124 U/L Final  . Lipase 09/03/2017 55  14 - 72 U/L Final  . Flow Cytometry 09/03/2017 See Separate Report   Final  . CMV IgM Ser EIA-aCnc 09/03/2017 74.2* 0.0 - 29.9 AU/mL Final   Comment:                                                Negative         <30.0                                                Equivocal  30.0 - 34.9                                                Positive         >34.9                A positive result is generally indicative of acute                infection, reactivation or persistent IgM production.   . CMV Ab - IgG 09/03/2017 >10.00* 0.00 - 0.59 U/mL Final   Comment:                                               Negative          <0.60                                               Equivocal   0.60 - 0.69  Positive          >0.69   . EBV VCA IgG 09/03/2017 314.0* 0.0 - 17.9 U/mL Final   Comment:                                                 Negative        <18.0                                                 Equivocal 18.0 - 21.9                                                 Positive        >21.9   . EBV VCA IgM 09/03/2017 96.4* 0.0 - 35.9 U/mL Final   Comment:                                                 Negative        <36.0                                                 Equivocal 36.0 - 43.9                                                 Positive        >43.9   . EBV Early Antigen Ab, IgG 09/03/2017 67.9* 0.0 - 8.9 U/mL Final   Comment:                                                 Negative        < 9.0                                                 Equivocal  9.0 - 10.9                                                 Positive        >10.9   . EBV NA IgG 09/03/2017 490.0* 0.0 - 17.9 U/mL Final   Comment:  Negative        <18.0                                                 Equivocal 18.0 - 21.9                                                 Positive        >21.9   . HTLV-I/II Antibodies, Qual 09/03/2017 Negative  Negative Final  . ANA Titer 1 09/03/2017 Negative   Final   Comment:                                                     Negative   <1:80                                                     Borderline  1:80                                                     Positive   >1:80   . RA Latex Turbid. 09/03/2017 12.9  0.0 - 13.9 IU/mL Final         Ardath Sax, MD

## 2017-09-15 ENCOUNTER — Encounter (HOSPITAL_COMMUNITY): Payer: Self-pay

## 2017-09-15 ENCOUNTER — Ambulatory Visit (HOSPITAL_COMMUNITY)
Admission: RE | Admit: 2017-09-15 | Discharge: 2017-09-15 | Disposition: A | Discharge status: Home / Self Care | Payer: Medicare Other | Source: Ambulatory Visit | Attending: Hematology and Oncology | Admitting: Hematology and Oncology

## 2017-09-15 DIAGNOSIS — G4733 Obstructive sleep apnea (adult) (pediatric): Secondary | ICD-10-CM | POA: Insufficient documentation

## 2017-09-15 DIAGNOSIS — K219 Gastro-esophageal reflux disease without esophagitis: Secondary | ICD-10-CM | POA: Insufficient documentation

## 2017-09-15 DIAGNOSIS — Z7984 Long term (current) use of oral hypoglycemic drugs: Secondary | ICD-10-CM | POA: Insufficient documentation

## 2017-09-15 DIAGNOSIS — Z87442 Personal history of urinary calculi: Secondary | ICD-10-CM | POA: Insufficient documentation

## 2017-09-15 DIAGNOSIS — D509 Iron deficiency anemia, unspecified: Secondary | ICD-10-CM

## 2017-09-15 DIAGNOSIS — E7800 Pure hypercholesterolemia, unspecified: Secondary | ICD-10-CM | POA: Diagnosis not present

## 2017-09-15 DIAGNOSIS — K8020 Calculus of gallbladder without cholecystitis without obstruction: Secondary | ICD-10-CM | POA: Diagnosis not present

## 2017-09-15 DIAGNOSIS — D61818 Other pancytopenia: Secondary | ICD-10-CM | POA: Insufficient documentation

## 2017-09-15 DIAGNOSIS — E039 Hypothyroidism, unspecified: Secondary | ICD-10-CM | POA: Insufficient documentation

## 2017-09-15 DIAGNOSIS — Z79899 Other long term (current) drug therapy: Secondary | ICD-10-CM | POA: Diagnosis not present

## 2017-09-15 DIAGNOSIS — D7589 Other specified diseases of blood and blood-forming organs: Secondary | ICD-10-CM | POA: Diagnosis not present

## 2017-09-15 DIAGNOSIS — I700 Atherosclerosis of aorta: Secondary | ICD-10-CM | POA: Insufficient documentation

## 2017-09-15 DIAGNOSIS — F419 Anxiety disorder, unspecified: Secondary | ICD-10-CM | POA: Insufficient documentation

## 2017-09-15 DIAGNOSIS — D638 Anemia in other chronic diseases classified elsewhere: Secondary | ICD-10-CM | POA: Insufficient documentation

## 2017-09-15 DIAGNOSIS — I10 Essential (primary) hypertension: Secondary | ICD-10-CM | POA: Insufficient documentation

## 2017-09-15 DIAGNOSIS — M1712 Unilateral primary osteoarthritis, left knee: Secondary | ICD-10-CM | POA: Diagnosis not present

## 2017-09-15 DIAGNOSIS — R161 Splenomegaly, not elsewhere classified: Secondary | ICD-10-CM | POA: Insufficient documentation

## 2017-09-15 DIAGNOSIS — E119 Type 2 diabetes mellitus without complications: Secondary | ICD-10-CM | POA: Insufficient documentation

## 2017-09-15 LAB — CBC WITH DIFFERENTIAL/PLATELET
BASOS ABS: 0 10*3/uL (ref 0.0–0.1)
BASOS PCT: 0 %
Eosinophils Absolute: 0 10*3/uL (ref 0.0–0.7)
Eosinophils Relative: 0 %
HEMATOCRIT: 37 % (ref 36.0–46.0)
HEMOGLOBIN: 11.8 g/dL — AB (ref 12.0–15.0)
Lymphocytes Relative: 14 %
Lymphs Abs: 0.5 10*3/uL — ABNORMAL LOW (ref 0.7–4.0)
MCH: 24.3 pg — ABNORMAL LOW (ref 26.0–34.0)
MCHC: 31.9 g/dL (ref 30.0–36.0)
MCV: 76.3 fL — ABNORMAL LOW (ref 78.0–100.0)
MONOS PCT: 6 %
Monocytes Absolute: 0.2 10*3/uL (ref 0.1–1.0)
NEUTROS ABS: 2.9 10*3/uL (ref 1.7–7.7)
NEUTROS PCT: 80 %
Platelets: 136 10*3/uL — ABNORMAL LOW (ref 150–400)
RBC: 4.85 MIL/uL (ref 3.87–5.11)
RDW: 16.4 % — ABNORMAL HIGH (ref 11.5–15.5)
WBC: 3.6 10*3/uL — AB (ref 4.0–10.5)

## 2017-09-15 LAB — BASIC METABOLIC PANEL
ANION GAP: 12 (ref 5–15)
BUN: 14 mg/dL (ref 6–20)
CALCIUM: 9.5 mg/dL (ref 8.9–10.3)
CO2: 27 mmol/L (ref 22–32)
Chloride: 103 mmol/L (ref 101–111)
Creatinine, Ser: 0.72 mg/dL (ref 0.44–1.00)
GFR calc Af Amer: >60 mL/min (ref >60)
GFR calc non Af Amer: >60 mL/min (ref >60)
Glucose, Bld: 183 mg/dL — ABNORMAL HIGH (ref 65–99)
Potassium: 4.3 mmol/L (ref 3.5–5.1)
SODIUM: 142 mmol/L (ref 135–145)

## 2017-09-15 LAB — PROTIME-INR
INR: 1.05
PROTHROMBIN TIME: 13.6 s (ref 11.4–15.2)

## 2017-09-15 LAB — GLUCOSE, CAPILLARY: Glucose-Capillary: 176 mg/dL — ABNORMAL HIGH (ref 65–99)

## 2017-09-15 MED ORDER — FENTANYL CITRATE (PF) 100 MCG/2ML IJ SOLN
INTRAMUSCULAR | Status: AC
  Filled 2017-09-15: qty 2

## 2017-09-15 MED ORDER — FENTANYL CITRATE (PF) 100 MCG/2ML IJ SOLN
INTRAMUSCULAR | Status: AC | PRN
  Administered 2017-09-15 (×2): 50 ug via INTRAVENOUS

## 2017-09-15 MED ORDER — SODIUM CHLORIDE 0.9 % IV SOLN
INTRAVENOUS | Status: DC
  Administered 2017-09-15: 07:00:00 via INTRAVENOUS

## 2017-09-15 MED ORDER — HYDROCODONE-ACETAMINOPHEN 5-325 MG PO TABS: 1.0000 | ORAL_TABLET | ORAL | Status: DC | PRN

## 2017-09-15 MED ORDER — LIDOCAINE HCL (PF) 1 % IJ SOLN
INTRAMUSCULAR | Status: AC | PRN
  Administered 2017-09-15: 20 mL

## 2017-09-15 MED ORDER — MIDAZOLAM HCL 2 MG/2ML IJ SOLN
INTRAMUSCULAR | Status: AC
  Filled 2017-09-15: qty 2

## 2017-09-15 MED ORDER — MIDAZOLAM HCL 2 MG/2ML IJ SOLN
INTRAMUSCULAR | Status: AC | PRN
  Administered 2017-09-15 (×2): 1 mg via INTRAVENOUS

## 2017-09-15 NOTE — Discharge Instructions (Signed)
Moderate Conscious Sedation, Adult, Care After °These instructions provide you with information about caring for yourself after your procedure. Your health care provider may also give you more specific instructions. Your treatment has been planned according to current medical practices, but problems sometimes occur. Call your health care provider if you have any problems or questions after your procedure. °What can I expect after the procedure? °After your procedure, it is common: °· To feel sleepy for several hours. °· To feel clumsy and have poor balance for several hours. °· To have poor judgment for several hours. °· To vomit if you eat too soon. ° °Follow these instructions at home: °For at least 24 hours after the procedure: ° °· Do not: °? Participate in activities where you could fall or become injured. °? Drive. °? Use heavy machinery. °? Drink alcohol. °? Take sleeping pills or medicines that cause drowsiness. °? Make important decisions or sign legal documents. °? Take care of children on your own. °· Rest. °Eating and drinking °· Follow the diet recommended by your health care provider. °· If you vomit: °? Drink water, juice, or soup when you can drink without vomiting. °? Make sure you have little or no nausea before eating solid foods. °General instructions °· Have a responsible adult stay with you until you are awake and alert. °· Take over-the-counter and prescription medicines only as told by your health care provider. °· If you smoke, do not smoke without supervision. °· Keep all follow-up visits as told by your health care provider. This is important. °Contact a health care provider if: °· You keep feeling nauseous or you keep vomiting. °· You feel light-headed. °· You develop a rash. °· You have a fever. °Get help right away if: °· You have trouble breathing. °This information is not intended to replace advice given to you by your health care provider. Make sure you discuss any questions you have  with your health care provider. °Document Released: 08/11/2013 Document Revised: 03/25/2016 Document Reviewed: 02/10/2016 °Elsevier Interactive Patient Education © 2018 [employer] ° ° °Bone Marrow Aspiration and Bone Marrow Biopsy, Adult, Care After °This sheet gives you information about how to care for yourself after your procedure. Your health care provider may also give you more specific instructions. If you have problems or questions, contact your health care provider. °What can I expect after the procedure? °After the procedure, it is common to have: °· Mild pain and tenderness. °· Swelling. °· Bruising. ° °Follow these instructions at home: °· Take over-the-counter or prescription medicines only as told by your health care provider. °· Do not take baths, swim, or use a hot tub until your health care provider approves. Ask if you can take a shower or have a sponge bath.  You may shower tomorrow. °· Follow instructions from your health care provider about how to take care of the puncture site. Make sure you: °? Wash your hands with soap and water before you change your bandage (dressing). If soap and water are not available, use hand sanitizer. °? Change your dressing as told by your health care provider.  You may remove your dressing tomorrow. °· Check your puncture site every day for signs of infection. Check for: °? More redness, swelling, or pain. °? More fluid or blood. °? Warmth. °? Pus or a bad smell. °· Return to your normal activities as told by your health care provider. Ask your health care provider what activities are safe for you. °· Do not drive   for 24 hours if you were given a medicine to help you relax (sedative). °· Keep all follow-up visits as told by your health care provider. This is important. °Contact a health care provider if: °· You have more redness, swelling, or pain around the puncture site. °· You have more fluid or blood coming from the puncture site. °· Your puncture site feels  warm to the touch. °· You have pus or a bad smell coming from the puncture site. °· You have a fever. °· Your pain is not controlled with medicine. °This information is not intended to replace advice given to you by your health care provider. Make sure you discuss any questions you have with your health care provider. °Document Released: 05/10/2005 Document Revised: 05/10/2016 Document Reviewed: 04/03/2016 °Elsevier Interactive Patient Education © 2018 [employer] ° °

## 2017-09-15 NOTE — Consult Note (Signed)
Chief Complaint: Katherine Bell was seen in consultation today for CT-guided bone marrow biopsy  Referring Physician(s): Perlov,Mikhail G  Supervising Physician: Markus Daft  Katherine Bell Status: [facility] - Out-pt  History of Present Illness: Katherine Bell is a 66 y.o. female with history of pancytopenia and splenomegaly of unknown etiology who presents today for CT-guided bone marrow biopsy for further evaluation.  Additional medical history as below. Past Medical History:  Diagnosis Date  . Acute blood loss as cause of postoperative anemia 08/19/2017  . Anemia of chronic disease 08/19/2017  . Anxiety   . Corneal ulcer, left   . Diabetes mellitus without complication ([place])   . GERD (gastroesophageal reflux disease)   . History of kidney stones   . Hypercholesteremia   . Hypertension   . Hypothyroidism   . Osteoarthritis of left knee   . Primary localized osteoarthrosis of the knee, left 08/06/2017  . Sleep apnea, obstructive    used for a year -off for a year    Past Surgical History:  Procedure Laterality Date  . ABDOMINAL HYSTERECTOMY  1996  . BLADDER SUSPENSION  1997   4 different procedures  . CATARACT EXTRACTION W/ INTRAOCULAR LENS IMPLANT Left 2004  . DACROCYSTORHINOSTOMY W/ JONES TUBE Left 2004   for tear duct  . EYE SURGERY      Allergies: Penicillins and Sulfa antibiotics  Medications: Prior to Admission medications   Medication Sig Start Date End Date Taking? Authorizing Provider  acetaminophen (TYLENOL) 325 MG tablet Take 2 tablets (650 mg total) by mouth every 6 (six) hours as needed for mild pain (or Fever >/= 101). 08/19/17  Yes Shepperson, Kirstin, PA-C  acyclovir (ZOVIRAX) 400 MG tablet Take 400 mg by mouth 2 (two) times daily.   Yes [provider]  atorvastatin (LIPITOR) 40 MG tablet Take 20 mg by mouth daily.    Yes [provider]  Calcium Carbonate (CALCIUM 600 PO) Take 1,200 mg by mouth daily.    Yes [provider]  glipiZIDE  (GLUCOTROL) 5 MG tablet Take 5 mg by mouth 2 (two) times daily before a meal.   Yes [provider]  levothyroxine (SYNTHROID, LEVOTHROID) 100 MCG tablet Take 100 mcg by mouth daily before breakfast.   Yes [provider]  lisinopril (PRINIVIL,ZESTRIL) 5 MG tablet Take 5 mg by mouth daily.    Yes [provider]  losartan (COZAAR) 25 MG tablet Take 25 mg by mouth daily.    Yes [provider]  metFORMIN (GLUCOPHAGE) 1000 MG tablet Take 1,000 mg by mouth 2 (two) times daily with a meal.   Yes [provider]  Multiple Vitamins-Minerals (CENTRUM SILVER ULTRA WOMENS PO) Take 0.5 tablets by mouth 2 (two) times daily.    Yes [provider]  oxyCODONE (OXY IR/ROXICODONE) 5 MG immediate release tablet Take 1-2 tablets (5-10 mg total) by mouth every 3 (three) hours as needed for breakthrough pain. 08/19/17  Yes Shepperson, Kirstin, PA-C  pantoprazole (PROTONIX) 40 MG tablet Take 40 mg by mouth 2 (two) times daily.   Yes [provider]  polyethylene glycol (MIRALAX / GLYCOLAX) packet 17grams in 6 oz of water twice a day until bowel movement.  LAXITIVE.  Restart if two days since last bowel movement 08/19/17  Yes Shepperson, Kirstin, PA-C  sertraline (ZOLOFT) 100 MG tablet Take 100 mg by mouth 2 (two) times daily.   Yes [provider]  VOLTAREN 1 % GEL Apply 1 application topically 3 (three) times daily. 07/10/17  Yes [provider]  cephALEXin (KEFLEX) 500 MG capsule Take 1 capsule (500 mg total) by mouth 4 (four) times daily. 08/26/17   Shepperson, Kirstin, PA-C  docusate sodium (COLACE) 100 MG capsule 1 tab 2 times a day while on narcotics.  STOOL SOFTENER 08/19/17   Shepperson, Kirstin, PA-C  fexofenadine (ALLEGRA) 180 MG tablet Take 180 mg by mouth daily as needed for allergies.     [provider]     Family History  Problem Relation Age of Onset  . Suicidality Father   . Diabetes Brother   . Hypertension  Brother   . Heart disease Brother     Social History   Socioeconomic History  . Marital status: Married    Spouse name: None  . Number of children: None  . Years of education: None  . Highest education level: None  Social Needs  . Financial resource strain: None  . Food insecurity - worry: None  . Food insecurity - inability: None  . Transportation needs - medical: None  . Transportation needs - non-medical: None  Occupational History  . None  Tobacco Use  . Smoking status: Never Smoker  . Smokeless tobacco: Never Used  Substance and Sexual Activity  . Alcohol use: No  . Drug use: No  . Sexual activity: None  Other Topics Concern  . None  Social History Narrative  . None      Review of Systems currently denies fever, headache, chest pain, dyspnea, cough, abdominal/back pain, nausea, vomiting or bleeding  Vital Signs: BP 129/77 (BP Location: Right Arm)   Pulse (!) 104   Temp 98.5 F (36.9 C) (Oral)   Resp 18   SpO2 98%   Physical Exam awake, alert.  Chest clear to auscultation bilaterally.  Heart was slightly tachycardic but regular rhythm.  Abdomen soft, positive bowel sounds, nontender, splenomegaly.  Mild left lower extremity edema, no right lower extremity edema.  Imaging: Dg Chest 2 View  Result Date: 08/20/2017 CLINICAL DATA:  Fever EXAM: CHEST  2 VIEW COMPARISON:  None. FINDINGS: The heart size and mediastinal contours are within normal limits. Both lungs are clear. The visualized skeletal structures are unremarkable. IMPRESSION: No active cardiopulmonary disease. Electronically Signed   By: Franchot Gallo M.D.   On: 08/20/2017 18:45   Ct Angio Chest Pe W Or Wo Contrast  Result Date: 08/22/2017 CLINICAL DATA:  Fever and shortness of breath EXAM: CT ANGIOGRAPHY CHEST WITH CONTRAST TECHNIQUE: Multidetector CT imaging of the chest was performed using the standard protocol during bolus administration of intravenous contrast. Multiplanar CT image  reconstructions and MIPs were obtained to evaluate the vascular anatomy. CONTRAST:  100 mL Isovue 370 nonionic COMPARISON:  August 20, 2017 FINDINGS: Cardiovascular: There is no demonstrable pulmonary embolus. There is no thoracic aortic aneurysm or dissection. There is atherosclerotic calcification in the proximal left subclavian artery. Visualized great vessels otherwise appear unremarkable. There is atherosclerotic calcification in the aorta as well as foci of coronary artery calcification. Pericardium is not appreciably thickened. Mediastinum/Nodes: Thyroid is not visualized. There are subcentimeter mediastinal lymph nodes without adenopathy by size criteria. No esophageal lesions are evident. Lungs/Pleura: There is no edema or consolidation. No pleural effusion or pleural thickening evident. Upper Abdomen: Spleen is incompletely visualized but noted to be enlarged. From anterior to posterior dimension, spleen measures 18.7 cm in length. Visualized upper abdominal structures otherwise appear unremarkable. Musculoskeletal: There is degenerative change in the thoracic spine with a degree of diffuse idiopathic skeletal hyperostosis. There  are no evident blastic or lytic bone lesions. Review of the MIP images confirms the above findings. IMPRESSION: 1.  No demonstrable pulmonary embolus. 2.  Splenomegaly, incompletely visualized. 3.  No lung edema or consolidation. 4.  No evident adenopathy. 5. Aortic atherosclerosis. Great vessel calcification as well as foci of coronary artery calcification. Aortic Atherosclerosis (ICD10-I70.0). Electronically Signed   By: Lowella Grip III M.D.   On: 08/22/2017 14:06   US Abdomen Complete  Result Date: 08/24/2017 CLINICAL DATA:  Hepatosplenomegaly. EXAM: ABDOMEN ULTRASOUND COMPLETE COMPARISON:  CT 08/22/2017 of the chest. FINDINGS: Gallbladder: Nonobstructing layering calculi are noted within a physiologically distended gallbladder. No wall thickening. Single wall  thickness is 1.6 mm. The largest stone is approximately 8 mm. No sonographic Murphy sign noted by sonographer. Common bile duct: Diameter: Normal at 4.5 mm Liver: No focal lesion identified. Increased parenchymal echogenicity. Portal vein is patent on color Doppler imaging with normal direction of blood flow towards the liver. IVC: No abnormality visualized. Pancreas: Visualized portion unremarkable. Spleen: Enlarged spleen measuring 19.8 x 20.9 x 11.3 cm (volume = 2450 cm^3). No space-occupying mass seen. Right Kidney: Length: 11.2 cm. Echogenicity within normal limits. No mass or hydronephrosis visualized. Left Kidney: Length: 10.9 cm. Echogenicity within normal limits. No mass or hydronephrosis visualized. Abdominal aorta: No aneurysm visualized. The distal aorta and bifurcation were obscured by overlying bowel. Maximum caliber 2.4 cm of the aorta. Other findings: None. IMPRESSION: 1. Splenomegaly with the spleen measuring 19.8 x 20.9 x 11.3 cm. No focal mass. 2. Increased echogenicity of the liver consistent with fatty infiltration. No discrete mass. No definite hepatomegaly. 3. Uncomplicated cholelithiasis. Electronically Signed   By: Ashley Royalty M.D.   On: 08/24/2017 23:09    Labs:  CBC: Recent Labs    08/25/17 1018 08/26/17 0614 09/03/17 1217 09/15/17 0717  WBC 2.7* 2.5* 8.3 3.6*  HGB 9.9* 9.0* 13.6 11.8*  HCT 31.7* 28.0* 41.4 37.0  PLT 120* 111* 234 136*    COAGS: No results for input(s): INR, APTT in the last 8760 hours.  BMP: Recent Labs    08/24/17 0625 08/25/17 1018 08/26/17 0942 09/03/17 1217 09/15/17 0717  NA 136 134* 135 134* 142  K 4.8 4.4 4.0 3.8 4.3  CL 100* 97* 97*  --  103  CO2 '30 28 29 23 27  ' GLUCOSE 207* 240* 258* 259* 183*  BUN '9 7 6 ' 23.6 14  CALCIUM 8.3* 9.0 8.9 10.7* 9.5  CREATININE 0.80 0.71 0.76 1.0 0.72  GFRNONAA >60 >60 >60  --  >60  GFRAA >60 >60 >60  --  >60    LIVER FUNCTION TESTS: Recent Labs    08/24/17 0625 08/25/17 1018 08/26/17 0942  09/03/17 1217  BILITOT 1.1 1.5* 1.6* 0.73  AST 37 '27 20 21  ' ALT 34 '30 23 19  ' ALKPHOS 103 107 107 132  PROT 5.4* 6.0* 5.8* 8.7*  ALBUMIN 2.8* 3.3* 3.2* 4.5    TUMOR MARKERS: No results for input(s): AFPTM, CEA, CA199, CHROMGRNA in the last 8760 hours.  Katherine Bell: 66 y.o. female with history of pancytopenia and splenomegaly of unknown etiology who presents today for CT-guided bone marrow biopsy for further evaluation. Risks and benefits discussed with the Katherine Bell /spouse including, but not limited to bleeding, infection, damage to adjacent structures or low yield requiring additional tests.All of the Katherine Bell's questions were answered, Katherine Bell is agreeable to proceed.Consent signed and in chart.     Katherine Bell.  I greatly enjoyed  meeting Tenna Child Boutelle and look forward to participating in their care.  A copy of this report was sent to the requesting provider on this date.   Approximately 20 minutes were spent face-to-face with Katherine Bell discussing details of above procedure.  Electronically Signed: D. Rowe Robert, PA-C 09/15/2017, 8:20 AM

## 2017-09-15 NOTE — Procedures (Signed)
CT guided bone marrow biopsy.  2 aspirates and 1 core.  No immediate complication.  Minimal blood loss.

## 2017-09-16 ENCOUNTER — Telehealth: Payer: Self-pay | Admitting: Hematology and Oncology

## 2017-09-16 ENCOUNTER — Telehealth: Payer: Self-pay

## 2017-09-16 DIAGNOSIS — M1712 Unilateral primary osteoarthritis, left knee: Secondary | ICD-10-CM | POA: Diagnosis not present

## 2017-09-16 NOTE — Telephone Encounter (Signed)
Rescheduled appt per 11/13 sch msg - left message for patient regarding appt.

## 2017-09-16 NOTE — Telephone Encounter (Signed)
Spoke to patient and she is aware of appointment on Friday November 23rd, at 9:00.

## 2017-09-17 ENCOUNTER — Ambulatory Visit: Payer: Medicare Other | Admitting: Hematology and Oncology

## 2017-09-22 DIAGNOSIS — M1712 Unilateral primary osteoarthritis, left knee: Secondary | ICD-10-CM | POA: Diagnosis not present

## 2017-09-23 DIAGNOSIS — M1712 Unilateral primary osteoarthritis, left knee: Secondary | ICD-10-CM | POA: Diagnosis not present

## 2017-09-24 DIAGNOSIS — I10 Essential (primary) hypertension: Secondary | ICD-10-CM | POA: Diagnosis not present

## 2017-09-24 DIAGNOSIS — M1712 Unilateral primary osteoarthritis, left knee: Secondary | ICD-10-CM | POA: Diagnosis not present

## 2017-09-24 DIAGNOSIS — E7800 Pure hypercholesterolemia, unspecified: Secondary | ICD-10-CM | POA: Diagnosis not present

## 2017-09-24 DIAGNOSIS — E119 Type 2 diabetes mellitus without complications: Secondary | ICD-10-CM | POA: Diagnosis not present

## 2017-09-26 ENCOUNTER — Ambulatory Visit (HOSPITAL_BASED_OUTPATIENT_CLINIC_OR_DEPARTMENT_OTHER): Payer: Medicare Other

## 2017-09-26 ENCOUNTER — Ambulatory Visit (HOSPITAL_BASED_OUTPATIENT_CLINIC_OR_DEPARTMENT_OTHER): Payer: Medicare Other | Admitting: Hematology and Oncology

## 2017-09-26 ENCOUNTER — Telehealth: Payer: Self-pay | Admitting: Hematology and Oncology

## 2017-09-26 ENCOUNTER — Encounter: Payer: Self-pay | Admitting: Hematology and Oncology

## 2017-09-26 VITALS — BP 148/75 | HR 110 | Temp 98.7°F | Resp 18 | Ht 67.0 in | Wt 178.5 lb

## 2017-09-26 DIAGNOSIS — D61818 Other pancytopenia: Secondary | ICD-10-CM | POA: Diagnosis not present

## 2017-09-26 DIAGNOSIS — D509 Iron deficiency anemia, unspecified: Secondary | ICD-10-CM | POA: Diagnosis present

## 2017-09-26 DIAGNOSIS — R195 Other fecal abnormalities: Secondary | ICD-10-CM

## 2017-09-26 DIAGNOSIS — R161 Splenomegaly, not elsewhere classified: Secondary | ICD-10-CM

## 2017-09-26 LAB — CBC WITH DIFFERENTIAL/PLATELET
BASO%: 0.2 % (ref 0.0–2.0)
Basophils Absolute: 0 10*3/uL (ref 0.0–0.1)
EOS%: 0.6 % (ref 0.0–7.0)
Eosinophils Absolute: 0 10*3/uL (ref 0.0–0.5)
HEMATOCRIT: 40 % (ref 34.8–46.6)
HGB: 12.9 g/dL (ref 11.6–15.9)
LYMPH#: 0.7 10*3/uL — AB (ref 0.9–3.3)
LYMPH%: 14.5 % (ref 14.0–49.7)
MCH: 23.9 pg — ABNORMAL LOW (ref 25.1–34.0)
MCHC: 32.2 g/dL (ref 31.5–36.0)
MCV: 74.2 fL — ABNORMAL LOW (ref 79.5–101.0)
MONO#: 0.3 10*3/uL (ref 0.1–0.9)
MONO%: 4.9 % (ref 0.0–14.0)
NEUT%: 79.8 % — AB (ref 38.4–76.8)
NEUTROS ABS: 4.1 10*3/uL (ref 1.5–6.5)
Platelets: 137 10*3/uL — ABNORMAL LOW (ref 145–400)
RBC: 5.39 10*6/uL (ref 3.70–5.45)
RDW: 18 % — AB (ref 11.2–14.5)
WBC: 5.1 10*3/uL (ref 3.9–10.3)

## 2017-09-26 LAB — COMPREHENSIVE METABOLIC PANEL
ALT: 19 U/L (ref 0–55)
AST: 22 U/L (ref 5–34)
Albumin: 4.3 g/dL (ref 3.5–5.0)
Alkaline Phosphatase: 103 U/L (ref 40–150)
Anion Gap: 11 mEq/L (ref 3–11)
BUN: 12.3 mg/dL (ref 7.0–26.0)
CALCIUM: 10 mg/dL (ref 8.4–10.4)
CHLORIDE: 101 meq/L (ref 98–109)
CO2: 26 meq/L (ref 22–29)
CREATININE: 0.9 mg/dL (ref 0.6–1.1)
EGFR: >60 mL/min/{1.73_m2} (ref >60)
Glucose: 184 mg/dl — ABNORMAL HIGH (ref 70–140)
Potassium: 4.3 mEq/L (ref 3.5–5.1)
Sodium: 137 mEq/L (ref 136–145)
Total Bilirubin: 0.55 mg/dL (ref 0.20–1.20)
Total Protein: 7.6 g/dL (ref 6.4–8.3)

## 2017-09-26 NOTE — Progress Notes (Signed)
Patient given copy of lab work today. No need for additional treatment today. Pt okay to f/u with Dr. Lebron Conners in 2 months.

## 2017-09-26 NOTE — Telephone Encounter (Signed)
Gave avs and calendar for January 2019 °

## 2017-09-29 DIAGNOSIS — M1712 Unilateral primary osteoarthritis, left knee: Secondary | ICD-10-CM | POA: Diagnosis not present

## 2017-09-30 DIAGNOSIS — M1712 Unilateral primary osteoarthritis, left knee: Secondary | ICD-10-CM | POA: Diagnosis not present

## 2017-10-01 DIAGNOSIS — M1712 Unilateral primary osteoarthritis, left knee: Secondary | ICD-10-CM | POA: Diagnosis not present

## 2017-10-02 ENCOUNTER — Encounter (HOSPITAL_COMMUNITY): Payer: Self-pay

## 2017-10-02 DIAGNOSIS — M7130 Other bursal cyst, unspecified site: Secondary | ICD-10-CM | POA: Diagnosis not present

## 2017-10-02 DIAGNOSIS — X32XXXD Exposure to sunlight, subsequent encounter: Secondary | ICD-10-CM | POA: Diagnosis not present

## 2017-10-02 DIAGNOSIS — L570 Actinic keratosis: Secondary | ICD-10-CM | POA: Diagnosis not present

## 2017-10-02 DIAGNOSIS — L308 Other specified dermatitis: Secondary | ICD-10-CM | POA: Diagnosis not present

## 2017-10-02 LAB — CHROMOSOME ANALYSIS, BONE MARROW

## 2017-10-03 ENCOUNTER — Telehealth: Payer: Self-pay

## 2017-10-03 DIAGNOSIS — M1712 Unilateral primary osteoarthritis, left knee: Secondary | ICD-10-CM | POA: Diagnosis not present

## 2017-10-03 NOTE — Telephone Encounter (Signed)
Patient just called stating she has 3/10 pain on the left side of her abdomen since yesterday.  Informed patient that Dr. Lebron Conners is out of the office. Patient states pain is tolerable and she was just calling to notify MD. Instructed patient to monitor pain and if it increases or becomes worse to go to the Emergency Department. Patient verbalized understanding.

## 2017-10-06 ENCOUNTER — Ambulatory Visit: Payer: Medicare Other

## 2017-10-06 DIAGNOSIS — M1712 Unilateral primary osteoarthritis, left knee: Secondary | ICD-10-CM | POA: Diagnosis not present

## 2017-10-07 DIAGNOSIS — E119 Type 2 diabetes mellitus without complications: Secondary | ICD-10-CM | POA: Diagnosis not present

## 2017-10-07 DIAGNOSIS — I10 Essential (primary) hypertension: Secondary | ICD-10-CM | POA: Diagnosis not present

## 2017-10-07 DIAGNOSIS — E7800 Pure hypercholesterolemia, unspecified: Secondary | ICD-10-CM | POA: Diagnosis not present

## 2017-10-08 DIAGNOSIS — M1712 Unilateral primary osteoarthritis, left knee: Secondary | ICD-10-CM | POA: Diagnosis not present

## 2017-10-12 NOTE — Progress Notes (Signed)
[name] Follow-up Visit:  Assessment: Pancytopenia Katherine Bell) 66 y.o. female referred to our clinic for splenomegaly, presenting with pancytopenia with lymphopenia, moderate microcytic hypochromic anemia, and mild thrombocytopenia.  Spleen is significantly enlarged, there is also abnormal appearance of the liver with increased echogenicity of the liver parenchyma in the context of mild elevation of bilirubin, normal transaminases, and history of acholic stools while imaging demonstrates presence of cholelithiasis no obvious signs of pancreatitis.  Splenomegaly: No portal hypertension note to justify splenomegaly by hepatic abnormalities.  Differential includes lymphoproliferative process versus reactive splenomegaly due to infection.  Based on the findings, infectious mononucleosis is the most likely cause.  Pancytopenia: Patient has lymphopenia consistent with either a lymphoproliferative, myeloproliferative, or infection process.  No lymphoproliferative, or myeloproliferative process identified on the valuation. Most likely, bone marrow changes were due to infection and appear to be improving based on most recent evaluation.  Acholic stools, nausea, vomiting, diarrhea: Constellation of cholelithiasis, elevated bilirubin, acholic stools are consistent with likely passage of gallstones with intermittent obstruction.  No active cholecystitis noted at this time.    Plan: --continue hematological monitoring to assure further improvements in blood counts -- return to clinic in two months with labs. Voice recognition software was used and creation of this note. Despite my best effort at editing the text, some misspelling/errors may have occurred.   Orders Placed This Encounter  Procedures  . Comprehensive metabolic panel    Standing Status:   Future    Number of Occurrences:   1    Standing Expiration Date:   09/26/2018  . CBC with Differential    Standing Status:   Future     Number of Occurrences:   1    Standing Expiration Date:   09/26/2018  . Fecal, Occult Blood    Standing Status:   Future    Standing Expiration Date:   09/26/2018  . CBC with Differential    Standing Status:   Future    Standing Expiration Date:   09/26/2018  . Comprehensive metabolic panel    Standing Status:   Future    Standing Expiration Date:   09/26/2018  . Lactate dehydrogenase (LDH)    Standing Status:   Future    Standing Expiration Date:   09/26/2018  . Ferritin    Standing Status:   Future    Standing Expiration Date:   09/26/2018  . Iron and TIBC    Standing Status:   Future    Standing Expiration Date:   09/26/2018    Cancer Staging No matching staging information was found for the patient.  All questions were answered.  . The patient knows to call the clinic with any problems, questions or concerns.  This note was electronically signed.    History of Presenting Illness Katherine Bell is a 66 y.o. female followed in the [facility] for evaluation of splenomegaly and associated Lymphopenia, thrombocytopenia, and macrocytic anemia, referred by Dr Flint Melter.  See results of the lab work and imaging in the oncological/hematological history.  Patient reports feeling well until undergoing total knee replacement surgery.  Preoperative labs demonstrated anemia, nevertheless patient proceeded with the surgery with 3 units of packed red blood cells transfusion.  Postoperative period was complicated by febrile illness.  Since then, patient has been having intermittent nausea, upset stomach, and intermittent vomiting.  She also complains of diarrhea without melena, but with very lightly colored stool, often white in color.  Patient reports nocturnal sweats that  are soaking since she has had her surgery.  Over the past 2 weeks she reports losing about 15 pounds in weight.  Since the last visit to the clinic, Patient continues to have diarrhea which she describes as 2 diarrheal  stools per week with normal bowel movements and the rest of the day. Continues to have nausea with Food and has stopped having night sweats. No other significant new complaints.    Oncological/hematological History: --CTA Chest, 08/22/17: No evidence of pulmonary embolism, no mediastinal or hilar lymphadenopathy.  Suspecting splenomegaly with spleen measuring at least 18.7 cm. --US Abdomen, 08/24/17: Increased parenchymal echogenicity in the liver with normal portal flow.  Gallstones in the gallbladder.  Spleen measuring 19.8 x 20.9 x 11.3 cm. --Labs, 08/26/17: WBC 2.5, ANC 2.0, ALC 0.3, Mono 0.2, Hgb   9.0, MCV 77.6, MCH 24.9, RDW 15.9, Plt 111; tBili 1.6, tProt 5.6, Alb 3.2, AP 107, AST 20, ALT 23 --Labs, 09/15/17: WBC 3.6,                                                Hgb 11.8,                                                        Plt 136; Patient tested positive for acute/reactivated CMV/EBV infection based on IgM presence for both. Flow cytometry -- peripheral blood without evidence of monoclonal lymphoproliferative process. --BM Bx, 09/15/17: Hypercellular the bone marrow with triliear hyperproliferation and no significant signs of atypia to suggest MDS/leukemia/MPN.    Medical History: Past Medical History:  Diagnosis Date  . Acute blood loss as cause of postoperative anemia 08/19/2017  . Anemia of chronic disease 08/19/2017  . Anxiety   . Corneal ulcer, left   . Diabetes mellitus without complication ([place])   . GERD (gastroesophageal reflux disease)   . History of kidney stones   . Hypercholesteremia   . Hypertension   . Hypothyroidism   . Osteoarthritis of left knee   . Primary localized osteoarthrosis of the knee, left 08/06/2017  . Sleep apnea, obstructive    used for a year -off for a year    Surgical History: Past Surgical History:  Procedure Laterality Date  . ABDOMINAL HYSTERECTOMY  1996  . BLADDER SUSPENSION  1997   4 different procedures  . CATARACT EXTRACTION  W/ INTRAOCULAR LENS IMPLANT Left 2004  . DACROCYSTORHINOSTOMY W/ JONES TUBE Left 2004   for tear duct  . EYE SURGERY    . TOTAL KNEE ARTHROPLASTY Left 08/18/2017   Procedure: LEFT TOTAL KNEE ARTHROPLASTY;  Surgeon: Elsie Saas, MD;  Location: [facility];  Service: Orthopedics;  Laterality: Left;    Family History: Family History  Problem Relation Age of Onset  . Suicidality Father   . Diabetes Brother   . Hypertension Brother   . Heart disease Brother     Social History: Social History   Socioeconomic History  . Marital status: Married    Spouse name: Not on file  . Number of children: Not on file  . Years of education: Not on file  . Highest education level: Not on file  Social Needs  . Financial resource strain: Not on  file  . Food insecurity - worry: Not on file  . Food insecurity - inability: Not on file  . Transportation needs - medical: Not on file  . Transportation needs - non-medical: Not on file  Occupational History  . Not on file  Tobacco Use  . Smoking status: Never Smoker  . Smokeless tobacco: Never Used  Substance and Sexual Activity  . Alcohol use: No  . Drug use: No  . Sexual activity: Not on file  Other Topics Concern  . Not on file  Social History Narrative  . Not on file    Allergies: Allergies  Allergen Reactions  . Penicillins Hives    Has taken keflex without difficulty       . Sulfa Antibiotics Hives    Medications:  Current Outpatient Medications  Medication Sig Dispense Refill  . acetaminophen (TYLENOL) 325 MG tablet Take 2 tablets (650 mg total) by mouth every 6 (six) hours as needed for mild pain (or Fever >/= 101).    Marland Kitchen acyclovir (ZOVIRAX) 400 MG tablet Take 400 mg by mouth 2 (two) times daily.    Marland Kitchen atorvastatin (LIPITOR) 40 MG tablet Take 20 mg by mouth daily.     . Calcium Carbonate (CALCIUM 600 PO) Take 1,200 mg by mouth daily.     . fexofenadine (ALLEGRA) 180 MG tablet Take 180 mg by mouth daily as needed for allergies.      Marland Kitchen glipiZIDE (GLUCOTROL) 5 MG tablet Take 5 mg by mouth 2 (two) times daily before a meal.    . levothyroxine (SYNTHROID, LEVOTHROID) 100 MCG tablet Take 100 mcg by mouth daily before breakfast.    . lisinopril (PRINIVIL,ZESTRIL) 5 MG tablet Take 5 mg by mouth daily.     Marland Kitchen losartan (COZAAR) 25 MG tablet Take 25 mg by mouth daily.     . metFORMIN (GLUCOPHAGE) 1000 MG tablet Take 1,000 mg by mouth 2 (two) times daily with a meal.    . oxyCODONE (OXY IR/ROXICODONE) 5 MG immediate release tablet Take 1-2 tablets (5-10 mg total) by mouth every 3 (three) hours as needed for breakthrough pain. 30 tablet 0  . pantoprazole (PROTONIX) 40 MG tablet Take 40 mg by mouth 2 (two) times daily.    . polyethylene glycol (MIRALAX / GLYCOLAX) packet 17grams in 6 oz of water twice a day until bowel movement.  LAXITIVE.  Restart if two days since last bowel movement 14 each 0  . sertraline (ZOLOFT) 100 MG tablet Take 100 mg by mouth 2 (two) times daily.    . Multiple Vitamins-Minerals (CENTRUM SILVER ULTRA WOMENS PO) Take 0.5 tablets by mouth 2 (two) times daily.      No current facility-administered medications for this visit.     Review of Systems: Review of Systems  Constitutional: Positive for diaphoresis and unexpected weight change.  Gastrointestinal: Positive for abdominal pain, diarrhea, nausea and vomiting. Negative for blood in stool.  All other systems reviewed and are negative.    PHYSICAL EXAMINATION Blood pressure (!) 148/75, pulse (!) 110, temperature 98.7 F (37.1 C), temperature source Oral, resp. rate 18, height _0  (1.702 m), weight 178 lb 8 oz (81 kg), SpO2 95 %.  ECOG PERFORMANCE STATUS: 1 - Symptomatic but completely ambulatory  Physical Exam  Constitutional: She is oriented to person, place, and time and well-developed, well-nourished, and in no distress. No distress.  HENT:  Head: Normocephalic and atraumatic.  Mouth/Throat: Oropharynx is clear and moist. No oropharyngeal  exudate.  Eyes: Conjunctivae and  EOM are normal. Pupils are equal, round, and reactive to light. No scleral icterus.  Neck: No thyromegaly present.  Cardiovascular: Normal rate, regular rhythm and normal heart sounds. Exam reveals no gallop.  No murmur heard. Pulmonary/Chest: Effort normal and breath sounds normal. No respiratory distress. She has no wheezes. She has no rales.  Abdominal: Soft. Bowel sounds are normal. She exhibits no distension and no mass. There is no tenderness.  Musculoskeletal: Normal range of motion. She exhibits no edema.  Lymphadenopathy:    She has no cervical adenopathy.  Neurological: She is alert and oriented to person, place, and time. She has normal reflexes. No cranial nerve deficit.  Skin: Skin is warm and dry. No rash noted. She is not diaphoretic. No erythema. No pallor.     LABORATORY DATA: I have personally reviewed the data as listed: Appointment on 09/26/2017  Component Date Value Ref Range Status  . WBC 09/26/2017 5.1  3.9 - 10.3 10e3/uL Final  . NEUT# 09/26/2017 4.1  1.5 - 6.5 10e3/uL Final  . HGB 09/26/2017 12.9  11.6 - 15.9 g/dL Final  . HCT 09/26/2017 40.0  34.8 - 46.6 % Final  . Platelets 09/26/2017 137* 145 - 400 10e3/uL Final  . MCV 09/26/2017 74.2* 79.5 - 101.0 fL Final  . MCH 09/26/2017 23.9* 25.1 - 34.0 pg Final  . MCHC 09/26/2017 32.2  31.5 - 36.0 g/dL Final  . RBC 09/26/2017 5.39  3.70 - 5.45 10e6/uL Final  . RDW 09/26/2017 18.0* 11.2 - 14.5 % Final  . lymph# 09/26/2017 0.7* 0.9 - 3.3 10e3/uL Final  . MONO# 09/26/2017 0.3  0.1 - 0.9 10e3/uL Final  . Eosinophils Absolute 09/26/2017 0.0  0.0 - 0.5 10e3/uL Final  . Basophils Absolute 09/26/2017 0.0  0.0 - 0.1 10e3/uL Final  . NEUT% 09/26/2017 79.8* 38.4 - 76.8 % Final  . LYMPH% 09/26/2017 14.5  14.0 - 49.7 % Final  . MONO% 09/26/2017 4.9  0.0 - 14.0 % Final  . EOS% 09/26/2017 0.6  0.0 - 7.0 % Final  . BASO% 09/26/2017 0.2  0.0 - 2.0 % Final  . Sodium 09/26/2017 137  136 - 145 mEq/L  Final  . Potassium 09/26/2017 4.3  3.5 - 5.1 mEq/L Final  . Chloride 09/26/2017 101  98 - 109 mEq/L Final  . CO2 09/26/2017 26  22 - 29 mEq/L Final  . Glucose 09/26/2017 184* 70 - 140 mg/dl Final   Glucose reference range is for nonfasting patients. Fasting glucose reference range is 70- 100.  Marland Kitchen BUN 09/26/2017 12.3  7.0 - 26.0 mg/dL Final  . Creatinine 09/26/2017 0.9  0.6 - 1.1 mg/dL Final  . Total Bilirubin 09/26/2017 0.55  0.20 - 1.20 mg/dL Final  . Alkaline Phosphatase 09/26/2017 103  40 - 150 U/L Final  . AST 09/26/2017 22  5 - 34 U/L Final  . ALT 09/26/2017 19  0 - 55 U/L Final  . Total Protein 09/26/2017 7.6  6.4 - 8.3 g/dL Final  . Albumin 09/26/2017 4.3  3.5 - 5.0 g/dL Final  . Calcium 09/26/2017 10.0  8.4 - 10.4 mg/dL Final  . Anion Gap 09/26/2017 11  3 - 11 mEq/L Final  . EGFR 09/26/2017 >60  >60 ml/min/1.73 m2 Final   eGFR is calculated using the CKD-EPI Creatinine Equation (2009)       Ardath Sax, MD

## 2017-10-12 NOTE — Assessment & Plan Note (Addendum)
66 y.o. female referred to our clinic for splenomegaly, presenting with pancytopenia with lymphopenia, moderate microcytic hypochromic anemia, and mild thrombocytopenia.  Spleen is significantly enlarged, there is also abnormal appearance of the liver with increased echogenicity of the liver parenchyma in the context of mild elevation of bilirubin, normal transaminases, and history of acholic stools while imaging demonstrates presence of cholelithiasis no obvious signs of pancreatitis.  Splenomegaly: No portal hypertension note to justify splenomegaly by hepatic abnormalities.  Differential includes lymphoproliferative process versus reactive splenomegaly due to infection.  Based on the findings, infectious mononucleosis is the most likely cause.  Pancytopenia: Patient has lymphopenia consistent with either a lymphoproliferative, myeloproliferative, or infection process.  No lymphoproliferative, or myeloproliferative process identified on the valuation. Most likely, bone marrow changes were due to infection and appear to be improving based on most recent evaluation.  Acholic stools, nausea, vomiting, diarrhea: Constellation of cholelithiasis, elevated bilirubin, acholic stools are consistent with likely passage of gallstones with intermittent obstruction.  No active cholecystitis noted at this time.    Plan: --continue hematological monitoring to assure further improvements in blood counts -- return to clinic in two months with labs.

## 2017-10-15 DIAGNOSIS — M1712 Unilateral primary osteoarthritis, left knee: Secondary | ICD-10-CM | POA: Diagnosis not present

## 2017-10-16 DIAGNOSIS — M1712 Unilateral primary osteoarthritis, left knee: Secondary | ICD-10-CM | POA: Diagnosis not present

## 2017-10-17 ENCOUNTER — Telehealth: Payer: Self-pay

## 2017-10-17 ENCOUNTER — Encounter (INDEPENDENT_AMBULATORY_CARE_PROVIDER_SITE_OTHER): Payer: Self-pay

## 2017-10-17 ENCOUNTER — Other Ambulatory Visit: Payer: Self-pay

## 2017-10-17 DIAGNOSIS — R161 Splenomegaly, not elsewhere classified: Secondary | ICD-10-CM

## 2017-10-17 NOTE — Telephone Encounter (Signed)
Spoke to [place] in scheduling and she stated patient has an appointment for labs/clinic on Monday 10/20/17 at 8:30am. Call placed to patient and made her aware of appointment date and time. Patient verbalized understanding.

## 2017-10-17 NOTE — Telephone Encounter (Signed)
Received patient e-mail about complaints of intermittent vomiting and diarrhea. Called patient and she stated that after eating she has diarrhea, vomiting, and burping. Requesting to be seen sooner than scheduled appointment on 11/27/17. Reviewed with Sandi Mealy, PA. Lucianne Lei agreed to see patient. Patient stated the soonest she could come in is Thursday 10/23/17. Informed patient that with her symptoms V. Tanner, PA recommended she not wait until Thursday. Patient stated she could rearrange some things and come Monday morning. High priority scheduling message sent to schedule patient in symptom management clinic Monday morning. Informed patient that is symptoms become worse to go the Emergency Department.

## 2017-10-20 ENCOUNTER — Other Ambulatory Visit (HOSPITAL_BASED_OUTPATIENT_CLINIC_OR_DEPARTMENT_OTHER): Payer: Medicare Other

## 2017-10-20 ENCOUNTER — Ambulatory Visit (HOSPITAL_BASED_OUTPATIENT_CLINIC_OR_DEPARTMENT_OTHER): Payer: Medicare Other | Admitting: Medical

## 2017-10-20 VITALS — BP 140/77 | HR 104 | Temp 97.5°F | Resp 18 | Ht 67.0 in | Wt 180.3 lb

## 2017-10-20 DIAGNOSIS — R112 Nausea with vomiting, unspecified: Secondary | ICD-10-CM

## 2017-10-20 DIAGNOSIS — D696 Thrombocytopenia, unspecified: Secondary | ICD-10-CM | POA: Diagnosis not present

## 2017-10-20 DIAGNOSIS — D649 Anemia, unspecified: Secondary | ICD-10-CM

## 2017-10-20 DIAGNOSIS — K760 Fatty (change of) liver, not elsewhere classified: Secondary | ICD-10-CM | POA: Diagnosis not present

## 2017-10-20 DIAGNOSIS — R161 Splenomegaly, not elsewhere classified: Secondary | ICD-10-CM

## 2017-10-20 DIAGNOSIS — M1712 Unilateral primary osteoarthritis, left knee: Secondary | ICD-10-CM | POA: Diagnosis not present

## 2017-10-20 DIAGNOSIS — R195 Other fecal abnormalities: Secondary | ICD-10-CM

## 2017-10-20 LAB — COMPREHENSIVE METABOLIC PANEL
ALBUMIN: 4.6 g/dL (ref 3.5–5.0)
ALK PHOS: 118 U/L (ref 40–150)
ALT: 23 U/L (ref 0–55)
AST: 22 U/L (ref 5–34)
Anion Gap: 15 mEq/L — ABNORMAL HIGH (ref 3–11)
BUN: 14.4 mg/dL (ref 7.0–26.0)
CHLORIDE: 100 meq/L (ref 98–109)
CO2: 23 meq/L (ref 22–29)
Calcium: 9.7 mg/dL (ref 8.4–10.4)
Creatinine: 0.9 mg/dL (ref 0.6–1.1)
GLUCOSE: 196 mg/dL — AB (ref 70–140)
POTASSIUM: 4.2 meq/L (ref 3.5–5.1)
SODIUM: 137 meq/L (ref 136–145)
Total Bilirubin: 0.76 mg/dL (ref 0.20–1.20)
Total Protein: 7.7 g/dL (ref 6.4–8.3)

## 2017-10-20 LAB — CBC WITH DIFFERENTIAL/PLATELET
BASO%: 0.2 % (ref 0.0–2.0)
BASOS ABS: 0 10*3/uL (ref 0.0–0.1)
EOS ABS: 0 10*3/uL (ref 0.0–0.5)
EOS%: 0.4 % (ref 0.0–7.0)
HEMATOCRIT: 40.4 % (ref 34.8–46.6)
HGB: 13.3 g/dL (ref 11.6–15.9)
LYMPH#: 0.6 10*3/uL — AB (ref 0.9–3.3)
LYMPH%: 12.5 % — AB (ref 14.0–49.7)
MCH: 24.3 pg — ABNORMAL LOW (ref 25.1–34.0)
MCHC: 32.9 g/dL (ref 31.5–36.0)
MCV: 73.9 fL — AB (ref 79.5–101.0)
MONO#: 0.2 10*3/uL (ref 0.1–0.9)
MONO%: 4 % (ref 0.0–14.0)
NEUT#: 4.1 10*3/uL (ref 1.5–6.5)
NEUT%: 82.9 % — AB (ref 38.4–76.8)
PLATELETS: 138 10*3/uL — AB (ref 145–400)
RBC: 5.47 10*6/uL — AB (ref 3.70–5.45)
RDW: 16 % — ABNORMAL HIGH (ref 11.2–14.5)
WBC: 5 10*3/uL (ref 3.9–10.3)

## 2017-10-20 LAB — FECAL OCCULT BLOOD, GUAIAC: OCCULT BLOOD: NEGATIVE

## 2017-10-20 NOTE — Progress Notes (Signed)
Symptoms Management Clinic Progress Note   Katherine Bell [id number] 04/11/1951 66 y.o.  Katherine Bell is managed by Dr. Lebron Conners  Actively treated with chemotherapy: no  Assessment: Plan:    Splenomegaly  Fatty liver  Non-intractable vomiting with nausea, unspecified vomiting type   Splenomegaly with history of fatty liver: I have recommended to the patient that she talk to her primary care provider Dr.  Monico Blitz and asked for a referral to a gastroenterologist for further evaluation.  I suggested to the patient today that her splenomegaly may be related for her history of fatty liver disease.  The patient also has a history of ongoing diarrhea that could be evaluated by gastroenterology.  The patient would like to see a gastroenterologist in [place] if possible.  Non-intractable vomiting with nausea: I have discussed with the patient that her vomiting and nausea could be associated with gastroparesis given her history of diabetes.  She does report that she had previously been told by another gastroenterologist that "food sits in her stomach and rots".  I suggested to the patient that she may require an upper GI and small bowel follow-through study for further evaluation of possible gastroparesis.  I also discussed with her the possibility that she could be on Reglan if she were diagnosed with this condition.  She would like to talk to Dr.  Donalda Ewings personnel when she has her hemoglobin A1c completed on 10/29/2017 and asked for a referral for an upper GI and small bowel follow-through study to be completed in [place] if possible.  Please see After Visit Summary for patient specific instructions.  Future Appointments  Date Time Provider [place]  11/27/2017  9:30 AM [facility] LAB 2 [facility] None  11/27/2017 10:00 AM Ardath Sax, MD [facility] None  01/02/2018  8:00 AM [facility] VASC 4 [facility]  01/02/2018  9:00 AM Waynetta Sandy, MD [facility]    No  orders of the defined types were placed in this encounter.      Subjective:   Patient ID:  Katherine Bell is a 66 y.o. (DOB 06-May-1951) female.  Chief Complaint:  Chief Complaint  Patient presents with  . Nausea    vomiting and diarrhea    HPI Katherine Bell is a 66 year old female with a history of splenomegaly and associated lymphopenia, thrombocytopenia, and macrocytic anemia.  Her orthopedic surgeon is Dr. Flint Melter, who first noted her splenomegaly and associated lymphopenia, thrombocytopenia, and macrocytic anemia.  She was last seen by Dr. Lebron Conners on 09/26/2017.  She has a history of a total knee replacement after which time she has had intermittent nausea, upset stomach, and intermittent vomiting along with diarrhea with very light colored stools often white in color.  Our office was contacted on 10/17/2017 with the patient reporting that she continued to have intermittent vomiting, burping, and diarrhea.  This was occurring after eating. Her labs today returned with a WBC at 5.0, hemoglobin at 13.3, hematocrit 40.4, and platelet count of 139.  Neutrophils were elevated at 82.9%.  The patient's labs were consistent with her labs from 3 weeks ago. A CT scan completed on 08/24/2017 returned showing nonobstructing calculi within the gallbladder which was distended.  The patient's liver showed increased parenchymal echogenicity consistent with a fatty liver infiltration.  The patient's spleen was enlarged and measured 19.8 x 20.9 x 11.3 without any occupying masses noted.  A bone marrow biopsy was completed on 09/15/2017 with [facility] showing no observable clonal chromosomal abnormalities.  With regards to the patient's history of nausea, vomiting, and burping.  She reports that she had been seen by a gastroenterologist several years ago and [place] who told her that "food sits in her stomach and rots".  She has a history of diabetes but  denies ever being told that she had gastroparesis.  I reviewed her medication list.  She takes metformin and glipizide for her diabetes.  I do not see any evidence that she is on Reglan or another promotility agent.  Medications: I have reviewed the patient's current medications.  Allergies:  Allergies  Allergen Reactions  . Penicillins Hives    Has taken keflex without difficulty       . Sulfa Antibiotics Hives    Past Medical History:  Diagnosis Date  . Acute blood loss as cause of postoperative anemia 08/19/2017  . Anemia of chronic disease 08/19/2017  . Anxiety   . Corneal ulcer, left   . Diabetes mellitus without complication ([place])   . GERD (gastroesophageal reflux disease)   . History of kidney stones   . Hypercholesteremia   . Hypertension   . Hypothyroidism   . Osteoarthritis of left knee   . Primary localized osteoarthrosis of the knee, left 08/06/2017  . Sleep apnea, obstructive    used for a year -off for a year    Past Surgical History:  Procedure Laterality Date  . ABDOMINAL HYSTERECTOMY  1996  . BLADDER SUSPENSION  1997   4 different procedures  . CATARACT EXTRACTION W/ INTRAOCULAR LENS IMPLANT Left 2004  . DACROCYSTORHINOSTOMY W/ JONES TUBE Left 2004   for tear duct  . EYE SURGERY    . TOTAL KNEE ARTHROPLASTY Left 08/18/2017   Procedure: LEFT TOTAL KNEE ARTHROPLASTY;  Surgeon: Elsie Saas, MD;  Location: [facility];  Service: Orthopedics;  Laterality: Left;    Family History  Problem Relation Age of Onset  . Suicidality Father   . Diabetes Brother   . Hypertension Brother   . Heart disease Brother     Social History   Socioeconomic History  . Marital status: Married    Spouse name: Not on file  . Number of children: Not on file  . Years of education: Not on file  . Highest education level: Not on file  Social Needs  . Financial resource strain: Not on file  . Food insecurity - worry: Not on file  . Food insecurity - inability: Not on file  .  Transportation needs - medical: Not on file  . Transportation needs - non-medical: Not on file  Occupational History  . Not on file  Tobacco Use  . Smoking status: Never Smoker  . Smokeless tobacco: Never Used  Substance and Sexual Activity  . Alcohol use: No  . Drug use: No  . Sexual activity: Not on file  Other Topics Concern  . Not on file  Social History Narrative  . Not on file    Past Medical History, Surgical history, Social history, and Family history were reviewed and updated as appropriate.   Please see review of systems for further details on the patient's review from today.   Review of Systems:  Review of Systems  HENT: Negative for trouble swallowing.   Gastrointestinal: Positive for diarrhea, nausea and vomiting.       Burping  Genitourinary: Negative for difficulty urinating and dysuria.    Objective:   Physical Exam:  BP 140/77 (BP Location: Left  Arm, Patient Position: Sitting)   Pulse (!) 104   Temp (!) 97.5 F (36.4 C) (Oral)   Resp 18   Ht 5' 7" (1.702 m)   Wt 180 lb 4.8 oz (81.8 kg)   SpO2 100%   BMI 28.24 kg/m  ECOG: 0  Physical Exam  Constitutional: No distress.  HENT:  Head: Normocephalic and atraumatic.  Eyes: Right eye exhibits no discharge. Left eye exhibits no discharge. No scleral icterus.  Cardiovascular: Normal rate, regular rhythm and normal heart sounds. Exam reveals no gallop and no friction rub.  No murmur heard. Pulmonary/Chest: Effort normal and breath sounds normal. No respiratory distress. She has no wheezes. She has no rales.  Abdominal: Soft. Bowel sounds are normal. She exhibits no distension. There is no tenderness. There is no rebound and no guarding.    Musculoskeletal: She exhibits no edema.  Neurological: She is alert. Coordination normal.  Skin: Skin is warm and dry. She is not diaphoretic.    Lab Review:     Component Value Date/Time   NA 137 10/20/2017 0841   K 4.2 10/20/2017 0841   CL 103 09/15/2017  0717   CO2 23 10/20/2017 0841   GLUCOSE 196 (H) 10/20/2017 0841   BUN 14.4 10/20/2017 0841   CREATININE 0.9 10/20/2017 0841   CALCIUM 9.7 10/20/2017 0841   PROT 7.7 10/20/2017 0841   ALBUMIN 4.6 10/20/2017 0841   AST 22 10/20/2017 0841   ALT 23 10/20/2017 0841   ALKPHOS 118 10/20/2017 0841   BILITOT 0.76 10/20/2017 0841   GFRNONAA >60 09/15/2017 0717   GFRAA >60 09/15/2017 0717       Component Value Date/Time   WBC 5.0 10/20/2017 0840   WBC 3.6 (L) 09/15/2017 0717   RBC 5.47 (H) 10/20/2017 0840   RBC 4.85 09/15/2017 0717   HGB 13.3 10/20/2017 0840   HCT 40.4 10/20/2017 0840   PLT 138 (L) 10/20/2017 0840   MCV 73.9 (L) 10/20/2017 0840   MCH 24.3 (L) 10/20/2017 0840   MCH 24.3 (L) 09/15/2017 0717   MCHC 32.9 10/20/2017 0840   MCHC 31.9 09/15/2017 0717   RDW 16.0 (H) 10/20/2017 0840   LYMPHSABS 0.6 (L) 10/20/2017 0840   MONOABS 0.2 10/20/2017 0840   EOSABS 0.0 10/20/2017 0840   BASOSABS 0.0 10/20/2017 0840   -------------------------------  Imaging from last 24 hours (if applicable):  Radiology interpretation: No results found.      This case was discussed with Dr. Burr Medico. She expressed agreement with my management of this patient.

## 2017-10-21 DIAGNOSIS — M1712 Unilateral primary osteoarthritis, left knee: Secondary | ICD-10-CM | POA: Diagnosis not present

## 2017-10-22 DIAGNOSIS — M1712 Unilateral primary osteoarthritis, left knee: Secondary | ICD-10-CM | POA: Diagnosis not present

## 2017-10-29 DIAGNOSIS — Z6827 Body mass index (BMI) 27.0-27.9, adult: Secondary | ICD-10-CM | POA: Diagnosis not present

## 2017-10-29 DIAGNOSIS — E1165 Type 2 diabetes mellitus with hyperglycemia: Secondary | ICD-10-CM | POA: Diagnosis not present

## 2017-10-29 DIAGNOSIS — R161 Splenomegaly, not elsewhere classified: Secondary | ICD-10-CM | POA: Diagnosis not present

## 2017-10-29 DIAGNOSIS — Z299 Encounter for prophylactic measures, unspecified: Secondary | ICD-10-CM | POA: Diagnosis not present

## 2017-10-29 DIAGNOSIS — I10 Essential (primary) hypertension: Secondary | ICD-10-CM | POA: Diagnosis not present

## 2017-10-29 DIAGNOSIS — K760 Fatty (change of) liver, not elsewhere classified: Secondary | ICD-10-CM | POA: Diagnosis not present

## 2017-10-31 DIAGNOSIS — M1712 Unilateral primary osteoarthritis, left knee: Secondary | ICD-10-CM | POA: Diagnosis not present

## 2017-11-07 ENCOUNTER — Encounter (INDEPENDENT_AMBULATORY_CARE_PROVIDER_SITE_OTHER): Payer: Self-pay | Admitting: Internal Medicine

## 2017-11-10 ENCOUNTER — Encounter (INDEPENDENT_AMBULATORY_CARE_PROVIDER_SITE_OTHER): Payer: Self-pay | Admitting: Internal Medicine

## 2017-11-10 ENCOUNTER — Ambulatory Visit (INDEPENDENT_AMBULATORY_CARE_PROVIDER_SITE_OTHER): Payer: Medicare HMO | Admitting: Internal Medicine

## 2017-11-10 ENCOUNTER — Encounter (INDEPENDENT_AMBULATORY_CARE_PROVIDER_SITE_OTHER): Payer: Self-pay | Admitting: *Deleted

## 2017-11-10 ENCOUNTER — Other Ambulatory Visit (INDEPENDENT_AMBULATORY_CARE_PROVIDER_SITE_OTHER): Payer: Self-pay | Admitting: *Deleted

## 2017-11-10 VITALS — BP 140/80 | HR 98 | Temp 97.7°F | Resp 18 | Ht 67.0 in | Wt 180.2 lb

## 2017-11-10 DIAGNOSIS — K760 Fatty (change of) liver, not elsewhere classified: Secondary | ICD-10-CM

## 2017-11-10 DIAGNOSIS — R112 Nausea with vomiting, unspecified: Secondary | ICD-10-CM | POA: Insufficient documentation

## 2017-11-10 DIAGNOSIS — R161 Splenomegaly, not elsewhere classified: Secondary | ICD-10-CM | POA: Diagnosis not present

## 2017-11-10 NOTE — Patient Instructions (Signed)
Will request colonoscopy records from [facility]. Abdominopelvic CT and esophagogastroduodenoscopy to be scheduled. Please keep food and symptom diary.

## 2017-11-10 NOTE — Progress Notes (Signed)
Reason for consultation;  Nausea vomiting and weight loss. Patient with splenomegaly and thrombocytopenia.  Cirrhosis suspected.  History of present illness.:  Patient is 67 year old Caucasian female who is referred through the courtesy of Ms. Arsenio Katz PA-C/Drs. Manuella Ghazi for GI evaluation. She recalls she was told she had fatty liver about 3 years ago when she was living in [place]. She underwent left knee replacement in October 2018.  Preprocedure lab studies pertinent for pancytopenia.  Postprocedure she received 3 units of PRBCs.  She was seen by Dr. Lebron Conners of [facility] oncology and underwent extensive evaluation including bone marrow examination and no etiology was found.  She was noted to have splenomegaly on ultrasonography which also revealed fatty liver. Her iron studies suggested iron deficiency anemia but 3 Hemoccults were negative and there is no history of melena or rectal bleeding. She was suspected to have chronic liver disease and therefore referred to our office for further evaluation. She states she has been on Protonix for the past few years for GERD symptoms.  She says pantoprazole has worked very well in controlling heartburn.  She states she has been having intermittent nausea vomiting and diarrhea for about 6 months.  The symptoms been occurring on frequent if not daily basis since her knee surgery.  She feels food that she eats is like a poison because within 30 minutes or so she starts to have nausea vomiting and/or diarrhea or both.  She has not experienced emesis melena or rectal bleeding.  Her appetite is good.  Now she is afraid to eat.  She states she has lost 20 pounds.  She has not been able to pinpoint if she gets more symptoms with certain foods and less with others. She does not take OTC NSAIDs. She states she had screening colonoscopy in [place] for a 5 years ago and was within normal limits. She says her diabetes been well  controlled. Family history is negative for chronic liver disease. Family history is also negative for celiac disease.   Current Medications: Outpatient Encounter Medications as of 11/10/2017  Medication Sig  . acetaminophen (TYLENOL) 325 MG tablet Take 2 tablets (650 mg total) by mouth every 6 (six) hours as needed for mild pain (or Fever >/= 101).  Marland Kitchen acyclovir (ZOVIRAX) 400 MG tablet Take 400 mg by mouth 2 (two) times daily.  Marland Kitchen atorvastatin (LIPITOR) 40 MG tablet Take 20 mg by mouth daily.   . fexofenadine (ALLEGRA) 180 MG tablet Take 180 mg by mouth daily as needed for allergies.   Marland Kitchen glipiZIDE (GLUCOTROL) 5 MG tablet Take 5 mg by mouth 2 (two) times daily before a meal.  . hydrochlorothiazide (HYDRODIURIL) 25 MG tablet Take 25 mg by mouth daily.  Marland Kitchen levothyroxine (SYNTHROID, LEVOTHROID) 100 MCG tablet Take 100 mcg by mouth daily before breakfast.  . losartan (COZAAR) 50 MG tablet Take 50 mg by mouth daily.   . metFORMIN (GLUCOPHAGE) 1000 MG tablet Take 1,000 mg by mouth 2 (two) times daily with a meal.  . Multiple Vitamins-Minerals (VISION FORMULA PO) Take by mouth daily.  . pantoprazole (PROTONIX) 40 MG tablet Take 40 mg by mouth 2 (two) times daily.  . sertraline (ZOLOFT) 100 MG tablet Take 100 mg by mouth 2 (two) times daily.  . [DISCONTINUED] Calcium Carbonate (CALCIUM 600 PO) Take 1,200 mg by mouth daily.   . [DISCONTINUED] lisinopril (PRINIVIL,ZESTRIL) 5 MG tablet Take 5 mg by mouth daily.   . [DISCONTINUED] Multiple Vitamins-Minerals (CENTRUM SILVER ULTRA WOMENS PO) Take 0.5  tablets by mouth 2 (two) times daily.   . [DISCONTINUED] oxyCODONE (OXY IR/ROXICODONE) 5 MG immediate release tablet Take 1-2 tablets (5-10 mg total) by mouth every 3 (three) hours as needed for breakthrough pain. (Patient not taking: Reported on 10/20/2017)  . [DISCONTINUED] polyethylene glycol (MIRALAX / GLYCOLAX) packet 17grams in 6 oz of water twice a day until bowel movement.  LAXITIVE.  Restart if two days  since last bowel movement (Patient not taking: Reported on 10/20/2017)   No facility-administered encounter medications on file as of 11/10/2017.    Past medical history:  Hypothyroidism diagnosed about 40 years ago. Chronic GERD. Hypertension for about 10 years. Hyperlipidemia for about 10 years. She was diagnosed with diabetes mellitus 6 years ago. History of fatty liver. History of left corneal ulcer. History of kidney stones.  She passed 1 while back. History of anxiety. Sleep apnea. She has undergone colonoscopy for screening on 2 different occasions and no abnormality identified.  Last exam was 4-5 years ago. Osteoarthrosis of left knee.  Status post knee replacement in August 2018. Recent diagnosis of pancytopenia and hematologic workup including bone marrow was negative for neoplastic process. Surgery for right heel spur about 10 years ago and she had surgery on left foot for same but 8 years ago.  Allergies: Allergies  Allergen Reactions  . Penicillins Hives    Has taken keflex without difficulty       . Sulfa Antibiotics Hives   Family history:  Father had peptic ulcer disease and committed suicide at age 66. Mother is 68 years old and doing quite well given her age she is on no medications. She has a brother age 30 with diabetes mellitus.  Social history;  She is married and has 3 grown up children in their good health. She is retired.  She owned and managed childcare center for 40 years.  She does not smoke cigarettes or drink alcohol.   Physical examination.: Blood pressure 140/80, pulse 98, temperature 97.7 F (36.5 C), temperature source Oral, resp. rate 18, height 5\' 7"  (1.702 m), weight 180 lb 3.2 oz (81.7 kg). Patient is alert and in no acute distress. She does not have asterixis. Conjunctiva is pink. Sclera is nonicteric Oropharyngeal mucosa is normal. Few teeth are missing and rest in satisfactory condition. No neck masses or thyromegaly  noted. Cardiac exam with regular rhythm normal S1 and S2. No murmur or gallop noted. Lungs are clear to auscultation. Abdomen is symmetrical.  On palpation it is soft.  Spleen is easily palpable 7-8 cm below left costal margin and liver is also easily palpable 5 cm below RCM is firm and nontender. No LE edema or clubbing noted.  Labs/studies Results: Lab data from 08/08/2017 WBC 4.4, H&H 11.9 and 36.9 and platelet count 122K.  MCV 74.5.  Hemoglobin A1c was 6.1.  H&H was 7.4 and 23.3 on 08/20/2017 2 days after left knee replacement.  HCV antibody negative on 08/23/2017.  LFTs on 08/22/2017 Bilirubin 1.2, AP 84, AST 29, ALT 27 and albumin 3.1.   Lab data from 09/03/2017 Serum iron 44, TIBC 375 and saturation 12%. Serum ferritin 309.  Ultrasound on 08/24/2017 revealed cholelithiasis echogenic liver with normal flow in portal vein.Enlarged spleen measuring 19.8 x 20.9 x 11.3.  No evidence of ascites.   Assessment;  Patient is a 67 year old Caucasian female who was found to be anemic and thrombocytopenic when she underwent preop evaluation for knee replacement.  She underwent extensive workup including bone marrow and no abnormality discovered.  In the process she was found to have splenomegaly and fatty liver.  Now she presents with frequent nausea and vomiting associated with weight loss. GI issues can be summarized as below.  #1. Nausea vomiting and weight loss.  She has had these symptoms for 6 months but lately they have been on daily basis.  These symptoms cannot be explained the basis of chronic liver disease.  Need to rule out peptic ulcer disease as well as celiac disease since she has been having diarrhea as well. Abdominopelvic CT would be obtained to survey her liver as well as pancreas and small bowel followed by esophagogastroduodenoscopy both for diagnostic purposes and screening for varices.   Finally patient needs to take pantoprazole correctly and not after meals or at  bedtime.  #2. She possibly has cirrhosis secondary to fatty liver.  She was told by her physician 3 years ago of this diagnosis.  She appears to have well preserved hepatic function.  While ultrasound shows fatty liver does not show liver contour suggestive of cirrhosis.  Therefore will image her liver with another modality and she would also be screened for esophageal/gastric varices given that she has thrombocytopenia and splenomegaly.  Once she is confirmed to have cirrhosis will check for hepatitis B and for other etiologies.  #3. Abnormal iron studies.  She had a low serum iron and saturation but normal serum ferritin which could be acute phase reactant.  3 Hemoccults were negative.  It is possible that she has iron deficiency anemia due to impaired iron absorption as of chronic PPI therapy.  Hemoglobin 3 weeks ago was normal at 13.3 g.   Recommendations;  Patient advised to take pantoprazole 40 mg 30 minutes before breakfast and 30 minutes before evening meal. Abdominopelvic CT with contrast. Diagnostic esophagogastroduodenoscopy in the near future. Patient advised to keep food and symptom diary for the next few weeks. Will request colonoscopy records from [facility]. Further recommendations to follow.    Alisen Marsiglia  11/10/2017, 9:28 AM

## 2017-11-12 ENCOUNTER — Other Ambulatory Visit (INDEPENDENT_AMBULATORY_CARE_PROVIDER_SITE_OTHER): Payer: Self-pay | Admitting: Internal Medicine

## 2017-11-12 DIAGNOSIS — R112 Nausea with vomiting, unspecified: Secondary | ICD-10-CM

## 2017-11-12 NOTE — Addendum Note (Signed)
Addended by: Rogene Houston on: 11/12/2017 10:53 AM   Modules accepted: Orders

## 2017-11-18 ENCOUNTER — Ambulatory Visit (INDEPENDENT_AMBULATORY_CARE_PROVIDER_SITE_OTHER): Payer: Medicare Other | Admitting: Internal Medicine

## 2017-11-21 ENCOUNTER — Other Ambulatory Visit: Payer: Self-pay

## 2017-11-21 ENCOUNTER — Encounter (HOSPITAL_COMMUNITY)
Admission: RE | Disposition: A | Discharge status: Home / Self Care | Payer: Self-pay | Source: Ambulatory Visit | Attending: Internal Medicine

## 2017-11-21 ENCOUNTER — Encounter (HOSPITAL_COMMUNITY): Payer: Self-pay | Admitting: Anesthesiology

## 2017-11-21 ENCOUNTER — Ambulatory Visit (HOSPITAL_COMMUNITY)
Admission: RE | Admit: 2017-11-21 | Discharge: 2017-11-21 | Disposition: A | Discharge status: Home / Self Care | Payer: Medicare HMO | Source: Ambulatory Visit | Attending: Internal Medicine | Admitting: Internal Medicine

## 2017-11-21 ENCOUNTER — Encounter (HOSPITAL_COMMUNITY): Payer: Self-pay | Admitting: *Deleted

## 2017-11-21 DIAGNOSIS — E119 Type 2 diabetes mellitus without complications: Secondary | ICD-10-CM | POA: Insufficient documentation

## 2017-11-21 DIAGNOSIS — G4733 Obstructive sleep apnea (adult) (pediatric): Secondary | ICD-10-CM | POA: Insufficient documentation

## 2017-11-21 DIAGNOSIS — K219 Gastro-esophageal reflux disease without esophagitis: Secondary | ICD-10-CM | POA: Insufficient documentation

## 2017-11-21 DIAGNOSIS — E7800 Pure hypercholesterolemia, unspecified: Secondary | ICD-10-CM | POA: Insufficient documentation

## 2017-11-21 DIAGNOSIS — Z79890 Hormone replacement therapy (postmenopausal): Secondary | ICD-10-CM | POA: Insufficient documentation

## 2017-11-21 DIAGNOSIS — K228 Other specified diseases of esophagus: Secondary | ICD-10-CM | POA: Diagnosis not present

## 2017-11-21 DIAGNOSIS — Z96652 Presence of left artificial knee joint: Secondary | ICD-10-CM | POA: Diagnosis not present

## 2017-11-21 DIAGNOSIS — E039 Hypothyroidism, unspecified: Secondary | ICD-10-CM | POA: Insufficient documentation

## 2017-11-21 DIAGNOSIS — Z7984 Long term (current) use of oral hypoglycemic drugs: Secondary | ICD-10-CM | POA: Diagnosis not present

## 2017-11-21 DIAGNOSIS — K3189 Other diseases of stomach and duodenum: Secondary | ICD-10-CM | POA: Diagnosis not present

## 2017-11-21 DIAGNOSIS — I10 Essential (primary) hypertension: Secondary | ICD-10-CM | POA: Diagnosis not present

## 2017-11-21 DIAGNOSIS — Z79899 Other long term (current) drug therapy: Secondary | ICD-10-CM | POA: Diagnosis not present

## 2017-11-21 DIAGNOSIS — R634 Abnormal weight loss: Secondary | ICD-10-CM | POA: Insufficient documentation

## 2017-11-21 DIAGNOSIS — K760 Fatty (change of) liver, not elsewhere classified: Secondary | ICD-10-CM | POA: Diagnosis not present

## 2017-11-21 DIAGNOSIS — K766 Portal hypertension: Secondary | ICD-10-CM | POA: Insufficient documentation

## 2017-11-21 DIAGNOSIS — R112 Nausea with vomiting, unspecified: Secondary | ICD-10-CM

## 2017-11-21 HISTORY — PX: ESOPHAGOGASTRODUODENOSCOPY: SHX5428

## 2017-11-21 LAB — GLUCOSE, CAPILLARY: GLUCOSE-CAPILLARY: 160 mg/dL — AB (ref 65–99)

## 2017-11-21 SURGERY — EGD (ESOPHAGOGASTRODUODENOSCOPY)
Anesthesia: Moderate Sedation

## 2017-11-21 MED ORDER — SODIUM CHLORIDE 0.9 % IV SOLN
INTRAVENOUS | Status: DC
  Administered 2017-11-21: 12:00:00 via INTRAVENOUS

## 2017-11-21 MED ORDER — MIDAZOLAM HCL 5 MG/5ML IJ SOLN
INTRAMUSCULAR | Status: DC | PRN
  Administered 2017-11-21: 2 mg via INTRAVENOUS
  Administered 2017-11-21: 1 mg via INTRAVENOUS
  Administered 2017-11-21: 2 mg via INTRAVENOUS

## 2017-11-21 MED ORDER — STERILE WATER FOR IRRIGATION IR SOLN
Status: DC | PRN
  Administered 2017-11-21: 12:00:00

## 2017-11-21 MED ORDER — MEPERIDINE HCL 50 MG/ML IJ SOLN
INTRAMUSCULAR | Status: AC
  Filled 2017-11-21: qty 1

## 2017-11-21 MED ORDER — MEPERIDINE HCL 50 MG/ML IJ SOLN
INTRAMUSCULAR | Status: DC | PRN
  Administered 2017-11-21 (×2): 25 mg via INTRAVENOUS

## 2017-11-21 MED ORDER — LIDOCAINE VISCOUS 2 % MT SOLN
OROMUCOSAL | Status: DC | PRN
  Administered 2017-11-21: 4 mL via OROMUCOSAL

## 2017-11-21 MED ORDER — LIDOCAINE VISCOUS 2 % MT SOLN
OROMUCOSAL | Status: AC
  Filled 2017-11-21: qty 15

## 2017-11-21 MED ORDER — MIDAZOLAM HCL 5 MG/5ML IJ SOLN
INTRAMUSCULAR | Status: AC
  Filled 2017-11-21: qty 10

## 2017-11-21 NOTE — Discharge Instructions (Signed)
Decrease pantoprazole to once a day.  Take 40 mg by mouth 30 minutes before breakfast daily. Resume other medications as before. Resume usual diet. Abdominal CT next week as planned. No driving for 24 hours.    Esophagogastroduodenoscopy, Care After Refer to this sheet in the next few weeks. These instructions provide you with information about caring for yourself after your procedure. Your health care provider may also give you more specific instructions. Your treatment has been planned according to current medical practices, but problems sometimes occur. Call your health care provider if you have any problems or questions after your procedure. What can I expect after the procedure? After the procedure, it is common to have:  A sore throat.  Nausea.  Bloating.  Dizziness.  Fatigue.  Follow these instructions at home:  Do not eat or drink anything until the numbing medicine (local anesthetic) has worn off and your gag reflex has returned. You will know that the local anesthetic has worn off when you can swallow comfortably.  Do not drive for 24 hours if you received a medicine to help you relax (sedative).  If your health care provider took a tissue sample for testing during the procedure, make sure to get your test results. This is your responsibility. Ask your health care provider or the department performing the test when your results will be ready.  Keep all follow-up visits as told by your health care provider. This is important. Contact a health care provider if:  You cannot stop coughing.  You are not urinating.  You are urinating less than usual. Get help right away if:  You have trouble swallowing.  You cannot eat or drink.  You have throat or chest pain that gets worse.  You are dizzy or light-headed.  You faint.  You have nausea or vomiting.  You have chills.  You have a fever.  You have severe abdominal pain.  You have black, tarry, or bloody  stools. This information is not intended to replace advice given to you by your health care provider. Make sure you discuss any questions you have with your health care provider. Document Released: 10/07/2012 Document Revised: 03/28/2016 Document Reviewed: 09/14/2015 Elsevier Interactive Patient Education  [employer].

## 2017-11-21 NOTE — H&P (Signed)
Katherine Bell is an 67 y.o. female.   Chief Complaint: Patient is here for EGD. HPI: Patient is 67 year old Caucasian female who presents with who was evaluated in the office on 11/10/2017 for 56-month history of nausea vomiting loss.  She has not responded to PPI therapy.  She has history of fatty liver.  She is suspected of cirrhosis. He has not experienced hematemesis melena or rectal bleeding. Ultrasound about 3 months ago and splenomegaly fatty liver and cholelithiasis.  She does not take OTC NSAIDs.  Past Medical History:  Diagnosis Date  . Acute blood loss as cause of postoperative anemia 08/19/2017  . Anemia of chronic disease 08/19/2017  . Anxiety   . Corneal ulcer, left   . Diabetes mellitus without complication ([place])   . GERD (gastroesophageal reflux disease)   . History of kidney stones   . Hypercholesteremia   . Hypertension   . Hypothyroidism   . Osteoarthritis of left knee   . Primary localized osteoarthrosis of the knee, left 08/06/2017  . Sleep apnea, obstructive    used for a year -off for a year    Past Surgical History:  Procedure Laterality Date  . ABDOMINAL HYSTERECTOMY  1996  . BLADDER SUSPENSION  1997   4 different procedures  . CATARACT EXTRACTION W/ INTRAOCULAR LENS IMPLANT Left 2004  . DACROCYSTORHINOSTOMY W/ JONES TUBE Left 2004   for tear duct  . EYE SURGERY    . TOTAL KNEE ARTHROPLASTY Left 08/18/2017   Procedure: LEFT TOTAL KNEE ARTHROPLASTY;  Surgeon: Elsie Saas, MD;  Location: [facility];  Service: Orthopedics;  Laterality: Left;    Family History  Problem Relation Age of Onset  . Suicidality Father   . Diabetes Brother   . Hypertension Brother   . Heart disease Brother    Social History:  reports that  has never smoked. she has never used smokeless tobacco. She reports that she does not drink alcohol or use drugs.  Allergies:  Allergies  Allergen Reactions  . Penicillins Hives, Itching and Other (See Comments)    Has taken keflex  without difficulty      Has patient had a PCN reaction causing immediate rash, facial/tongue/throat swelling, SOB or lightheadedness with hypotension: Yes Has patient had a PCN reaction causing severe rash involving mucus membranes or skin necrosis: No  Has patient had a PCN reaction that required hospitalization: No Has patient had a PCN reaction occurring within the last 10 years: No If all of the above answers are "NO", then may proceed with Cephalosporin use.   . Sulfa Antibiotics Hives    Medications Prior to Admission  Medication Sig Dispense Refill  . acetaminophen (TYLENOL) 325 MG tablet Take 2 tablets (650 mg total) by mouth every 6 (six) hours as needed for mild pain (or Fever >/= 101).    Marland Kitchen acyclovir (ZOVIRAX) 400 MG tablet Take 400 mg by mouth 2 (two) times daily.    Marland Kitchen atorvastatin (LIPITOR) 40 MG tablet Take 20 mg by mouth daily.     . fexofenadine (ALLEGRA) 180 MG tablet Take 180 mg by mouth daily as needed for allergies.     Marland Kitchen glipiZIDE (GLUCOTROL) 5 MG tablet Take 5 mg by mouth 2 (two) times daily before a meal.    . hydrochlorothiazide (HYDRODIURIL) 25 MG tablet Take 25 mg by mouth daily.    Marland Kitchen levothyroxine (SYNTHROID, LEVOTHROID) 100 MCG tablet Take 100 mcg by mouth daily before breakfast.    . losartan (COZAAR) 50 MG  tablet Take 50 mg by mouth daily.     . metFORMIN (GLUCOPHAGE) 1000 MG tablet Take 1,000 mg by mouth 2 (two) times daily with a meal.    . Multiple Vitamins-Minerals (VISION FORMULA PO) Take 1 capsule by mouth daily.     . pantoprazole (PROTONIX) 40 MG tablet Take 40 mg by mouth 2 (two) times daily.    . sertraline (ZOLOFT) 100 MG tablet Take 100 mg by mouth 2 (two) times daily.    . diclofenac sodium (VOLTAREN) 1 % GEL Apply 1 application topically daily as needed (for pain).      Results for orders placed or performed during the hospital encounter of 11/21/17 (from the past 48 hour(s))  Glucose, capillary     Status: Abnormal   Collection Time: 11/21/17  11:46 AM  Result Value Ref Range   Glucose-Capillary 160 (H) 65 - 99 mg/dL   No results found.  ROS  Blood pressure (!) 137/98, pulse (!) 112, temperature (!) 97.4 F (36.3 C), temperature source Oral, resp. rate 17, height 5\' 7"  (1.702 m), weight 180 lb (81.6 kg), SpO2 97 %. Physical Exam  Constitutional: She appears well-developed and well-nourished.  HENT:  Mouth/Throat: Oropharynx is clear and moist.  Eyes: Conjunctivae are normal. No scleral icterus.  Neck: No thyromegaly present.  Cardiovascular: Normal rate, regular rhythm and normal heart sounds.  No murmur heard. Respiratory: Effort normal and breath sounds normal.  GI: Soft. She exhibits no distension and no mass. There is no tenderness.  Musculoskeletal: She exhibits no edema.  Lymphadenopathy:    She has no cervical adenopathy.  Neurological: She is alert.  Skin: Skin is warm and dry.     Assessment/Plan Nausea vomiting and weight loss. History of fatty liver.  Cirrhosis suspected. Cholelithiasis.? Cause of N/V Diagnostic EGD.  Hildred Laser, MD 11/21/2017, 12:03 PM

## 2017-11-21 NOTE — Op Note (Signed)
[facility] Patient Name: Katherine Bell [facility] Procedure Date: 11/21/2017 11:51 AM MRN: [id number] Date of Birth: [birth year] Attending MD: Hildred Laser , MD CSN: [id number] Age: 67 Admit Type: Outpatient Procedure:                Upper GI endoscopy Indications:              Nausea with vomiting, Weight loss Providers:                Hildred Laser, MD, Otis Peak B. Sharon Seller, RN, Starla Link RN, RN Referring MD:             Arsenio Katz, [facility] Medicines:                Lidocaine spray, Meperidine 50 mg IV, Midazolam 5                            mg IV Complications:            No immediate complications. Estimated Blood Loss:     Estimated blood loss: none. Procedure:                Pre-Anesthesia Assessment:                           - Prior to the procedure, a History and Physical                            was performed, and patient medications and                            allergies were reviewed. The patient's tolerance of                            previous anesthesia was also reviewed. The risks                            and benefits of the procedure and the sedation                            options and risks were discussed with the patient.                            All questions were answered, and informed consent                            was obtained. Prior Anticoagulants: The patient has                            taken no previous anticoagulant or antiplatelet                            agents. ASA Grade Assessment: III - A patient with  severe systemic disease. After reviewing the risks                            and benefits, the patient was deemed in                            satisfactory condition to undergo the procedure.                           After obtaining informed consent, the endoscope was                            passed under direct vision. Throughout the                            procedure,  the patient's blood pressure, pulse, and                            oxygen saturations were monitored continuously. The                            [id number] ([id number]) scope was introduced through the                            and advanced to the second part of duodenum. The                            upper GI endoscopy was accomplished without                            difficulty. The patient tolerated the procedure                            well. Scope In: 12:16:39 PM Scope Out: 12:22:07 PM Total Procedure Duration: 0 hours 5 minutes 28 seconds  Findings:      The examined esophagus was normal.      The Z-line was irregular and was found 43 cm from the incisors.      Mild portal hypertensive gastropathy was found in the gastric fundus and       in the gastric body.      The exam of the stomach was otherwise normal.      The duodenal bulb and second portion of the duodenum were normal. Impression:               - Normal esophagus.                           - Z-line irregular, 43 cm from the incisors.                           - Portal hypertensive gastropathy.                           - Normal duodenal bulb and second portion of the  duodenum.                           - No specimens collected.                           Comment: Her symptoms may be due to cholelithiasis.                           CT abdomen as planned.                           If CT is negative will recommend cholecystectomy. Moderate Sedation:      Moderate (conscious) sedation was administered by the endoscopy nurse       and supervised by the endoscopist. The following parameters were       monitored: oxygen saturation, heart rate, blood pressure, CO2       capnography and response to care. Total physician intraservice time was       11 minutes. Recommendation:           - Patient has a contact number available for                            emergencies. The signs and symptoms of  potential                            delayed complications were discussed with the                            patient. Return to normal activities tomorrow.                            Written discharge instructions were provided to the                            patient.                           - Resume previous diet today.                           - Continue present medications. Procedure Code(s):        --- Professional ---                           [phone], Moderate sedation services provided by the                            same physician or other qualified health care                            professional performing the diagnostic or                            therapeutic service that the sedation supports,  requiring the presence of an independent trained                            observer to assist in the monitoring of the                            patient's level of consciousness and physiological                            status; initial 15 minutes of intraservice time,                            patient age 75 years or older Diagnosis Code(s):        --- Professional ---                           K22.8, Other specified diseases of esophagus                           K76.6, Portal hypertension                           K31.89, Other diseases of stomach and duodenum                           R11.2, Nausea with vomiting, unspecified                           R63.4, Abnormal weight loss CPT copyright 2016 American Medical Association. All rights reserved. The codes documented in this report are preliminary and upon coder review may  be revised to meet current compliance requirements. Hildred Laser, MD Hildred Laser, MD 11/21/2017 12:34:15 PM This report has been signed electronically. Number of Addenda: 0

## 2017-11-24 ENCOUNTER — Ambulatory Visit (HOSPITAL_COMMUNITY)
Admission: RE | Admit: 2017-11-24 | Discharge: 2017-11-24 | Disposition: A | Discharge status: Home / Self Care | Payer: Medicare HMO | Source: Ambulatory Visit | Attending: Internal Medicine | Admitting: Internal Medicine

## 2017-11-24 ENCOUNTER — Encounter (HOSPITAL_COMMUNITY): Payer: Self-pay | Admitting: Internal Medicine

## 2017-11-24 DIAGNOSIS — R161 Splenomegaly, not elsewhere classified: Secondary | ICD-10-CM | POA: Insufficient documentation

## 2017-11-24 DIAGNOSIS — R112 Nausea with vomiting, unspecified: Secondary | ICD-10-CM | POA: Diagnosis not present

## 2017-11-24 DIAGNOSIS — K8020 Calculus of gallbladder without cholecystitis without obstruction: Secondary | ICD-10-CM | POA: Insufficient documentation

## 2017-11-24 MED ORDER — IOPAMIDOL (ISOVUE-300) INJECTION 61%
100.0000 mL | Freq: Once | INTRAVENOUS | Status: AC | PRN
  Administered 2017-11-24: 100 mL via INTRAVENOUS

## 2017-11-25 ENCOUNTER — Telehealth (INDEPENDENT_AMBULATORY_CARE_PROVIDER_SITE_OTHER): Payer: Self-pay | Admitting: *Deleted

## 2017-11-25 ENCOUNTER — Telehealth: Payer: Self-pay | Admitting: Hematology and Oncology

## 2017-11-25 NOTE — Telephone Encounter (Signed)
Patient called in anxious about her results.  [phone]

## 2017-11-25 NOTE — Telephone Encounter (Signed)
Patient canceled appointments with Dr. Lebron Conners because she has seen a GI doctor and has resolved her health issues.

## 2017-11-25 NOTE — Telephone Encounter (Signed)
CLINICAL DATA:  Nausea and vomiting and diarrhea. 23 lb weight loss over past 3 months. Splenomegaly. Cholelithiasis and hepatic steatosis.  EXAM: CT ABDOMEN WITH CONTRAST  TECHNIQUE: Multidetector CT imaging of the abdomen was performed using the standard protocol following bolus administration of intravenous contrast.  CONTRAST:  165mL ISOVUE-300 IOPAMIDOL (ISOVUE-300) INJECTION 61%  COMPARISON:  None.  FINDINGS: Lower chest: No acute findings.  Hepatobiliary: No hepatic masses identified. Tiny calcified gallstones are noted, however there is no evidence of cholecystitis or biliary ductal dilatation.  Pancreas:  No mass or inflammatory changes.  Spleen: Moderate to severe splenomegaly with length measuring approximately 20-21 cm. No splenic masses identified.  Adrenals/Urinary Tract: No masses identified. A few tiny sub-cm renal cysts noted. No evidence of hydronephrosis.  Stomach/Bowel: Visualized abdominal bowel unremarkable.  Vascular/Lymphatic: No pathologically enlarged lymph nodes identified. No abdominal aortic aneurysm. Aortic atherosclerosis.  Other:  None.  Musculoskeletal:  No suspicious bone lesions identified.  IMPRESSION: Marked splenomegaly. No lymphadenopathy or other abdominal soft tissue masses identified.  Cholelithiasis.  No radiographic evidence of cholecystitis.   Electronically Signed   By: Earle Gell M.D.   On: 11/24/2017 16:24    Forwarded to Dr.Rehman to address with the patient.

## 2017-11-27 ENCOUNTER — Ambulatory Visit: Payer: Medicare Other | Admitting: Hematology and Oncology

## 2017-11-27 ENCOUNTER — Other Ambulatory Visit: Payer: Medicare Other

## 2017-12-11 ENCOUNTER — Ambulatory Visit (INDEPENDENT_AMBULATORY_CARE_PROVIDER_SITE_OTHER): Payer: Medicare Other | Admitting: General Surgery

## 2017-12-11 ENCOUNTER — Encounter: Payer: Self-pay | Admitting: General Surgery

## 2017-12-11 VITALS — BP 130/83 | HR 107 | Temp 96.6°F | Resp 18 | Ht 67.0 in | Wt 180.0 lb

## 2017-12-11 DIAGNOSIS — R1030 Lower abdominal pain, unspecified: Secondary | ICD-10-CM | POA: Diagnosis not present

## 2017-12-11 DIAGNOSIS — K760 Fatty (change of) liver, not elsewhere classified: Secondary | ICD-10-CM | POA: Diagnosis not present

## 2017-12-11 DIAGNOSIS — K8020 Calculus of gallbladder without cholecystitis without obstruction: Secondary | ICD-10-CM | POA: Insufficient documentation

## 2017-12-11 DIAGNOSIS — R112 Nausea with vomiting, unspecified: Secondary | ICD-10-CM

## 2017-12-11 DIAGNOSIS — R161 Splenomegaly, not elsewhere classified: Secondary | ICD-10-CM | POA: Diagnosis not present

## 2017-12-11 DIAGNOSIS — R197 Diarrhea, unspecified: Secondary | ICD-10-CM

## 2017-12-11 MED ORDER — METOCLOPRAMIDE HCL 5 MG PO TABS: 5.0000 mg | ORAL_TABLET | Freq: Three times a day (TID) | ORAL | 1 refills | Status: DC

## 2017-12-11 NOTE — Progress Notes (Signed)
[facility] History and Physical  Reason for Referral: Cholelithiasis (Nausea and vomiting); fatty liver  Referring Physician:  Dr. Laural Golden, Arsenio Katz, DNP  Chief Complaint    Abdominal Pain      Katherine Bell is a 67 y.o. female.  HPI: Katherine Bell is a very pleasant 67 yo who has complaints of chronic nausea, vomiting, diarrhea, and lower abdominal pain that has been going on and getting worse over the last few months.  She also complains of early satiety and inability to eat large amounts with an immediate need to vomit and "clear her system." Throughout this time she has lost about 20-30 lbs. Back in October 2018, she was admitted in the hospital for a left knee surgery and shortly after started having fevers, and was diagnosed with mononucleosis per her report. The Mono screen in the chart is negative.  She then was found to have pancytopenia and an enlarged spleen and was referred to Heme/ Onc who felt like the most likely cause was post mononucleosis splenomegaly and a bone marrow was performed that was normal, and they felt like her issues with vomiting, nausea, and early satiety were related to possible gastroparesis but also noted that she did have gallstones on CT scan and fatty liver. She was eventually referred to Dr. Laural Golden and underwent a [place] that demonstrated stones, and normal portal flow, and an EGD that demonstrated some mild gastropathy and no other findings. A repeat CT scan was also performed that demonstrated the enlarged spleen to 20 cm and a food in the stomach.  She reports that in [place] in the past she underwent a study where she ate "eggs," and was told her stomach moved slow.  She has never had a gastric emptying study in our system, and has not been on reglan or tried reglan.   She is tearful and anxious, and has come in today thinking that she was getting her gallbladder out and that it would fix her problems.   She denies any RUQ pain,  she denies any symptoms associated with specific foods. She says that everything has been worse since she has been found to have the enlarged spleen.   She had a colonoscopy within the last 10 years that was negative. She has been told in the past she had fatty liver.   Past Medical History:  Diagnosis Date  . Acute blood loss as cause of postoperative anemia 08/19/2017  . Anemia of chronic disease 08/19/2017  . Anxiety   . Corneal ulcer, left   . Diabetes mellitus without complication ([place])   . GERD (gastroesophageal reflux disease)   . History of kidney stones   . Hypercholesteremia   . Hypertension   . Hypothyroidism   . Osteoarthritis of left knee   . Primary localized osteoarthrosis of the knee, left 08/06/2017  . Sleep apnea, obstructive    used for a year -off for a year    Past Surgical History:  Procedure Laterality Date  . ABDOMINAL HYSTERECTOMY  1996  . BLADDER SUSPENSION  1997   4 different procedures  . CATARACT EXTRACTION W/ INTRAOCULAR LENS IMPLANT Left 2004  . DACROCYSTORHINOSTOMY W/ JONES TUBE Left 2004   for tear duct  . ESOPHAGOGASTRODUODENOSCOPY N/A 11/21/2017   Procedure: ESOPHAGOGASTRODUODENOSCOPY (EGD);  Surgeon: Rogene Houston, MD;  Location: [facility];  Service: Endoscopy;  Laterality: N/A;  225  . EYE SURGERY    . TOTAL KNEE ARTHROPLASTY Left 08/18/2017   Procedure: LEFT TOTAL  KNEE ARTHROPLASTY;  Surgeon: Elsie Saas, MD;  Location: [place];  Service: Orthopedics;  Laterality: Left;    Family History  Problem Relation Age of Onset  . Suicidality Father   . Diabetes Brother   . Hypertension Brother   . Heart disease Brother     Social History   Tobacco Use  . Smoking status: Never Smoker  . Smokeless tobacco: Never Used  Substance Use Topics  . Alcohol use: No  . Drug use: No    Medications: I have reviewed the patient's current medications. Allergies as of 12/11/2017      Reactions   Penicillins Hives, Itching, Other (See  Comments)   Has taken keflex without difficulty      Has patient had a PCN reaction causing immediate rash, facial/tongue/throat swelling, SOB or lightheadedness with hypotension: Yes Has patient had a PCN reaction causing severe rash involving mucus membranes or skin necrosis: No  Has patient had a PCN reaction that required hospitalization: No Has patient had a PCN reaction occurring within the last 10 years: No If all of the above answers are "NO", then may proceed with Cephalosporin use.   Sulfa Antibiotics Hives      Medication List        Accurate as of 12/11/17  2:19 PM. Always use your most recent med list.          acetaminophen 325 MG tablet Commonly known as:  TYLENOL Take 2 tablets (650 mg total) by mouth every 6 (six) hours as needed for mild pain (or Fever >/= 101).   acyclovir 400 MG tablet Commonly known as:  ZOVIRAX Take 400 mg by mouth 2 (two) times daily.   atorvastatin 40 MG tablet Commonly known as:  LIPITOR Take 20 mg by mouth daily.   diclofenac sodium 1 % Gel Commonly known as:  VOLTAREN Apply 1 application topically daily as needed (for pain).   fexofenadine 180 MG tablet Commonly known as:  ALLEGRA Take 180 mg by mouth daily as needed for allergies.   glipiZIDE 5 MG tablet Commonly known as:  GLUCOTROL Take 5 mg by mouth 2 (two) times daily before a meal.   hydrochlorothiazide 25 MG tablet Commonly known as:  HYDRODIURIL Take 25 mg by mouth daily.   levothyroxine 100 MCG tablet Commonly known as:  SYNTHROID, LEVOTHROID Take 100 mcg by mouth daily before breakfast.   losartan 50 MG tablet Commonly known as:  COZAAR Take 50 mg by mouth daily.   metFORMIN 1000 MG tablet Commonly known as:  GLUCOPHAGE Take 1,000 mg by mouth 2 (two) times daily with a meal.   pantoprazole 40 MG tablet Commonly known as:  PROTONIX Take 1 tablet (40 mg total) by mouth daily before breakfast.   sertraline 100 MG tablet Commonly known as:  ZOLOFT Take 100  mg by mouth 2 (two) times daily.   VISION FORMULA PO Take 1 capsule by mouth daily.        ROS:  A comprehensive review of systems was negative except for: Ears, nose, mouth, throat, and face: positive for sinus problems Cardiovascular: positive for HTN Gastrointestinal: positive for nausea, vomiting and indigestion Allergic/Immunologic: positive for hay fever  Blood pressure 130/83, pulse (!) 107, temperature (!) 96.6 F (35.9 C), resp. rate 18, height _0  (1.702 m), weight 180 lb (81.6 kg). Physical Exam  Constitutional: She is oriented to person, place, and time. She appears not malnourished. She appears not cachectic. She does not have a sickly appearance. She  appears distressed.  HENT:  Head: Normocephalic.  Eyes: Pupils are equal, round, and reactive to light.  Neck: Normal range of motion.  Cardiovascular: Normal rate and regular rhythm.  Abdominal: Soft. She exhibits no distension. There is splenomegaly. There is no tenderness. There is no rebound and no guarding.  Musculoskeletal: Normal range of motion. She exhibits no edema.  Neurological: She is alert and oriented to person, place, and time.  Skin: Skin is warm and dry.  Psychiatric: Memory, affect and judgment normal. Her mood appears not anxious. She does not exhibit a depressed mood.  Tearful on exam  Vitals reviewed.   Results: Personally reviewed imaging-  [place] 06/6577- stones, uncomplicated, CBD 4.6NG, fatty liver, splenomegaly  CT a/p 11/2017- Splenomegaly to 20 cm, portal vein measuring 14cm, food in stomach, nondistended gallbladder   Bone marrow biopsy- negative  Mono Screen- negative   Assessment & Plan:  Layali H Truex is a 67 y.o. female with nausea/ vomiting and early satiety, weight loss in the setting of multiple pathologies that could be contributing to this including likely gastroparesis, splenomegaly causing some degree of compression of the stomach to decrease emptying, and possible some  nausea/ vomiting from her gallstones, but much less likely than the previous two issues, esp given the onset of these symptoms.   -I have had a thorough discussion with the patient regarding the risk and benefits of surgery and the limited symptomatology she has related to a biliary colic, given that she has no RUQ pain, no food that causes the nausea/vomiting, and given her other pathologies.  -I counseled the patient about the indication, risks and benefits of laparoscopic cholecystectomy. -There is a very small chance for bleeding in a normal patient, but given her enlarged spleen and thrombocytopenia (although improved) bleeding in her is more likely and has a higher risk of needing conversion to an open procedure, and could result in an injury to an enlarged spleen.  -The risk of infection, injury to normal structures (including common bile duct) are the same in her as other patients.  -But I believe that the risk of  persistent symptoms would be highly likely in this patient given the other pathologies described above.  In addition, the diarrhea will likely get worse post-cholecystectomy.  The patient was visibly upset by my concerns and wanted for the cholecystectomy to be a cure because she is "tired of suffering."    -I educated her on the reasons behind my thinking and my concern for the other things going on in her body, and I wrote her a prescription for Reglan 5-10 mg with meals and qhs (starting with 5 and increasing to 15m if needed).   -I told her that this is not a NO to the cholecystectomy but rather an attempt to see if other pathologies are causing more of her symptoms, and if those can be managed with medications.   -Of Note, after the patient left, her husband returned to the office staff and reported that the patient's father had committed suicide due to doctors being unable to figure out his problems.  This was obviously a strange report, and they instructed him to take the  patient to the ED if there was any concern for suicidally.    -Since seeing the patient, I called to speak with her PCP AArsenio Katz DNP but she was unfortunately out of town, so I spoke with Ms. KNicanor Bakeand told her about Ms. Gad, the encounter with her husband, and the  concerns for gastroparesis and my prescription of reglan.  I have also asked her to order the gastric emptying study, and the urgency given the patient's clear distress, and she reports that she will do this today.    -In addition on speaking with Ms. Hairfield I indicated that the liver biopsy can wait or they can get radiology to perform one percutaneously with [place] or CT guidance, and that this may be completed more quickly than waiting of the possibility of a cholecystectomy in the future   -Ms. Hairfield is aware and will order the gastric emptying study and the biopsy of the liver.   -I have called and checked on the patient, and told her that her PCP was ordering these test. I reiterated that I am not saying that we will not remove her gallbladder, but that I am unsure that the symptoms will improve, and given this I believe the risks at this time especially given her spleen size outweigh the benefit that may not elucidated   -Follow up in a few weeks if no improvement with reglan, after the study   All questions were answered to the satisfaction of the patient and family.  I spent over 45 minutes with the patient explaining my concerns and rationale, and spent additional time coordinating care.   Virl Cagey 12/11/2017, 2:19 PM

## 2017-12-11 NOTE — Patient Instructions (Addendum)
Gastroparesis Gastroparesis, also called delayed gastric emptying, is a condition in which food takes longer than normal to empty from the stomach. The condition is usually long-lasting (chronic). What are the causes? This condition may be caused by:  An endocrine disorder, such as hypothyroidism or diabetes. Diabetes is the most common cause of this condition.  A nervous system disease, such as Parkinson disease or multiple sclerosis.  Cancer, infection, or surgery of the stomach or vagus nerve.  A connective tissue disorder, such as scleroderma.  Certain medicines.  In most cases, the cause is not known. What increases the risk? This condition is more likely to develop in:  People with certain disorders, including endocrine disorders, eating disorders, amyloidosis, and scleroderma.  People with certain diseases, including Parkinson disease or multiple sclerosis.  People with cancer or infection of the stomach or vagus nerve.  People who have had surgery on the stomach or vagus nerve.  People who take certain medicines.  Women.  What are the signs or symptoms? Symptoms of this condition include:  An early feeling of fullness when eating.  Nausea.  Weight loss.  Vomiting.  Heartburn.  Abdominal bloating.  Inconsistent blood glucose levels.  Lack of appetite.  Acid from the stomach coming up into the esophagus (gastroesophageal reflux).  Spasms of the stomach.  Symptoms may come and go. How is this diagnosed? This condition is diagnosed with tests, such as:  Tests that check how long it takes food to move through the stomach and intestines. These tests include: ? Upper gastrointestinal (GI) series. In this test, X-rays of the intestines are taken after you drink a liquid. The liquid makes the intestines show up better on the X-rays. ? Gastric emptying scintigraphy. In this test, scans are taken after you eat food that contains a small amount of radioactive  material. ? Wireless capsule GI monitoring system. This test involves swallowing a capsule that records information about movement through the stomach.  Gastric manometry. This test measures electrical and muscular activity in the stomach. It is done with a thin tube that is passed down the throat and into the stomach.  Endoscopy. This test checks for abnormalities in the lining of the stomach. It is done with a long, thin tube that is passed down the throat and into the stomach.  An ultrasound. This test can help rule out gallbladder disease or pancreatitis as a cause of your symptoms. It uses sound waves to take pictures of the inside of your body.  How is this treated? There is no cure for gastroparesis. This condition may be managed with:  Treatment of the underlying condition causing the gastroparesis.  Lifestyle changes, including exercise and dietary changes. Dietary changes can include: ? Changes in what and when you eat. ? Eating smaller meals more often. ? Eating low-fat foods. ? Eating low-fiber forms of high-fiber foods, such as cooked vegetables instead of raw vegetables. ? Having liquid foods in place of solid foods. Liquid foods are easier to digest.  Medicines. These may be given to control nausea and vomiting and to stimulate stomach muscles.  Getting food through a feeding tube. This may be done in severe cases.  A gastric neurostimulator. This is a device that is inserted into the body with surgery. It helps improve stomach emptying and control nausea and vomiting.  Follow these instructions at home:  Follow your health care provider's instructions about exercise and diet.  Take medicines only as directed by your health care provider. Contact a  health care provider if: Your symptoms do not improve with treatment. Enlarged Spleen An enlarged spleen (splenomegaly) is when the spleen is larger than normal. This condition is usually noticed when the spleen is almost  twice its normal size. The spleen is an organ that is located in the upper left area of the abdomen, just under the ribs. The spleen is like a storage unit for red blood cells, and it also works to filter and clean the blood. It destroys cells that are damaged or worn out. The spleen is also important for fighting disease. An enlarged spleen is usually a sign of another health problem. What are the causes? This condition may be caused by:  Mononucleosis and some other viral infections.  Infection with certain bacteria or parasites.  Liver failure (cirrhosis) and other liver diseases.  Blood diseases, such as hemolytic anemia.  An overactive spleen (hypersplenism).  Blood cancers, such as leukemia or Hodgkin disease.  Metabolic disorders, such as Gaucher disease or Niemann-Pick disease.  Tumors and cysts.  Pressure or blood clots in the veins of the spleen.  Connective tissue disorders, such as lupus or rheumatoid arthritis.  What are the signs or symptoms? Symptoms of this condition include:  Pain in the upper left part of the abdomen. The pain may spread to the left shoulder or get worse when you take a breath.  Feeling full without eating or after eating only a small amount.  Feeling tired.  Chronic infections.  Bleeding or bruising easily.  In some cases, there are no symptoms. How is this diagnosed? This condition may be diagnosed during a physical exam when the health care provider feels the left upper part of your abdomen. You may also have tests, such as:  Blood tests to check red and white blood cells and other proteins and enzymes.  Imaging tests, such as an abdominal ultrasound, CT scan, or MRI.  Taking a tissue sample (biopsy) of the liver or bone marrow if there is concern that it is the cause of an enlarged spleen.  How is this treated? Treatment for this condition depends on the cause. Treatment aims to manage the conditions that cause swelling of the  spleen and reduce the size of the spleen. Treatment may include:  Medicines to treat infection or disease.  Radiation therapy.  Blood transfusions.  Vaccinations.  If these treatments do not help or if the cause cannot be found, surgery to remove the spleen (splenectomy) may be recommended. Follow these instructions at home:  Take over-the-counter and prescription medicines only as told by your health care provider.  If you were prescribed an antibiotic medicine, take it as told by your health care provider. Do not stop taking the antibiotic even if you start to feel better.  Follow instructions from your health care provider about limiting your activities. To avoid injury or a ruptured spleen, make sure you: ? Avoid contact sports. ? Wear a seat belt in the car.  Keep all follow-up visits as told by your health care provider. This is important. Contact a health care provider if:  Your symptoms do not improve as expected.  You have a fever or chills.  You feel generally ill.  You have increased pain when you take in a breath. Get help right away if:  You experience an injury or impact to the spleen area.  Your abdominal pain becomes severe.  You feel dizzy or you faint.  You feel very weak.  You have cold and clammy  skin.  You have sweating for no reason.  You have chest pain or difficulty breathing. This information is not intended to replace advice given to you by your health care provider. Make sure you discuss any questions you have with your health care provider. Document Released: 04/10/2010 Document Revised: 03/28/2016 Document Reviewed: 04/10/2015 Elsevier Interactive Patient Education  2018 [place] have new symptoms. Get help right away if:  You have severe abdominal pain that does not improve with treatment.  You have nausea that does not go away.  You cannot keep fluids down. This information is not intended to replace advice given  to you by your health care provider. Make sure you discuss any questions you have with your health care provider. Document Released: 10/21/2005 Document Revised: 03/28/2016 Document Reviewed: 10/17/2014 Elsevier Interactive Patient Education  2018 [employer].  Cholelithiasis Cholelithiasis is also called "gallstones." It is a kind of gallbladder disease. The gallbladder is an organ that stores a liquid (bile) that helps you digest fat. Gallstones may not cause symptoms (may be silent gallstones) until they cause a blockage, and then they can cause pain (gallbladder attack). Follow these instructions at home:  Take over-the-counter and prescription medicines only as told by your doctor.  Stay at a healthy weight.  Eat healthy foods. This includes: ? Eating fewer fatty foods, like fried foods. ? Eating fewer refined carbs (refined carbohydrates). Refined carbs are breads and grains that are highly processed, like white bread and white rice. Instead, choose whole grains like whole-wheat bread and brown rice. ? Eating more fiber. Almonds, fresh fruit, and beans are healthy sources of fiber.  Keep all follow-up visits as told by your doctor. This is important. Contact a doctor if:  You have sudden pain in the upper right side of your belly (abdomen). Pain might spread to your right shoulder or your chest. This may be a sign of a gallbladder attack.  You feel sick to your stomach (are nauseous).  You throw up (vomit).  You have been diagnosed with gallstones that have no symptoms and you get: ? Belly pain. ? Discomfort, burning, or fullness in the upper part of your belly (indigestion). Get help right away if:  You have sudden pain in the upper right side of your belly, and it lasts for more than 2 hours.  You have belly pain that lasts for more than 5 hours.  You have a fever or chills.  You keep feeling sick to your stomach or you keep throwing up.  Your skin or the whites of  your eyes turn yellow (jaundice).  You have dark-colored pee (urine).  You have light-colored poop (stool). Summary  Cholelithiasis is also called "gallstones."  The gallbladder is an organ that stores a liquid (bile) that helps you digest fat.  Silent gallstones are gallstones that do not cause symptoms.  A gallbladder attack may cause sudden pain in the upper right side of your belly. Pain might spread to your right shoulder or your chest. If this happens, contact your doctor.  If you have sudden pain in the upper right side of your belly that lasts for more than 2 hours, get help right away. This information is not intended to replace advice given to you by your health care provider. Make sure you discuss any questions you have with your health care provider. Document Released: 04/08/2008 Document Revised: 07/07/2016 Document Reviewed: 07/07/2016 Elsevier Interactive Patient Education  2017 [employer].   Liver Biopsy  The liver is a large organ in the upper right-hand side of your abdomen. A liver biopsy is a procedure in which a tissue sample is taken from the liver and examined under a microscope. The procedure is done to confirm a suspected problem. There are three types of liver biopsies:  Percutaneous. In this type, an incision is made in your abdomen. The sample is removed through the incision with a needle.  Laparoscopic. In this type, several incisions are made in the abdomen. A tiny camera is passed through one of the incisions to help guide the health care provider. The sample is removed through the other incision or incisions.  Transjugular. In this type, an incision is made in the neck. A tube is passed through the incision to the liver. The sample is removed through the tube with a needle.  Tell a health care provider about:  Any allergies you have.  All medicines you are taking, including vitamins, herbs, eye drops, creams, and over-the-counter medicines.  Any  problems you or family members have had with anesthetic medicines.  Any blood disorders you have.  Any surgeries you have had.  Any medical conditions you have.  Possibility of pregnancy, if this applies. What are the risks? Generally, this is a safe procedure. However, problems can occur and include:  Bleeding.  Infection.  Bruising.  Collapsed lung.  Leak of digestive juices (bile) from the liver or gallbladder.  Problems with heart rhythm.  Pain at the biopsy site or in the right shoulder.  Low blood pressure (hypotension).  Injury to nearby organs or tissues.  What happens before the procedure?  Your health care provider may do some blood or urine tests. These will help your health care provider learn how well your kidneys and liver are working and how well your blood clots.  Ask your health care provider if you will be able to go home the day of the procedure. Arrange for someone to take you home and stay with you for at least 24 hours.  Do not eat or drink anything after midnight on the night before the procedure or as directed by your health care provider.  Ask your health care provider about: ? Changing or stopping your regular medicines. This is especially important if you are taking diabetes medicines or blood thinners. ? Taking medicines such as aspirin and ibuprofen. These medicines can thin your blood. Do not take these medicines before your procedure if your health care provider asks you not to. What happens during the procedure? Regardless of the type of biopsy that will be done, you will have an IV line placed. Through this line, you will receive fluids and medicine to relax you. If you will be having a laparoscopic biopsy, you may also receive medicine through this line to make you sleep during the procedure (general anesthetic). Percutaneous Liver Biopsy  You will positioned on your back, with your right hand over your head.  A health care provider  will locate your liver by tapping and pressing on the right side of your abdomen or with the help of an ultrasound machine or CT scan.  An area at the bottom of your last right rib will be numbed.  An incision will be made in the numbed area.  The biopsy needle will be inserted into the incision.  Several samples of liver tissue will be taken with the biopsy needle. You will be asked to hold your breath as each sample is taken. Laparoscopic Liver Biopsy  You will be positioned on your back.  Several small incisions will be made in your abdomen.  Your doctor will pass a tiny camera through one incision. The camera will allow the liver to be viewed on a TV monitor in the operating room.  Tools will be passed through the other incision or incisions. These tools will be used to remove samples of liver tissue. Transjugular Liver Biopsy  You will be positioned on your back on an X-ray table, with your head turned to your left.  An area on your neck just over your jugular vein will be numbed.  An incision will be made in the numbed area.  A tiny tube will be inserted through the incision. It will be pushed through the jugular vein to a blood vessel in the liver called the hepatic vein.  Dye will be inserted through the tube, and X-rays will be taken. The dye will make the blood vessels in the liver light up on the X-rays.  The biopsy needle will be pushed through the tube until it reaches the liver.  Samples of liver tissue will be taken with the biopsy needle.  The needle and the tube will be removed. After the samples are obtained, the incision or incisions will be closed. What happens after the procedure?  You will be taken to a recovery area.  You may have to lie on your right side for 1-2 hours. This will prevent bleeding from the biopsy site.  Your progress will be watched. Your blood pressure, pulse, and the biopsy site will be checked often.  You may have some pain or  feel sick. If this happens, tell your health care provider.  As you begin to feel better, you will be offered ice and beverages.  You may be allowed to go home when the medicines have worn off and you can walk, drink, eat, and use the bathroom. This information is not intended to replace advice given to you by your health care provider. Make sure you discuss any questions you have with your health care provider. Document Released: 01/11/2004 Document Revised: 03/25/2016 Document Reviewed: 12/17/2013 Elsevier Interactive Patient Education  [employer].

## 2017-12-12 ENCOUNTER — Other Ambulatory Visit (HOSPITAL_COMMUNITY): Payer: Self-pay | Admitting: Nurse Practitioner

## 2017-12-12 DIAGNOSIS — K3184 Gastroparesis: Secondary | ICD-10-CM

## 2017-12-16 ENCOUNTER — Encounter (HOSPITAL_COMMUNITY): Payer: Self-pay

## 2017-12-16 ENCOUNTER — Encounter (HOSPITAL_COMMUNITY)
Admission: RE | Admit: 2017-12-16 | Discharge: 2017-12-16 | Disposition: A | Discharge status: Home / Self Care | Payer: Medicare Other | Source: Ambulatory Visit | Attending: Nurse Practitioner | Admitting: Nurse Practitioner

## 2017-12-16 DIAGNOSIS — M1712 Unilateral primary osteoarthritis, left knee: Secondary | ICD-10-CM | POA: Diagnosis not present

## 2017-12-16 DIAGNOSIS — R1110 Vomiting, unspecified: Secondary | ICD-10-CM | POA: Diagnosis not present

## 2017-12-16 DIAGNOSIS — K3184 Gastroparesis: Secondary | ICD-10-CM | POA: Diagnosis not present

## 2017-12-16 MED ORDER — TECHNETIUM TC 99M SULFUR COLLOID
2.0000 | Freq: Once | INTRAVENOUS | Status: AC | PRN
  Administered 2017-12-16: 2 via ORAL

## 2017-12-18 DIAGNOSIS — B0052 Herpesviral keratitis: Secondary | ICD-10-CM | POA: Diagnosis not present

## 2017-12-18 DIAGNOSIS — Z961 Presence of intraocular lens: Secondary | ICD-10-CM | POA: Diagnosis not present

## 2017-12-18 DIAGNOSIS — H353132 Nonexudative age-related macular degeneration, bilateral, intermediate dry stage: Secondary | ICD-10-CM | POA: Diagnosis not present

## 2017-12-19 DIAGNOSIS — K760 Fatty (change of) liver, not elsewhere classified: Secondary | ICD-10-CM | POA: Diagnosis not present

## 2017-12-19 DIAGNOSIS — K3184 Gastroparesis: Secondary | ICD-10-CM | POA: Diagnosis not present

## 2017-12-19 DIAGNOSIS — R161 Splenomegaly, not elsewhere classified: Secondary | ICD-10-CM | POA: Diagnosis not present

## 2017-12-29 NOTE — Progress Notes (Signed)
I called patient to schedule an appointment, patient states she is in the middle of tests right now and she will call back when she is ready to schedule.

## 2017-12-30 ENCOUNTER — Telehealth: Payer: Self-pay | Admitting: General Surgery

## 2017-12-30 NOTE — Telephone Encounter (Signed)
[facility]  Ms. Cyndi Bender called to inform me that the liver biopsy was done and showed some fibrosis.  Also that the patient continues to have nausea/ vomiting while on Reglan. She also had a gastric emptying study that was normal.  Will have her return to discuss surgical options.   FINDINGS: Expected location of the stomach in the left upper quadrant. Ingested meal empties the stomach gradually over the course of the study.  29% emptied at 1 hr ( normal >= 10%)  57% emptied at 2 hr ( normal >= 40%)  80% emptied at 3 hr ( normal >= 70%)  95% emptied at 4 hr ( normal >= 90%)  IMPRESSION: Normal gastric emptying study.  Curlene Labrum, MD [facility] [address] [place],  [id number] [phone] (office)

## 2017-12-31 DIAGNOSIS — I10 Essential (primary) hypertension: Secondary | ICD-10-CM | POA: Diagnosis not present

## 2017-12-31 DIAGNOSIS — E119 Type 2 diabetes mellitus without complications: Secondary | ICD-10-CM | POA: Diagnosis not present

## 2017-12-31 DIAGNOSIS — E7800 Pure hypercholesterolemia, unspecified: Secondary | ICD-10-CM | POA: Diagnosis not present

## 2018-01-02 ENCOUNTER — Ambulatory Visit (INDEPENDENT_AMBULATORY_CARE_PROVIDER_SITE_OTHER): Payer: Medicare Other | Admitting: Vascular Surgery

## 2018-01-02 ENCOUNTER — Ambulatory Visit (HOSPITAL_COMMUNITY)
Admission: RE | Admit: 2018-01-02 | Discharge: 2018-01-02 | Disposition: A | Discharge status: Home / Self Care | Payer: Medicare Other | Source: Ambulatory Visit | Attending: Vascular Surgery | Admitting: Vascular Surgery

## 2018-01-02 ENCOUNTER — Encounter: Payer: Self-pay | Admitting: Vascular Surgery

## 2018-01-02 VITALS — BP 116/80 | HR 93 | Resp 18 | Ht 67.0 in | Wt 181.5 lb

## 2018-01-02 DIAGNOSIS — R9389 Abnormal findings on diagnostic imaging of other specified body structures: Secondary | ICD-10-CM | POA: Diagnosis not present

## 2018-01-02 DIAGNOSIS — I739 Peripheral vascular disease, unspecified: Secondary | ICD-10-CM | POA: Diagnosis not present

## 2018-01-02 DIAGNOSIS — M25561 Pain in right knee: Secondary | ICD-10-CM

## 2018-01-02 DIAGNOSIS — M25562 Pain in left knee: Secondary | ICD-10-CM

## 2018-01-02 DIAGNOSIS — I10 Essential (primary) hypertension: Secondary | ICD-10-CM | POA: Insufficient documentation

## 2018-01-02 DIAGNOSIS — E785 Hyperlipidemia, unspecified: Secondary | ICD-10-CM | POA: Diagnosis not present

## 2018-01-02 DIAGNOSIS — R0989 Other specified symptoms and signs involving the circulatory and respiratory systems: Secondary | ICD-10-CM | POA: Diagnosis present

## 2018-01-02 NOTE — Progress Notes (Signed)
Patient ID: Katherine Bell, female   DOB: 1951/01/21, 66 y.o.   MRN: [id number]  Reason for Consult: New Patient (Initial Visit) (eval poor circulation - ref by Dr. Manuella Ghazi)   Referred by Arsenio Katz, NP  Subjective:     HPI:  Katherine Bell is a 67 y.o. female is here for evaluation of poor circulation.  She has pain in her legs that begins early in the day.  She does not have any swelling.  She does not have any wounds or tissue loss.  She did have a toe treated 2 years ago on the left side that is healed well.  She also received underwent knee surgery on the left that also healed well.  She is a type II diabetic.  She does not have any complaints of claudication.  She does not take any blood thinners.  Currently she is not taking aspirin either.  Past Medical History:  Diagnosis Date  . Acute blood loss as cause of postoperative anemia 08/19/2017  . Anemia of chronic disease 08/19/2017  . Anxiety   . Corneal ulcer, left   . Diabetes mellitus without complication ([place])   . GERD (gastroesophageal reflux disease)   . History of kidney stones   . Hypercholesteremia   . Hypertension   . Hypothyroidism   . Osteoarthritis of left knee   . Primary localized osteoarthrosis of the knee, left 08/06/2017  . Sleep apnea, obstructive    used for a year -off for a year   Family History  Problem Relation Age of Onset  . Suicidality Father   . Diabetes Brother   . Hypertension Brother   . Heart disease Brother    Past Surgical History:  Procedure Laterality Date  . ABDOMINAL HYSTERECTOMY  1996  . BLADDER SUSPENSION  1997   4 different procedures  . CATARACT EXTRACTION W/ INTRAOCULAR LENS IMPLANT Left 2004  . DACROCYSTORHINOSTOMY W/ JONES TUBE Left 2004   for tear duct  . ESOPHAGOGASTRODUODENOSCOPY N/A 11/21/2017   Procedure: ESOPHAGOGASTRODUODENOSCOPY (EGD);  Surgeon: Rogene Houston, MD;  Location: [facility];  Service: Endoscopy;  Laterality: N/A;  225  . EYE SURGERY    .  TOTAL KNEE ARTHROPLASTY Left 08/18/2017   Procedure: LEFT TOTAL KNEE ARTHROPLASTY;  Surgeon: Elsie Saas, MD;  Location: [place];  Service: Orthopedics;  Laterality: Left;    Short Social History:  Social History   Tobacco Use  . Smoking status: Never Smoker  . Smokeless tobacco: Never Used  Substance Use Topics  . Alcohol use: No    Allergies  Allergen Reactions  . Penicillins Hives, Itching and Other (See Comments)    Has taken keflex without difficulty      Has patient had a PCN reaction causing immediate rash, facial/tongue/throat swelling, SOB or lightheadedness with hypotension: Yes Has patient had a PCN reaction causing severe rash involving mucus membranes or skin necrosis: No  Has patient had a PCN reaction that required hospitalization: No Has patient had a PCN reaction occurring within the last 10 years: No If all of the above answers are "NO", then may proceed with Cephalosporin use.   . Sulfa Antibiotics Hives    Current Outpatient Medications  Medication Sig Dispense Refill  . acetaminophen (TYLENOL) 325 MG tablet Take 2 tablets (650 mg total) by mouth every 6 (six) hours as needed for mild pain (or Fever >/= 101).    Marland Kitchen acyclovir (ZOVIRAX) 400 MG tablet Take 400 mg by mouth 2 (two) times  daily.    . atorvastatin (LIPITOR) 40 MG tablet Take 20 mg by mouth daily.     . fexofenadine (ALLEGRA) 180 MG tablet Take 180 mg by mouth daily as needed for allergies.     Marland Kitchen glipiZIDE (GLUCOTROL) 5 MG tablet Take 5 mg by mouth 2 (two) times daily before a meal.    . hydrochlorothiazide (HYDRODIURIL) 25 MG tablet Take 25 mg by mouth daily.    Marland Kitchen levothyroxine (SYNTHROID, LEVOTHROID) 100 MCG tablet Take 100 mcg by mouth daily before breakfast.    . losartan (COZAAR) 50 MG tablet Take 50 mg by mouth daily.     . metFORMIN (GLUCOPHAGE) 1000 MG tablet Take 1,000 mg by mouth 2 (two) times daily with a meal.    . Multiple Vitamins-Minerals (VISION FORMULA PO) Take 1 capsule by mouth  daily.     . pantoprazole (PROTONIX) 40 MG tablet Take 1 tablet (40 mg total) by mouth daily before breakfast.    . sertraline (ZOLOFT) 100 MG tablet Take 100 mg by mouth 2 (two) times daily.    . diclofenac sodium (VOLTAREN) 1 % GEL Apply 1 application topically daily as needed (for pain).    Marland Kitchen metoCLOPramide (REGLAN) 5 MG tablet Take 1-2 tablets (5-10 mg total) by mouth 3 (three) times daily before meals. Start with 5mg  and if no improvement after 2 days, then increase to 10mg . Do not take this medication for 72 hours prior to your gastric emptying study. (Patient not taking: Reported on 01/02/2018) 120 tablet 1   No current facility-administered medications for this visit.     Review of Systems  Constitutional:  Constitutional negative. HENT: HENT negative.  Eyes: Eyes negative.  Respiratory: Respiratory negative.  GI: Gastrointestinal negative.  Musculoskeletal: Positive for leg pain and joint pain.  Skin: Skin negative.  Neurological: Neurological negative. Hematologic: Hematologic/lymphatic negative.  Psychiatric: Psychiatric negative.        Objective:  Objective   Vitals:   01/02/18 0857  BP: 116/80  Pulse: 93  Resp: 18  SpO2: 98%  Weight: 181 lb 8 oz (82.3 kg)  Height: 5\' 7"  (1.702 m)   Body mass index is 28.43 kg/m.  Physical Exam  Constitutional: She appears well-developed.  Eyes: Pupils are equal, round, and reactive to light.  Neck: Normal range of motion. Neck supple.  Cardiovascular: Normal rate and regular rhythm.  Pulses:      Carotid pulses are 2+ on the right side, and 2+ on the left side.      Radial pulses are 2+ on the right side, and 2+ on the left side.       Popliteal pulses are 2+ on the right side, and 2+ on the left side.       Dorsalis pedis pulses are 2+ on the left side.       Posterior tibial pulses are 2+ on the left side.  Abdominal: Soft. Bowel sounds are normal. She exhibits no mass.  Musculoskeletal: She exhibits no edema.    Lymphadenopathy:    She has no cervical adenopathy.  Neurological: She is alert.  Skin: Skin is warm and dry.  Psychiatric: She has a normal mood and affect. Her behavior is normal. Judgment and thought content normal.    Data: I have independently interpreted her ABIs to be normal bilaterally 1.39 on the right and 1.14 on the left triphasic both sides.     Assessment/Plan:     67 year old diabetic female presents for evaluation of poor circulation although  she has normal ABIs.  She does have leg pain and purple discoloration of her toes.  I can palpate her pulses easily in her feet.  I discussed with her the importance of protecting her feet and that she should consider taking aspirin if it does not have any other contraindications for her.  She does demonstrate good understanding and can follow-up on a as needed basis.     Waynetta Sandy MD [facility]

## 2018-01-20 ENCOUNTER — Encounter: Payer: Self-pay | Admitting: General Surgery

## 2018-01-20 ENCOUNTER — Ambulatory Visit (INDEPENDENT_AMBULATORY_CARE_PROVIDER_SITE_OTHER): Payer: Medicare Other | Admitting: General Surgery

## 2018-01-20 VITALS — BP 153/88 | HR 98 | Temp 97.1°F | Resp 18 | Ht 67.0 in | Wt 184.0 lb

## 2018-01-20 DIAGNOSIS — K760 Fatty (change of) liver, not elsewhere classified: Secondary | ICD-10-CM

## 2018-01-20 DIAGNOSIS — K8020 Calculus of gallbladder without cholecystitis without obstruction: Secondary | ICD-10-CM | POA: Diagnosis not present

## 2018-01-20 DIAGNOSIS — R112 Nausea with vomiting, unspecified: Secondary | ICD-10-CM

## 2018-01-20 DIAGNOSIS — R161 Splenomegaly, not elsewhere classified: Secondary | ICD-10-CM | POA: Diagnosis not present

## 2018-01-20 NOTE — Progress Notes (Signed)
[facility] History and Physical  Reason for Referral: Nausea/ vomiting with cholelithiasis  Referring Physician:   Dr. Laural Golden, Arsenio Katz, DNP   Chief Complaint    Follow-up      Katherine Bell is a 67 y.o. female.  HPI: Katherine Bell is a very pleasant 67 yo who has complaints of chronic nausea, vomiting, diarrhea, and lower abdominal pain that has been going on and getting worse over the last few months.  She saw me a few weeks ago and given the complexity of her situation and mulitple etiologies for the nausea/vomiting aside from the gallstones, I told her that we needed to rule out other causes before proceeding with surgery.  She was upset by this but compiled.  She was given a Rx for Reglan and set up with a gastric emptying study that demonstrated normal stomach emptying.    In addition, during this time she has not had any issues with her spleen and still is unsure of the etiology and her splenomegaly that accompanied it.  She is under the impression it was from mononucleosis but the Mono spot in the hospital was negative.    In addition, since seeing me she underwent a liver biopsy that demonstrated some fibrosis but Dr. Laural Golden believes she has more scaring of her liver than these biopsies indicate given her gastropathy on EGD.    She denies any RUQ pain, she denies any symptoms associated with specific foods. She does feel like the bloating, nausea/vomiting is worse with certain foods.   She had a colonoscopy within the last 10 years that was negative. She has been told in the past she had fatty liver.      Past Medical History:  Diagnosis Date  . Acute blood loss as cause of postoperative anemia 08/19/2017  . Anemia of chronic disease 08/19/2017  . Anxiety   . Corneal ulcer, left   . Diabetes mellitus without complication ([place])   . GERD (gastroesophageal reflux disease)   . History of kidney stones   . Hypercholesteremia   . Hypertension   .  Hypothyroidism   . Osteoarthritis of left knee   . Primary localized osteoarthrosis of the knee, left 08/06/2017  . Sleep apnea, obstructive    used for a year -off for a year    Past Surgical History:  Procedure Laterality Date  . ABDOMINAL HYSTERECTOMY  1996  . BLADDER SUSPENSION  1997   4 different procedures  . CATARACT EXTRACTION W/ INTRAOCULAR LENS IMPLANT Left 2004  . DACROCYSTORHINOSTOMY W/ JONES TUBE Left 2004   for tear duct  . ESOPHAGOGASTRODUODENOSCOPY N/A 11/21/2017   Procedure: ESOPHAGOGASTRODUODENOSCOPY (EGD);  Surgeon: Rogene Houston, MD;  Location: [facility];  Service: Endoscopy;  Laterality: N/A;  225  . EYE SURGERY    . TOTAL KNEE ARTHROPLASTY Left 08/18/2017   Procedure: LEFT TOTAL KNEE ARTHROPLASTY;  Surgeon: Elsie Saas, MD;  Location: [facility];  Service: Orthopedics;  Laterality: Left;    Family History  Problem Relation Age of Onset  . Suicidality Father   . Diabetes Brother   . Hypertension Brother   . Heart disease Brother     Social History   Tobacco Use  . Smoking status: Never Smoker  . Smokeless tobacco: Never Used  Substance Use Topics  . Alcohol use: No  . Drug use: No    Medications: I have reviewed the patient's current medications. Prior to Admission:  (Not in a hospital admission) Allergies as of 01/20/2018  Reactions   Penicillins Hives, Itching, Other (See Comments)   Has taken keflex without difficulty      Has patient had a PCN reaction causing immediate rash, facial/tongue/throat swelling, SOB or lightheadedness with hypotension: Yes Has patient had a PCN reaction causing severe rash involving mucus membranes or skin necrosis: No  Has patient had a PCN reaction that required hospitalization: No Has patient had a PCN reaction occurring within the last 10 years: No If all of the above answers are "NO", then may proceed with Cephalosporin use.   Sulfa Antibiotics Hives      Medication List        Accurate as of  01/20/18  9:11 AM. Always use your most recent med list.          acetaminophen 325 MG tablet Commonly known as:  TYLENOL Take 2 tablets (650 mg total) by mouth every 6 (six) hours as needed for mild pain (or Fever >/= 101).   acyclovir 400 MG tablet Commonly known as:  ZOVIRAX Take 400 mg by mouth 2 (two) times daily.   atorvastatin 40 MG tablet Commonly known as:  LIPITOR Take 20 mg by mouth daily.   diclofenac sodium 1 % Gel Commonly known as:  VOLTAREN Apply 1 application topically daily as needed (for pain).   fexofenadine 180 MG tablet Commonly known as:  ALLEGRA Take 180 mg by mouth daily as needed for allergies.   glipiZIDE 5 MG tablet Commonly known as:  GLUCOTROL Take 5 mg by mouth 2 (two) times daily before a meal.   hydrochlorothiazide 25 MG tablet Commonly known as:  HYDRODIURIL Take 25 mg by mouth daily.   levothyroxine 100 MCG tablet Commonly known as:  SYNTHROID, LEVOTHROID Take 100 mcg by mouth daily before breakfast.   losartan 50 MG tablet Commonly known as:  COZAAR Take 50 mg by mouth daily.   metFORMIN 1000 MG tablet Commonly known as:  GLUCOPHAGE Take 1,000 mg by mouth 2 (two) times daily with a meal.   metoCLOPramide 5 MG tablet Commonly known as:  REGLAN Take 1-2 tablets (5-10 mg total) by mouth 3 (three) times daily before meals. Start with 5m and if no improvement after 2 days, then increase to 191m Do not take this medication for 72 hours prior to your gastric emptying study.   pantoprazole 40 MG tablet Commonly known as:  PROTONIX Take 1 tablet (40 mg total) by mouth daily before breakfast.   sertraline 100 MG tablet Commonly known as:  ZOLOFT Take 100 mg by mouth 2 (two) times daily.   VISION FORMULA PO Take 1 capsule by mouth daily.        ROS:  A comprehensive review of systems was negative except for: Gastrointestinal: positive for diarrhea, nausea and vomiting  Blood pressure (!) 153/88, pulse 98, temperature (!)  97.1 F (36.2 C), resp. rate 18, height '5\' 7"'  (1.702 m), weight 184 lb (83.5 kg). Physical Exam  Constitutional: She is oriented to person, place, and time and well-developed, well-nourished, and in no distress.  HENT:  Head: Normocephalic.  Eyes: Pupils are equal, round, and reactive to light.  Neck: Normal range of motion.  Cardiovascular: Normal rate and regular rhythm.  Pulmonary/Chest: Effort normal and breath sounds normal.  Abdominal: Soft. She exhibits no distension. There is no tenderness.  Spleen is less palpable compared to last exam, no masses appreciated   Neurological: She is alert and oriented to person, place, and time.  Skin: Skin is warm and dry.  No telangectasia, or varices on abdomen noted   Psychiatric: Mood, memory, affect and judgment normal.  Vitals reviewed.   Results: Personally reviewed imaging-  [place] 66/8159- stones, uncomplicated, CBD 4.7MR, fatty liver, splenomegaly  CT a/p 11/2017- Splenomegaly to 20 cm, portal vein measuring 14cm, food in stomach, nondistended gallbladder   Bone marrow biopsy- negative  Mono Screen- negative   Gastric emptying study- normal   Assessment & Plan:  Katherine Bell is a 67 y.o. female with chronic nausea/vomiting and bloating and diarrhea.  She has attempted the trial of reglan and did the gastric emptying study and this was negative. Her spleen also seems to be decreased in size.  She would like to have her gallbladder removed in order to rule this out as an etiology for the nausea/vomiting.   -We have discussed extensively that this may not help her at all but will get the question of the gallbladder off the table -We have discussed that she could continue to have nausea/vomiting and that the diarrhea will likely get worse -We have discussed her increased risk of bleeding given the history of pancytopenia and that we will check labs, she notes no new or recent issues with bleeding, and we have discussed that the  spleen itself could cause issues with bleeding  -We have discussed crossing her for blood products and she is agrees to receiving them if needed  -We have discussed the possible need for an open procedure   PLAN: I counseled the patient about the indication, risks and benefits of laparoscopic cholecystectomy and liver biopsy.  She understands there is a very small chance for bleeding, infection, injury to normal structures (including common bile duct), conversion to open surgery, persistent symptoms, evolution of postcholecystectomy diarrhea, need for secondary interventions, anesthesia reaction, cardiopulmonary issues and other risks not specifically detailed here or above. I described the expected recovery, the plan for follow-up and the restrictions during the recovery phase.  All questions were answered.   All questions were answered to the satisfaction of the patient and family.   Virl Cagey 01/20/2018, 9:11 AM

## 2018-01-20 NOTE — Patient Instructions (Signed)
Fatty Liver Fatty liver, also called hepatic steatosis or steatohepatitis, is a condition in which too much fat has built up in your liver cells. The liver removes harmful substances from your bloodstream. It produces fluids your body needs. It also helps your body use and store energy from the food you eat. In many cases, fatty liver does not cause symptoms or problems. It is often diagnosed when tests are being done for other reasons. However, over time, fatty liver can cause inflammation that may lead to more serious liver problems, such as scarring of the liver (cirrhosis). What are the causes? Causes of fatty liver may include:  Drinking too much alcohol.  Poor nutrition.  Obesity.  Cushing syndrome.  Diabetes.  Hyperlipidemia.  Pregnancy.  Certain drugs.  Poisons.  Some viral infections.  What increases the risk? You may be more likely to develop fatty liver if you:  Abuse alcohol.  Are pregnant.  Are overweight.  Have diabetes.  Have hepatitis.  Have a high triglyceride level.  What are the signs or symptoms? Fatty liver often does not cause any symptoms. In cases where symptoms develop, they can include:  Fatigue.  Weakness.  Weight loss.  Confusion.  Abdominal pain.  Yellowing of your skin and the white parts of your eyes (jaundice).  Nausea and vomiting.  How is this diagnosed? Fatty liver may be diagnosed by:  Physical exam and medical history.  Blood tests.  Imaging tests, such as an ultrasound, CT scan, or MRI.  Liver biopsy. A small sample of liver tissue is removed using a needle. The sample is then looked at under a microscope.  How is this treated? Fatty liver is often caused by other health conditions. Treatment for fatty liver may involve medicines and lifestyle changes to manage conditions such as:  Alcoholism.  High cholesterol.  Diabetes.  Being overweight or obese.  Follow these instructions at home:  Eat a  healthy diet as directed by your health care provider.  Exercise regularly. This can help you lose weight and control your cholesterol and diabetes. Talk to your health care provider about an exercise plan and which activities are best for you.  Do not drink alcohol.  Take medicines only as directed by your health care provider. Contact a health care provider if: You have difficulty controlling your:  Blood sugar.  Cholesterol.  Alcohol consumption.  Get help right away if:  You have abdominal pain.  You have jaundice.  You have nausea and vomiting. This information is not intended to replace advice given to you by your health care provider. Make sure you discuss any questions you have with your health care provider. Document Released: 12/06/2005 Document Revised: 03/28/2016 Document Reviewed: 03/02/2014 Elsevier Interactive Patient Education  2018 [employer]. Liver Biopsy The liver is a large organ in the upper right-hand side of your abdomen. A liver biopsy is a procedure in which a tissue sample is taken from the liver and examined under a microscope. The procedure is done to confirm a suspected problem. There are three types of liver biopsies:  Percutaneous. In this type, an incision is made in your abdomen. The sample is removed through the incision with a needle.  Laparoscopic. In this type, several incisions are made in the abdomen. A tiny camera is passed through one of the incisions to help guide the health care provider. The sample is removed through the other incision or incisions.  Transjugular. In this type, an incision is made in the neck.  A tube is passed through the incision to the liver. The sample is removed through the tube with a needle.  Tell a health care provider about:  Any allergies you have.  All medicines you are taking, including vitamins, herbs, eye drops, creams, and over-the-counter medicines.  Any problems you or family members have had  with anesthetic medicines.  Any blood disorders you have.  Any surgeries you have had.  Any medical conditions you have.  Possibility of pregnancy, if this applies. What are the risks? Generally, this is a safe procedure. However, problems can occur and include:  Bleeding.  Infection.  Bruising.  Collapsed lung.  Leak of digestive juices (bile) from the liver or gallbladder.  Problems with heart rhythm.  Pain at the biopsy site or in the right shoulder.  Low blood pressure (hypotension).  Injury to nearby organs or tissues.  What happens before the procedure?  Your health care provider may do some blood or urine tests. These will help your health care provider learn how well your kidneys and liver are working and how well your blood clots.  Ask your health care provider if you will be able to go home the day of the procedure. Arrange for someone to take you home and stay with you for at least 24 hours.  Do not eat or drink anything after midnight on the night before the procedure or as directed by your health care provider.  Ask your health care provider about: ? Changing or stopping your regular medicines. This is especially important if you are taking diabetes medicines or blood thinners. ? Taking medicines such as aspirin and ibuprofen. These medicines can thin your blood. Do not take these medicines before your procedure if your health care provider asks you not to. What happens during the procedure? Regardless of the type of biopsy that will be done, you will have an IV line placed. Through this line, you will receive fluids and medicine to relax you. If you will be having a laparoscopic biopsy, you may also receive medicine through this line to make you sleep during the procedure (general anesthetic). Percutaneous Liver Biopsy  You will positioned on your back, with your right hand over your head.  A health care provider will locate your liver by tapping and  pressing on the right side of your abdomen or with the help of an ultrasound machine or CT scan.  An area at the bottom of your last right rib will be numbed.  An incision will be made in the numbed area.  The biopsy needle will be inserted into the incision.  Several samples of liver tissue will be taken with the biopsy needle. You will be asked to hold your breath as each sample is taken. Laparoscopic Liver Biopsy  You will be positioned on your back.  Several small incisions will be made in your abdomen.  Your doctor will pass a tiny camera through one incision. The camera will allow the liver to be viewed on a TV monitor in the operating room.  Tools will be passed through the other incision or incisions. These tools will be used to remove samples of liver tissue. Transjugular Liver Biopsy  You will be positioned on your back on an X-ray table, with your head turned to your left.  An area on your neck just over your jugular vein will be numbed.  An incision will be made in the numbed area.  A tiny tube will be inserted through the incision.  It will be pushed through the jugular vein to a blood vessel in the liver called the hepatic vein.  Dye will be inserted through the tube, and X-rays will be taken. The dye will make the blood vessels in the liver light up on the X-rays.  The biopsy needle will be pushed through the tube until it reaches the liver.  Samples of liver tissue will be taken with the biopsy needle.  The needle and the tube will be removed. After the samples are obtained, the incision or incisions will be closed. What happens after the procedure?  You will be taken to a recovery area.  You may have to lie on your right side for 1-2 hours. This will prevent bleeding from the biopsy site.  Your progress will be watched. Your blood pressure, pulse, and the biopsy site will be checked often.  You may have some pain or feel sick. If this happens, tell your  health care provider.  As you begin to feel better, you will be offered ice and beverages.  You may be allowed to go home when the medicines have worn off and you can walk, drink, eat, and use the bathroom. This information is not intended to replace advice given to you by your health care provider. Make sure you discuss any questions you have with your health care provider. Document Released: 01/11/2004 Document Revised: 03/25/2016 Document Reviewed: 12/17/2013 Elsevier Interactive Patient Education  2018 [employer]. Laparoscopic Cholecystectomy Laparoscopic cholecystectomy is surgery to remove the gallbladder. The gallbladder is a pear-shaped organ that lies beneath the liver on the right side of the body. The gallbladder stores bile, which is a fluid that helps the body to digest fats. Cholecystectomy is often done for inflammation of the gallbladder (cholecystitis). This condition is usually caused by a buildup of gallstones (cholelithiasis) in the gallbladder. Gallstones can block the flow of bile, which can result in inflammation and pain. In severe cases, emergency surgery may be required. This procedure is done though small incisions in your abdomen (laparoscopic surgery). A thin scope with a camera (laparoscope) is inserted through one incision. Thin surgical instruments are inserted through the other incisions. In some cases, a laparoscopic procedure may be turned into a type of surgery that is done through a larger incision (open surgery). Tell a health care provider about:  Any allergies you have.  All medicines you are taking, including vitamins, herbs, eye drops, creams, and over-the-counter medicines.  Any problems you or family members have had with anesthetic medicines.  Any blood disorders you have.  Any surgeries you have had.  Any medical conditions you have.  Whether you are pregnant or may be pregnant. What are the risks? Generally, this is a safe procedure.  However, problems may occur, including:  Infection.  Bleeding.  Allergic reactions to medicines.  Damage to other structures or organs.  A stone remaining in the common bile duct. The common bile duct carries bile from the gallbladder into the small intestine.  A bile leak from the cyst duct that is clipped when your gallbladder is removed.  What happens before the procedure? Staying hydrated Follow instructions from your health care provider about hydration, which may include:  Up to 2 hours before the procedure - you may continue to drink clear liquids, such as water, clear fruit juice, black coffee, and plain tea.  Eating and drinking restrictions Follow instructions from your health care provider about eating and drinking, which may include:  8 hours before  the procedure - stop eating heavy meals or foods such as meat, fried foods, or fatty foods.  6 hours before the procedure - stop eating light meals or foods, such as toast or cereal.  6 hours before the procedure - stop drinking milk or drinks that contain milk.  2 hours before the procedure - stop drinking clear liquids.  Medicines  Ask your health care provider about: ? Changing or stopping your regular medicines. This is especially important if you are taking diabetes medicines or blood thinners. ? Taking medicines such as aspirin and ibuprofen. These medicines can thin your blood. Do not take these medicines before your procedure if your health care provider instructs you not to.  You may be given antibiotic medicine to help prevent infection. General instructions  Let your health care provider know if you develop a cold or an infection before surgery.  Plan to have someone take you home from the hospital or clinic.  Ask your health care provider how your surgical site will be marked or identified. What happens during the procedure?  To reduce your risk of infection: ? Your health care team will wash or  sanitize their hands. ? Your skin will be washed with soap. ? Hair may be removed from the surgical area.  An IV tube may be inserted into one of your veins.  You will be given one or more of the following: ? A medicine to help you relax (sedative). ? A medicine to make you fall asleep (general anesthetic).  A breathing tube will be placed in your mouth.  Your surgeon will make several small cuts (incisions) in your abdomen.  The laparoscope will be inserted through one of the small incisions. The camera on the laparoscope will send images to a TV screen (monitor) in the operating room. This lets your surgeon see inside your abdomen.  Air-like gas will be pumped into your abdomen. This will expand your abdomen to give the surgeon more room to perform the surgery.  Other tools that are needed for the procedure will be inserted through the other incisions. The gallbladder will be removed through one of the incisions.  Your common bile duct may be examined. If stones are found in the common bile duct, they may be removed.  After your gallbladder has been removed, the incisions will be closed with stitches (sutures), staples, or skin glue.  Your incisions may be covered with a bandage (dressing). The procedure may vary among health care providers and hospitals. What happens after the procedure?  Your blood pressure, heart rate, breathing rate, and blood oxygen level will be monitored until the medicines you were given have worn off.  You will be given medicines as needed to control your pain.  Do not drive for 24 hours if you were given a sedative. This information is not intended to replace advice given to you by your health care provider. Make sure you discuss any questions you have with your health care provider. Document Released: 10/21/2005 Document Revised: 05/12/2016 Document Reviewed: 04/08/2016 Elsevier Interactive Patient Education  2018 [employer]. Cholelithiasis Cholelithiasis is also called "gallstones." It is a kind of gallbladder disease. The gallbladder is an organ that stores a liquid (bile) that helps you digest fat. Gallstones may not cause symptoms (may be silent gallstones) until they cause a blockage, and then they can cause pain (gallbladder attack). Follow these instructions at home:  Take over-the-counter and prescription medicines only as told by your doctor.  Stay  at a healthy weight.  Eat healthy foods. This includes: ? Eating fewer fatty foods, like fried foods. ? Eating fewer refined carbs (refined carbohydrates). Refined carbs are breads and grains that are highly processed, like white bread and white rice. Instead, choose whole grains like whole-wheat bread and brown rice. ? Eating more fiber. Almonds, fresh fruit, and beans are healthy sources of fiber.  Keep all follow-up visits as told by your doctor. This is important. Contact a doctor if:  You have sudden pain in the upper right side of your belly (abdomen). Pain might spread to your right shoulder or your chest. This may be a sign of a gallbladder attack.  You feel sick to your stomach (are nauseous).  You throw up (vomit).  You have been diagnosed with gallstones that have no symptoms and you get: ? Belly pain. ? Discomfort, burning, or fullness in the upper part of your belly (indigestion). Get help right away if:  You have sudden pain in the upper right side of your belly, and it lasts for more than 2 hours.  You have belly pain that lasts for more than 5 hours.  You have a fever or chills.  You keep feeling sick to your stomach or you keep throwing up.  Your skin or the whites of your eyes turn yellow (jaundice).  You have dark-colored pee (urine).  You have light-colored poop (stool). Summary  Cholelithiasis is also called "gallstones."  The gallbladder is an organ that stores a liquid (bile) that helps you digest fat.  Silent  gallstones are gallstones that do not cause symptoms.  A gallbladder attack may cause sudden pain in the upper right side of your belly. Pain might spread to your right shoulder or your chest. If this happens, contact your doctor.  If you have sudden pain in the upper right side of your belly that lasts for more than 2 hours, get help right away. This information is not intended to replace advice given to you by your health care provider. Make sure you discuss any questions you have with your health care provider. Document Released: 04/08/2008 Document Revised: 07/07/2016 Document Reviewed: 07/07/2016 Elsevier Interactive Patient Education  2017 [employer].

## 2018-01-21 NOTE — H&P (Signed)
[facility] History and Physical  Reason for Referral: Nausea/ vomiting with cholelithiasis  Referring Physician:   Dr. Rehman, Angela Boone, DNP   Chief Complaint    Follow-up      Katherine Bell is a 66 y.o. female.  HPI: Katherine Bell is a very pleasant 66 yo who has complaints of chronic nausea, vomiting, diarrhea, and lower abdominal pain that has been going on and getting worse over the last few months.  She saw me a few weeks ago and given the complexity of her situation and mulitple etiologies for the nausea/vomiting aside from the gallstones, I told her that we needed to rule out other causes before proceeding with surgery.  She was upset by this but compiled.  She was given a Rx for Reglan and set up with a gastric emptying study that demonstrated normal stomach emptying.    In addition, during this time she has not had any issues with her spleen and still is unsure of the etiology and her splenomegaly that accompanied it.  She is under the impression it was from mononucleosis but the Mono spot in the hospital was negative.    In addition, since seeing me she underwent a liver biopsy that demonstrated some fibrosis but Dr. Rehman believes she has more scaring of her liver than these biopsies indicate given her gastropathy on EGD.    She denies any RUQ pain, she denies any symptoms associated with specific foods. She does feel like the bloating, nausea/vomiting is worse with certain foods.   She had a colonoscopy within the last 10 years that was negative. She has been told in the past she had fatty liver.      Past Medical History:  Diagnosis Date  . Acute blood loss as cause of postoperative anemia 08/19/2017  . Anemia of chronic disease 08/19/2017  . Anxiety   . Corneal ulcer, left   . Diabetes mellitus without complication (HCC)   . GERD (gastroesophageal reflux disease)   . History of kidney stones   . Hypercholesteremia   . Hypertension   .  Hypothyroidism   . Osteoarthritis of left knee   . Primary localized osteoarthrosis of the knee, left 08/06/2017  . Sleep apnea, obstructive    used for a year -off for a year    Past Surgical History:  Procedure Laterality Date  . ABDOMINAL HYSTERECTOMY  1996  . BLADDER SUSPENSION  1997   4 different procedures  . CATARACT EXTRACTION W/ INTRAOCULAR LENS IMPLANT Left 2004  . DACROCYSTORHINOSTOMY W/ JONES TUBE Left 2004   for tear duct  . ESOPHAGOGASTRODUODENOSCOPY N/A 11/21/2017   Procedure: ESOPHAGOGASTRODUODENOSCOPY (EGD);  Surgeon: Rehman, Najeeb U, MD;  Location: [facility];  Service: Endoscopy;  Laterality: N/A;  225  . EYE SURGERY    . TOTAL KNEE ARTHROPLASTY Left 08/18/2017   Procedure: LEFT TOTAL KNEE ARTHROPLASTY;  Surgeon: Wainer, Robert, MD;  Location: [facility] OR;  Service: Orthopedics;  Laterality: Left;    Family History  Problem Relation Age of Onset  . Suicidality Father   . Diabetes Brother   . Hypertension Brother   . Heart disease Brother     Social History   Tobacco Use  . Smoking status: Never Smoker  . Smokeless tobacco: Never Used  Substance Use Topics  . Alcohol use: No  . Drug use: No    Medications: I have reviewed the patient's current medications. Prior to Admission:  (Not in a hospital admission) Allergies as of 01/20/2018        Reactions   Penicillins Hives, Itching, Other (See Comments)   Has taken keflex without difficulty      Has patient had a PCN reaction causing immediate rash, facial/tongue/throat swelling, SOB or lightheadedness with hypotension: Yes Has patient had a PCN reaction causing severe rash involving mucus membranes or skin necrosis: No  Has patient had a PCN reaction that required hospitalization: No Has patient had a PCN reaction occurring within the last 10 years: No If all of the above answers are "NO", then may proceed with Cephalosporin use.   Sulfa Antibiotics Hives      Medication List        Accurate as of  01/20/18  9:11 AM. Always use your most recent med list.          acetaminophen 325 MG tablet Commonly known as:  TYLENOL Take 2 tablets (650 mg total) by mouth every 6 (six) hours as needed for mild pain (or Fever >/= 101).   acyclovir 400 MG tablet Commonly known as:  ZOVIRAX Take 400 mg by mouth 2 (two) times daily.   atorvastatin 40 MG tablet Commonly known as:  LIPITOR Take 20 mg by mouth daily.   diclofenac sodium 1 % Gel Commonly known as:  VOLTAREN Apply 1 application topically daily as needed (for pain).   fexofenadine 180 MG tablet Commonly known as:  ALLEGRA Take 180 mg by mouth daily as needed for allergies.   glipiZIDE 5 MG tablet Commonly known as:  GLUCOTROL Take 5 mg by mouth 2 (two) times daily before a meal.   hydrochlorothiazide 25 MG tablet Commonly known as:  HYDRODIURIL Take 25 mg by mouth daily.   levothyroxine 100 MCG tablet Commonly known as:  SYNTHROID, LEVOTHROID Take 100 mcg by mouth daily before breakfast.   losartan 50 MG tablet Commonly known as:  COZAAR Take 50 mg by mouth daily.   metFORMIN 1000 MG tablet Commonly known as:  GLUCOPHAGE Take 1,000 mg by mouth 2 (two) times daily with a meal.   metoCLOPramide 5 MG tablet Commonly known as:  REGLAN Take 1-2 tablets (5-10 mg total) by mouth 3 (three) times daily before meals. Start with 5mg and if no improvement after 2 days, then increase to 10mg. Do not take this medication for 72 hours prior to your gastric emptying study.   pantoprazole 40 MG tablet Commonly known as:  PROTONIX Take 1 tablet (40 mg total) by mouth daily before breakfast.   sertraline 100 MG tablet Commonly known as:  ZOLOFT Take 100 mg by mouth 2 (two) times daily.   VISION FORMULA PO Take 1 capsule by mouth daily.        ROS:  A comprehensive review of systems was negative except for: Gastrointestinal: positive for diarrhea, nausea and vomiting  Blood pressure (!) 153/88, pulse 98, temperature (!)  97.1 F (36.2 C), resp. rate 18, height 5' 7" (1.702 m), weight 184 lb (83.5 kg). Physical Exam  Constitutional: She is oriented to person, place, and time and well-developed, well-nourished, and in no distress.  HENT:  Head: Normocephalic.  Eyes: Pupils are equal, round, and reactive to light.  Neck: Normal range of motion.  Cardiovascular: Normal rate and regular rhythm.  Pulmonary/Chest: Effort normal and breath sounds normal.  Abdominal: Soft. She exhibits no distension. There is no tenderness.  Spleen is less palpable compared to last exam, no masses appreciated   Neurological: She is alert and oriented to person, place, and time.  Skin: Skin is warm and dry.    No telangectasia, or varices on abdomen noted   Psychiatric: Mood, memory, affect and judgment normal.  Vitals reviewed.   Results: Personally reviewed imaging-  [place] 67/0110- stones, uncomplicated, CBD 0.3EJ, fatty liver, splenomegaly  CT a/p 11/2017- Splenomegaly to 20 cm, portal vein measuring 14cm, food in stomach, nondistended gallbladder   Bone marrow biopsy- negative  Mono Screen- negative   Gastric emptying study- normal   Assessment & Plan:  Katherine Bell is a 67 y.o. female with chronic nausea/vomiting and bloating and diarrhea.  She has attempted the trial of reglan and did the gastric emptying study and this was negative. Her spleen also seems to be decreased in size.  She would like to have her gallbladder removed in order to rule this out as an etiology for the nausea/vomiting.   -We have discussed extensively that this may not help her at all but will get the question of the gallbladder off the table -We have discussed that she could continue to have nausea/vomiting and that the diarrhea will likely get worse -We have discussed her increased risk of bleeding given the history of pancytopenia and that we will check labs, she notes no new or recent issues with bleeding, and we have discussed that the  spleen itself could cause issues with bleeding  -We have discussed crossing her for blood products and she is agrees to receiving them if needed  -We have discussed the possible need for an open procedure   PLAN: I counseled the patient about the indication, risks and benefits of laparoscopic cholecystectomy and liver biopsy.  She understands there is a very small chance for bleeding, infection, injury to normal structures (including common bile duct), conversion to open surgery, persistent symptoms, evolution of postcholecystectomy diarrhea, need for secondary interventions, anesthesia reaction, cardiopulmonary issues and other risks not specifically detailed here or above. I described the expected recovery, the plan for follow-up and the restrictions during the recovery phase.  All questions were answered.   All questions were answered to the satisfaction of the patient and family.   Virl Cagey 01/20/2018, 9:11 AM

## 2018-01-22 DIAGNOSIS — E119 Type 2 diabetes mellitus without complications: Secondary | ICD-10-CM | POA: Diagnosis not present

## 2018-01-22 DIAGNOSIS — I10 Essential (primary) hypertension: Secondary | ICD-10-CM | POA: Diagnosis not present

## 2018-01-22 DIAGNOSIS — E7800 Pure hypercholesterolemia, unspecified: Secondary | ICD-10-CM | POA: Diagnosis not present

## 2018-01-23 NOTE — Patient Instructions (Signed)
Katherine Bell  01/23/2018     @PREFPERIOPPHARMACY @   Your procedure is scheduled on  01/30/2018 .  Report to [facility] at  615   A.M.  Call this number if you have problems the morning of surgery:  [phone]   Remember:  Do not eat food or drink liquids after midnight.  Take these medicines the morning of surgery with A SIP OF WATER  Zovirax, allegra, levothyroxine, losartan, protonix, zoloft.   Do not wear jewelry, make-up or nail polish.  Do not wear lotions, powders, or perfumes, or deodorant.  Do not shave 48 hours prior to surgery.  Men may shave face and neck.  Do not bring valuables to the hospital.  [facility] is not responsible for any belongings or valuables.  Contacts, dentures or bridgework may not be worn into surgery.  Leave your suitcase in the car.  After surgery it may be brought to your room.  For patients admitted to the hospital, discharge time will be determined by your treatment team.  Patients discharged the day of surgery will not be allowed to drive home.   Name and phone number of your driver:   family Special instructions:  None  Please read over the following fact sheets that you were given. Anesthesia Post-op Instructions and Care and Recovery After Surgery       Laparoscopic Cholecystectomy Laparoscopic cholecystectomy is surgery to remove the gallbladder. The gallbladder is a pear-shaped organ that lies beneath the liver on the right side of the body. The gallbladder stores bile, which is a fluid that helps the body to digest fats. Cholecystectomy is often done for inflammation of the gallbladder (cholecystitis). This condition is usually caused by a buildup of gallstones (cholelithiasis) in the gallbladder. Gallstones can block the flow of bile, which can result in inflammation and pain. In severe cases, emergency surgery may be required. This procedure is done though small incisions in your abdomen (laparoscopic  surgery). A thin scope with a camera (laparoscope) is inserted through one incision. Thin surgical instruments are inserted through the other incisions. In some cases, a laparoscopic procedure may be turned into a type of surgery that is done through a larger incision (open surgery). Tell a health care provider about:  Any allergies you have.  All medicines you are taking, including vitamins, herbs, eye drops, creams, and over-the-counter medicines.  Any problems you or family members have had with anesthetic medicines.  Any blood disorders you have.  Any surgeries you have had.  Any medical conditions you have.  Whether you are pregnant or may be pregnant. What are the risks? Generally, this is a safe procedure. However, problems may occur, including:  Infection.  Bleeding.  Allergic reactions to medicines.  Damage to other structures or organs.  A stone remaining in the common bile duct. The common bile duct carries bile from the gallbladder into the small intestine.  A bile leak from the cyst duct that is clipped when your gallbladder is removed.  What happens before the procedure? Staying hydrated Follow instructions from your health care provider about hydration, which may include:  Up to 2 hours before the procedure - you may continue to drink clear liquids, such as water, clear fruit juice, black coffee, and plain tea.  Eating and drinking restrictions Follow instructions from your health care provider about eating and drinking, which may include:  8 hours before the procedure -  stop eating heavy meals or foods such as meat, fried foods, or fatty foods.  6 hours before the procedure - stop eating light meals or foods, such as toast or cereal.  6 hours before the procedure - stop drinking milk or drinks that contain milk.  2 hours before the procedure - stop drinking clear liquids.  Medicines  Ask your health care provider about: ? Changing or stopping your  regular medicines. This is especially important if you are taking diabetes medicines or blood thinners. ? Taking medicines such as aspirin and ibuprofen. These medicines can thin your blood. Do not take these medicines before your procedure if your health care provider instructs you not to.  You may be given antibiotic medicine to help prevent infection. General instructions  Let your health care provider know if you develop a cold or an infection before surgery.  Plan to have someone take you home from the hospital or clinic.  Ask your health care provider how your surgical site will be marked or identified. What happens during the procedure?  To reduce your risk of infection: ? Your health care team will wash or sanitize their hands. ? Your skin will be washed with soap. ? Hair may be removed from the surgical area.  An IV tube may be inserted into one of your veins.  You will be given one or more of the following: ? A medicine to help you relax (sedative). ? A medicine to make you fall asleep (general anesthetic).  A breathing tube will be placed in your mouth.  Your surgeon will make several small cuts (incisions) in your abdomen.  The laparoscope will be inserted through one of the small incisions. The camera on the laparoscope will send images to a TV screen (monitor) in the operating room. This lets your surgeon see inside your abdomen.  Air-like gas will be pumped into your abdomen. This will expand your abdomen to give the surgeon more room to perform the surgery.  Other tools that are needed for the procedure will be inserted through the other incisions. The gallbladder will be removed through one of the incisions.  Your common bile duct may be examined. If stones are found in the common bile duct, they may be removed.  After your gallbladder has been removed, the incisions will be closed with stitches (sutures), staples, or skin glue.  Your incisions may be covered  with a bandage (dressing). The procedure may vary among health care providers and hospitals. What happens after the procedure?  Your blood pressure, heart rate, breathing rate, and blood oxygen level will be monitored until the medicines you were given have worn off.  You will be given medicines as needed to control your pain.  Do not drive for 24 hours if you were given a sedative. This information is not intended to replace advice given to you by your health care provider. Make sure you discuss any questions you have with your health care provider. Document Released: 10/21/2005 Document Revised: 05/12/2016 Document Reviewed: 04/08/2016 Elsevier Interactive Patient Education  2018 [employer].  Laparoscopic Cholecystectomy, Care After This sheet gives you information about how to care for yourself after your procedure. Your health care provider may also give you more specific instructions. If you have problems or questions, contact your health care provider. What can I expect after the procedure? After the procedure, it is common to have:  Pain at your incision sites. You will be given medicines to control this pain.  Mild nausea or vomiting.  Bloating and possible shoulder pain from the air-like gas that was used during the procedure.  Follow these instructions at home: Incision care   Follow instructions from your health care provider about how to take care of your incisions. Make sure you: ? Wash your hands with soap and water before you change your bandage (dressing). If soap and water are not available, use hand sanitizer. ? Change your dressing as told by your health care provider. ? Leave stitches (sutures), skin glue, or adhesive strips in place. These skin closures may need to be in place for 2 weeks or longer. If adhesive strip edges start to loosen and curl up, you may trim the loose edges. Do not remove adhesive strips completely unless your health care provider tells  you to do that.  Do not take baths, swim, or use a hot tub until your health care provider approves. Ask your health care provider if you can take showers. You may only be allowed to take sponge baths for bathing.  Check your incision area every day for signs of infection. Check for: ? More redness, swelling, or pain. ? More fluid or blood. ? Warmth. ? Pus or a bad smell. Activity  Do not drive or use heavy machinery while taking prescription pain medicine.  Do not lift anything that is heavier than 10 lb (4.5 kg) until your health care provider approves.  Do not play contact sports until your health care provider approves.  Do not drive for 24 hours if you were given a medicine to help you relax (sedative).  Rest as needed. Do not return to work or school until your health care provider approves. General instructions  Take over-the-counter and prescription medicines only as told by your health care provider.  To prevent or treat constipation while you are taking prescription pain medicine, your health care provider may recommend that you: ? Drink enough fluid to keep your urine clear or pale yellow. ? Take over-the-counter or prescription medicines. ? Eat foods that are high in fiber, such as fresh fruits and vegetables, whole grains, and beans. ? Limit foods that are high in fat and processed sugars, such as fried and sweet foods. Contact a health care provider if:  You develop a rash.  You have more redness, swelling, or pain around your incisions.  You have more fluid or blood coming from your incisions.  Your incisions feel warm to the touch.  You have pus or a bad smell coming from your incisions.  You have a fever.  One or more of your incisions breaks open. Get help right away if:  You have trouble breathing.  You have chest pain.  You have increasing pain in your shoulders.  You faint or feel dizzy when you stand.  You have severe pain in your  abdomen.  You have nausea or vomiting that lasts for more than one day.  You have leg pain. This information is not intended to replace advice given to you by your health care provider. Make sure you discuss any questions you have with your health care provider. Document Released: 10/21/2005 Document Revised: 05/11/2016 Document Reviewed: 04/08/2016 Elsevier Interactive Patient Education  2018 [employer].  Liver Biopsy The liver is a large organ in the upper right-hand side of your abdomen. A liver biopsy is a procedure in which a tissue sample is taken from the liver and examined under a microscope. The procedure is done to confirm a suspected problem.  There are three types of liver biopsies:  Percutaneous. In this type, an incision is made in your abdomen. The sample is removed through the incision with a needle.  Laparoscopic. In this type, several incisions are made in the abdomen. A tiny camera is passed through one of the incisions to help guide the health care provider. The sample is removed through the other incision or incisions.  Transjugular. In this type, an incision is made in the neck. A tube is passed through the incision to the liver. The sample is removed through the tube with a needle.  Tell a health care provider about:  Any allergies you have.  All medicines you are taking, including vitamins, herbs, eye drops, creams, and over-the-counter medicines.  Any problems you or family members have had with anesthetic medicines.  Any blood disorders you have.  Any surgeries you have had.  Any medical conditions you have.  Possibility of pregnancy, if this applies. What are the risks? Generally, this is a safe procedure. However, problems can occur and include:  Bleeding.  Infection.  Bruising.  Collapsed lung.  Leak of digestive juices (bile) from the liver or gallbladder.  Problems with heart rhythm.  Pain at the biopsy site or in the right  shoulder.  Low blood pressure (hypotension).  Injury to nearby organs or tissues.  What happens before the procedure?  Your health care provider may do some blood or urine tests. These will help your health care provider learn how well your kidneys and liver are working and how well your blood clots.  Ask your health care provider if you will be able to go home the day of the procedure. Arrange for someone to take you home and stay with you for at least 24 hours.  Do not eat or drink anything after midnight on the night before the procedure or as directed by your health care provider.  Ask your health care provider about: ? Changing or stopping your regular medicines. This is especially important if you are taking diabetes medicines or blood thinners. ? Taking medicines such as aspirin and ibuprofen. These medicines can thin your blood. Do not take these medicines before your procedure if your health care provider asks you not to. What happens during the procedure? Regardless of the type of biopsy that will be done, you will have an IV line placed. Through this line, you will receive fluids and medicine to relax you. If you will be having a laparoscopic biopsy, you may also receive medicine through this line to make you sleep during the procedure (general anesthetic). Percutaneous Liver Biopsy  You will positioned on your back, with your right hand over your head.  A health care provider will locate your liver by tapping and pressing on the right side of your abdomen or with the help of an ultrasound machine or CT scan.  An area at the bottom of your last right rib will be numbed.  An incision will be made in the numbed area.  The biopsy needle will be inserted into the incision.  Several samples of liver tissue will be taken with the biopsy needle. You will be asked to hold your breath as each sample is taken. Laparoscopic Liver Biopsy  You will be positioned on your  back.  Several small incisions will be made in your abdomen.  Your doctor will pass a tiny camera through one incision. The camera will allow the liver to be viewed on a TV monitor in the  operating room.  Tools will be passed through the other incision or incisions. These tools will be used to remove samples of liver tissue. Transjugular Liver Biopsy  You will be positioned on your back on an X-ray table, with your head turned to your left.  An area on your neck just over your jugular vein will be numbed.  An incision will be made in the numbed area.  A tiny tube will be inserted through the incision. It will be pushed through the jugular vein to a blood vessel in the liver called the hepatic vein.  Dye will be inserted through the tube, and X-rays will be taken. The dye will make the blood vessels in the liver light up on the X-rays.  The biopsy needle will be pushed through the tube until it reaches the liver.  Samples of liver tissue will be taken with the biopsy needle.  The needle and the tube will be removed. After the samples are obtained, the incision or incisions will be closed. What happens after the procedure?  You will be taken to a recovery area.  You may have to lie on your right side for 1-2 hours. This will prevent bleeding from the biopsy site.  Your progress will be watched. Your blood pressure, pulse, and the biopsy site will be checked often.  You may have some pain or feel sick. If this happens, tell your health care provider.  As you begin to feel better, you will be offered ice and beverages.  You may be allowed to go home when the medicines have worn off and you can walk, drink, eat, and use the bathroom. This information is not intended to replace advice given to you by your health care provider. Make sure you discuss any questions you have with your health care provider. Document Released: 01/11/2004 Document Revised: 03/25/2016 Document Reviewed:  12/17/2013 Elsevier Interactive Patient Education  2018 [employer].  Liver Biopsy, Care After These instructions give you information on caring for yourself after your procedure. Your doctor may also give you more specific instructions. Call your doctor if you have any problems or questions after your procedure. Follow these instructions at home:  Rest at home for 1-2 days or as told by your doctor.  Have someone stay with you for at least 24 hours.  Do not do these things in the first 24 hours: ? Drive. ? Use machinery. ? Take care of other people. ? Sign legal documents. ? Take a bath or shower.  There are many different ways to close and cover a cut (incision). For example, a cut can be closed with stitches, skin glue, or adhesive strips. Follow your doctor's instructions on: ? Taking care of your cut. ? Changing and removing your bandage (dressing). ? Removing whatever was used to close your cut.  Do not drink alcohol in the first week.  Do not lift more than 5 pounds or play contact sports for the first 2 weeks.  Take medicines only as told by your doctor. For 1 week, do not take medicine that has aspirin in it or medicines like ibuprofen.  Get your test results. Contact a doctor if:  A cut bleeds and leaves more than just a small spot of blood.  A cut is red, puffs up (swells), or hurts more than before.  Fluid or something else comes from a cut.  A cut smells bad.  You have a fever or chills. Get help right away if:  You have  swelling, bloating, or pain in your belly (abdomen).  You get dizzy or faint.  You have a rash.  You feel sick to your stomach (nauseous) or throw up (vomit).  You have trouble breathing, feel short of breath, or feel faint.  Your chest hurts.  You have problems talking or seeing.  You have trouble balancing or moving your arms or legs. This information is not intended to replace advice given to you by your health care  provider. Make sure you discuss any questions you have with your health care provider. Document Released: 07/30/2008 Document Revised: 03/28/2016 Document Reviewed: 12/17/2013 Elsevier Interactive Patient Education  2018 [employer] Anesthesia, Adult General anesthesia is the use of medicines to make a person "go to sleep" (be unconscious) for a medical procedure. General anesthesia is often recommended when a procedure:  Is long.  Requires you to be still or in an unusual position.  Is major and can cause you to lose blood.  Is impossible to do without general anesthesia.  The medicines used for general anesthesia are called general anesthetics. In addition to making you sleep, the medicines:  Prevent pain.  Control your blood pressure.  Relax your muscles.  Tell a health care provider about:  Any allergies you have.  All medicines you are taking, including vitamins, herbs, eye drops, creams, and over-the-counter medicines.  Any problems you or family members have had with anesthetic medicines.  Types of anesthetics you have had in the past.  Any bleeding disorders you have.  Any surgeries you have had.  Any medical conditions you have.  Any history of heart or lung conditions, such as heart failure, sleep apnea, or chronic obstructive pulmonary disease (COPD).  Whether you are pregnant or may be pregnant.  Whether you use tobacco, alcohol, marijuana, or street drugs.  Any history of Armed forces logistics/support/administrative officer.  Any history of depression or anxiety. What are the risks? Generally, this is a safe procedure. However, problems may occur, including:  Allergic reaction to anesthetics.  Lung and heart problems.  Inhaling food or liquids from your stomach into your lungs (aspiration).  Injury to nerves.  Waking up during your procedure and being unable to move (rare).  Extreme agitation or a state of mental confusion (delirium) when you wake up from the  anesthetic.  Air in the bloodstream, which can lead to stroke.  These problems are more likely to develop if you are having a major surgery or if you have an advanced medical condition. You can prevent some of these complications by answering all of your health care provider's questions thoroughly and by following all pre-procedure instructions. General anesthesia can cause side effects, including:  Nausea or vomiting  A sore throat from the breathing tube.  Feeling cold or shivery.  Feeling tired, washed out, or achy.  Sleepiness or drowsiness.  Confusion or agitation.  What happens before the procedure? Staying hydrated Follow instructions from your health care provider about hydration, which may include:  Up to 2 hours before the procedure - you may continue to drink clear liquids, such as water, clear fruit juice, black coffee, and plain tea.  Eating and drinking restrictions Follow instructions from your health care provider about eating and drinking, which may include:  8 hours before the procedure - stop eating heavy meals or foods such as meat, fried foods, or fatty foods.  6 hours before the procedure - stop eating light meals or foods, such as toast or cereal.  6 hours  before the procedure - stop drinking milk or drinks that contain milk.  2 hours before the procedure - stop drinking clear liquids.  Medicines  Ask your health care provider about: ? Changing or stopping your regular medicines. This is especially important if you are taking diabetes medicines or blood thinners. ? Taking medicines such as aspirin and ibuprofen. These medicines can thin your blood. Do not take these medicines before your procedure if your health care provider instructs you not to. ? Taking new dietary supplements or medicines. Do not take these during the week before your procedure unless your health care provider approves them.  If you are told to take a medicine or to continue  taking a medicine on the day of the procedure, take the medicine with sips of water. General instructions   Ask if you will be going home the same day, the following day, or after a longer hospital stay. ? Plan to have someone take you home. ? Plan to have someone stay with you for the first 24 hours after you leave the hospital or clinic.  For 3-6 weeks before the procedure, try not to use any tobacco products, such as cigarettes, chewing tobacco, and e-cigarettes.  You may brush your teeth on the morning of the procedure, but make sure to spit out the toothpaste. What happens during the procedure?  You will be given anesthetics through a mask and through an IV tube in one of your veins.  You may receive medicine to help you relax (sedative).  As soon as you are asleep, a breathing tube may be used to help you breathe.  An anesthesia specialist will stay with you throughout the procedure. He or she will help keep you comfortable and safe by continuing to give you medicines and adjusting the amount of medicine that you get. He or she will also watch your blood pressure, pulse, and oxygen levels to make sure that the anesthetics do not cause any problems.  If a breathing tube was used to help you breathe, it will be removed before you wake up. The procedure may vary among health care providers and hospitals. What happens after the procedure?  You will wake up, often slowly, after the procedure is complete, usually in a recovery area.  Your blood pressure, heart rate, breathing rate, and blood oxygen level will be monitored until the medicines you were given have worn off.  You may be given medicine to help you calm down if you feel anxious or agitated.  If you will be going home the same day, your health care provider may check to make sure you can stand, drink, and urinate.  Your health care providers will treat your pain and side effects before you go home.  Do not drive for 24  hours if you received a sedative.  You may: ? Feel nauseous and vomit. ? Have a sore throat. ? Have mental slowness. ? Feel cold or shivery. ? Feel sleepy. ? Feel tired. ? Feel sore or achy, even in parts of your body where you did not have surgery. This information is not intended to replace advice given to you by your health care provider. Make sure you discuss any questions you have with your health care provider. Document Released: 01/28/2008 Document Revised: 04/02/2016 Document Reviewed: 10/05/2015 Elsevier Interactive Patient Education  2018 [place] Anesthesia, Adult, Care After These instructions provide you with information about caring for yourself after your procedure. Your health care provider may also  give you more specific instructions. Your treatment has been planned according to current medical practices, but problems sometimes occur. Call your health care provider if you have any problems or questions after your procedure. What can I expect after the procedure? After the procedure, it is common to have:  Vomiting.  A sore throat.  Mental slowness.  It is common to feel:  Nauseous.  Cold or shivery.  Sleepy.  Tired.  Sore or achy, even in parts of your body where you did not have surgery.  Follow these instructions at home: For at least 24 hours after the procedure:  Do not: ? Participate in activities where you could fall or become injured. ? Drive. ? Use heavy machinery. ? Drink alcohol. ? Take sleeping pills or medicines that cause drowsiness. ? Make important decisions or sign legal documents. ? Take care of children on your own.  Rest. Eating and drinking  If you vomit, drink water, juice, or soup when you can drink without vomiting.  Drink enough fluid to keep your urine clear or pale yellow.  Make sure you have little or no nausea before eating solid foods.  Follow the diet recommended by your health care  provider. General instructions  Have a responsible adult stay with you until you are awake and alert.  Return to your normal activities as told by your health care provider. Ask your health care provider what activities are safe for you.  Take over-the-counter and prescription medicines only as told by your health care provider.  If you smoke, do not smoke without supervision.  Keep all follow-up visits as told by your health care provider. This is important. Contact a health care provider if:  You continue to have nausea or vomiting at home, and medicines are not helpful.  You cannot drink fluids or start eating again.  You cannot urinate after 8-12 hours.  You develop a skin rash.  You have fever.  You have increasing redness at the site of your procedure. Get help right away if:  You have difficulty breathing.  You have chest pain.  You have unexpected bleeding.  You feel that you are having a life-threatening or urgent problem. This information is not intended to replace advice given to you by your health care provider. Make sure you discuss any questions you have with your health care provider. Document Released: 01/27/2001 Document Revised: 03/25/2016 Document Reviewed: 10/05/2015 Elsevier Interactive Patient Education  [employer].

## 2018-01-27 ENCOUNTER — Other Ambulatory Visit: Payer: Self-pay

## 2018-01-27 ENCOUNTER — Encounter (HOSPITAL_COMMUNITY)
Admission: RE | Admit: 2018-01-27 | Discharge: 2018-01-27 | Disposition: A | Discharge status: Home / Self Care | Payer: Medicare Other | Source: Ambulatory Visit | Attending: General Surgery | Admitting: General Surgery

## 2018-01-27 ENCOUNTER — Encounter (INDEPENDENT_AMBULATORY_CARE_PROVIDER_SITE_OTHER): Payer: Self-pay | Admitting: Internal Medicine

## 2018-01-27 ENCOUNTER — Encounter (HOSPITAL_COMMUNITY): Payer: Self-pay

## 2018-01-27 ENCOUNTER — Ambulatory Visit (INDEPENDENT_AMBULATORY_CARE_PROVIDER_SITE_OTHER): Payer: Medicare Other | Admitting: Internal Medicine

## 2018-01-27 VITALS — BP 122/70 | HR 76 | Temp 98.5°F | Resp 18 | Ht 67.0 in | Wt 184.5 lb

## 2018-01-27 DIAGNOSIS — D6959 Other secondary thrombocytopenia: Secondary | ICD-10-CM | POA: Diagnosis not present

## 2018-01-27 DIAGNOSIS — Z01818 Encounter for other preprocedural examination: Secondary | ICD-10-CM

## 2018-01-27 DIAGNOSIS — K7400 Hepatic fibrosis, unspecified: Secondary | ICD-10-CM

## 2018-01-27 DIAGNOSIS — G8918 Other acute postprocedural pain: Secondary | ICD-10-CM | POA: Diagnosis not present

## 2018-01-27 DIAGNOSIS — K8020 Calculus of gallbladder without cholecystitis without obstruction: Secondary | ICD-10-CM | POA: Diagnosis not present

## 2018-01-27 DIAGNOSIS — J9811 Atelectasis: Secondary | ICD-10-CM | POA: Diagnosis not present

## 2018-01-27 DIAGNOSIS — J181 Lobar pneumonia, unspecified organism: Secondary | ICD-10-CM | POA: Diagnosis not present

## 2018-01-27 DIAGNOSIS — M25511 Pain in right shoulder: Secondary | ICD-10-CM | POA: Diagnosis not present

## 2018-01-27 DIAGNOSIS — R0902 Hypoxemia: Secondary | ICD-10-CM | POA: Diagnosis not present

## 2018-01-27 DIAGNOSIS — F329 Major depressive disorder, single episode, unspecified: Secondary | ICD-10-CM | POA: Diagnosis not present

## 2018-01-27 DIAGNOSIS — M1712 Unilateral primary osteoarthritis, left knee: Secondary | ICD-10-CM | POA: Diagnosis not present

## 2018-01-27 HISTORY — DX: Unspecified cirrhosis of liver: K74.60

## 2018-01-27 LAB — COMPREHENSIVE METABOLIC PANEL
ALBUMIN: 4.7 g/dL (ref 3.5–5.0)
ALT: 28 U/L (ref 14–54)
AST: 33 U/L (ref 15–41)
Alkaline Phosphatase: 105 U/L (ref 38–126)
Anion gap: 13 (ref 5–15)
BUN: 17 mg/dL (ref 6–20)
CHLORIDE: 97 mmol/L — AB (ref 101–111)
CO2: 28 mmol/L (ref 22–32)
CREATININE: 0.79 mg/dL (ref 0.44–1.00)
Calcium: 9.7 mg/dL (ref 8.9–10.3)
GFR calc non Af Amer: >60 mL/min (ref >60)
GLUCOSE: 218 mg/dL — AB (ref 65–99)
Potassium: 4 mmol/L (ref 3.5–5.1)
SODIUM: 138 mmol/L (ref 135–145)
Total Bilirubin: 0.9 mg/dL (ref 0.3–1.2)
Total Protein: 8.1 g/dL (ref 6.5–8.1)

## 2018-01-27 LAB — PREPARE RBC (CROSSMATCH)

## 2018-01-27 LAB — CBC WITH DIFFERENTIAL/PLATELET
Basophils Absolute: 0 10*3/uL (ref 0.0–0.1)
Basophils Relative: 0 %
EOS ABS: 0 10*3/uL (ref 0.0–0.7)
Eosinophils Relative: 0 %
HEMATOCRIT: 39.8 % (ref 36.0–46.0)
HEMOGLOBIN: 12.8 g/dL (ref 12.0–15.0)
LYMPHS ABS: 0.4 10*3/uL — AB (ref 0.7–4.0)
Lymphocytes Relative: 12 %
MCH: 23.9 pg — AB (ref 26.0–34.0)
MCHC: 32.2 g/dL (ref 30.0–36.0)
MCV: 74.3 fL — ABNORMAL LOW (ref 78.0–100.0)
MONOS PCT: 5 %
Monocytes Absolute: 0.2 10*3/uL (ref 0.1–1.0)
NEUTROS PCT: 83 %
Neutro Abs: 3.2 10*3/uL (ref 1.7–7.7)
Platelets: 126 10*3/uL — ABNORMAL LOW (ref 150–400)
RBC: 5.36 MIL/uL — ABNORMAL HIGH (ref 3.87–5.11)
RDW: 15.3 % (ref 11.5–15.5)
WBC: 3.8 10*3/uL — ABNORMAL LOW (ref 4.0–10.5)

## 2018-01-27 LAB — GLUCOSE, CAPILLARY: GLUCOSE-CAPILLARY: 203 mg/dL — AB (ref 65–99)

## 2018-01-27 LAB — PROTIME-INR
INR: 1.11
Prothrombin Time: 14.2 seconds (ref 11.4–15.2)

## 2018-01-27 LAB — ABO/RH: ABO/RH(D): B POS

## 2018-01-27 LAB — HEMOGLOBIN A1C
Hgb A1c MFr Bld: 6.5 % — ABNORMAL HIGH (ref 4.8–5.6)
Mean Plasma Glucose: 139.85 mg/dL

## 2018-01-27 NOTE — Patient Instructions (Signed)
Next office visit about 4-6 weeks after gallbladder surgery.

## 2018-01-27 NOTE — Progress Notes (Signed)
Presenting complaint;  Follow-up for nausea and vomiting. Chronic liver disease.  Database and subjective:  Patient is 67 year old Caucasian female who was last seen on 11/10/2017 for nausea vomiting and weight loss.  She was also noted to have thrombocytopenia and splenomegaly and therefore suspected to have cirrhosis.  US revealed cholelithiasis.  She has history of fatty liver. Underwent esophagogastroduodenoscopy on 11/21/2017 revealed portal hypertensive gastropathy but no evidence of peptic ulcer disease. She underwent normal CT with contrast on 11/24/2017 revealing very large spleen and cholelithiasis but no evidence of pancreatic abnormalities. I therefore recommended cholecystectomy and liver biopsy at the time of surgery.  She was seen by Dr. Blake Divine who recommended liver biopsy.  She underwent ultrasound-guided liver biopsy on 12/19/2017.  Biopsy revealed mildly active steatohepatitis and fibrosis was stage I-II.  Biopsy did not confirm cirrhosis.  She is scheduled for cholecystectomy on 01/30/2018.  Patient states her symptoms have not changed.  She has nausea and vomiting every day.  The symptoms occur after meals.  She has good appetite but she is afraid to eat.  She also has frequent burping.  She feels heartburn is well controlled with PPI.  She feels the best in the morning before she eats or drinks anything.  She has gained 4 pounds since her last visit.  She previously lost over 20 pounds. Patient forgot to tell me on her last visit that she had infectious mononucleosis in October 2018 when she had knee surgery.  She was told her enlarged spleen was due to infectious mononucleosis.  She feels her spleen size has decreased.   Current Medications: Outpatient Encounter Medications as of 01/27/2018  Medication Sig  . acetaminophen (TYLENOL) 325 MG tablet Take 2 tablets (650 mg total) by mouth every 6 (six) hours as needed for mild pain (or Fever >/= 101). (Patient taking  differently: Take 650 mg by mouth at bedtime as needed for mild pain (or Fever >/= 101). )  . acyclovir (ZOVIRAX) 400 MG tablet Take 400 mg by mouth 2 (two) times daily.  Marland Kitchen atorvastatin (LIPITOR) 40 MG tablet Take 20 mg by mouth daily.   Marland Kitchen glipiZIDE (GLUCOTROL) 5 MG tablet Take 5 mg by mouth 2 (two) times daily before a meal.  . hydrochlorothiazide (HYDRODIURIL) 25 MG tablet Take 25 mg by mouth daily.  Marland Kitchen levothyroxine (SYNTHROID, LEVOTHROID) 100 MCG tablet Take 100 mcg by mouth daily before breakfast.  . losartan (COZAAR) 50 MG tablet Take 50 mg by mouth daily.   . metFORMIN (GLUCOPHAGE) 1000 MG tablet Take 1,000 mg by mouth 2 (two) times daily with a meal.  . pantoprazole (PROTONIX) 40 MG tablet Take 1 tablet (40 mg total) by mouth daily before breakfast.  . prednisoLONE acetate (PRED FORTE) 1 % ophthalmic suspension Place 1 drop into the left eye as needed (eye ulcer flare).  . sertraline (ZOLOFT) 100 MG tablet Take 100 mg by mouth 2 (two) times daily.  Marland Kitchen trifluridine (VIROPTIC) 1 % ophthalmic solution Place 1 drop into the left eye as needed (eye ulcer flare).  . [DISCONTINUED] fexofenadine (ALLEGRA) 180 MG tablet Take 180 mg by mouth daily as needed for allergies.   . [DISCONTINUED] metoCLOPramide (REGLAN) 5 MG tablet Take 1-2 tablets (5-10 mg total) by mouth 3 (three) times daily before meals. Start with 5mg  and if no improvement after 2 days, then increase to 10mg . Do not take this medication for 72 hours prior to your gastric emptying study. (Patient not taking: Reported on 01/02/2018)  . [DISCONTINUED]  Multiple Vitamins-Minerals (VISION FORMULA PO) Take 1 capsule by mouth daily.    No facility-administered encounter medications on file as of 02/03/18.      Objective: Blood pressure 122/70, pulse 76, temperature 98.5 F (36.9 C), temperature source Oral, resp. rate 18, height 5\' 7"  (1.702 m), weight 184 lb 8 oz (83.7 kg). Patient is alert and in no acute distress. Conjunctiva is pink.  Sclera is nonicteric Oropharyngeal mucosa is normal. No neck masses or thyromegaly noted. Cardiac exam with regular rhythm normal S1 and S2. No murmur or gallop noted. Lungs are clear to auscultation. Abdomen is full.  On palpation is soft and nontender.  Spleen is easily palpable.  Liver is not palpable. No LE edema or clubbing noted.  Labs/studies Results:  Recent Labs    02/03/2018 0759  WBC 3.8*  HGB 12.8  HCT 39.8  PLT 126*    BMET  Recent Labs    2018-02-03 0759  NA 138  K 4.0  CL 97*  CO2 28  GLUCOSE 218*  BUN 17  CREATININE 0.79  CALCIUM 9.7    LFT  Recent Labs    02-03-18 0759  PROT 8.1  ALBUMIN 4.7  AST 33  ALT 28  ALKPHOS 105  BILITOT 0.9    PT/INR  Recent Labs    02-03-18 0759  LABPROT 14.2  INR 1.11     Assessment:  #1.  Chronic nausea and vomiting.  She does not have peptic ulcer disease.  She also does not have pancreatic abnormality.  Therefore her symptoms appear to be due to cholelithiasis.  She is scheduled for laparoscopic cholecystectomy later this week.  #2.  Fatty liver disease/hepatic fibrosis. Based on ultrasound/CT findings as well as EGD felt that she has cirrhosis.  However ultrasound-guided liver biopsy showed stage I-II disease.  This could be sampling error.  It is quite possible that she does not have cirrhosis and splenomegaly may be due to infectious mononucleosis.  Dr. Constance Haw has been requested to do another liver biopsy at the time of surgery.  Plan:  Laparoscopic cholecystectomy as planned by Dr. Constance Haw. She will have liver biopsy at the time of surgery. We will see patient back a few weeks after her surgery.

## 2018-01-30 ENCOUNTER — Encounter (HOSPITAL_COMMUNITY)
Admission: RE | Disposition: A | Discharge status: Home / Self Care | Payer: Self-pay | Source: Ambulatory Visit | Attending: General Surgery

## 2018-01-30 ENCOUNTER — Encounter (HOSPITAL_COMMUNITY): Payer: Self-pay | Admitting: *Deleted

## 2018-01-30 ENCOUNTER — Ambulatory Visit (HOSPITAL_COMMUNITY): Payer: Medicare Other | Admitting: Anesthesiology

## 2018-01-30 ENCOUNTER — Ambulatory Visit (HOSPITAL_BASED_OUTPATIENT_CLINIC_OR_DEPARTMENT_OTHER)
Admission: RE | Admit: 2018-01-30 | Discharge: 2018-01-30 | Disposition: A | Discharge status: Home / Self Care | Payer: Medicare Other | Source: Ambulatory Visit | Attending: General Surgery | Admitting: General Surgery

## 2018-01-30 DIAGNOSIS — J986 Disorders of diaphragm: Secondary | ICD-10-CM | POA: Diagnosis not present

## 2018-01-30 DIAGNOSIS — K8020 Calculus of gallbladder without cholecystitis without obstruction: Secondary | ICD-10-CM | POA: Diagnosis not present

## 2018-01-30 DIAGNOSIS — Z79890 Hormone replacement therapy (postmenopausal): Secondary | ICD-10-CM | POA: Insufficient documentation

## 2018-01-30 DIAGNOSIS — R091 Pleurisy: Secondary | ICD-10-CM | POA: Diagnosis not present

## 2018-01-30 DIAGNOSIS — I10 Essential (primary) hypertension: Secondary | ICD-10-CM | POA: Insufficient documentation

## 2018-01-30 DIAGNOSIS — R0602 Shortness of breath: Secondary | ICD-10-CM | POA: Diagnosis not present

## 2018-01-30 DIAGNOSIS — F419 Anxiety disorder, unspecified: Secondary | ICD-10-CM | POA: Insufficient documentation

## 2018-01-30 DIAGNOSIS — Z7984 Long term (current) use of oral hypoglycemic drugs: Secondary | ICD-10-CM | POA: Insufficient documentation

## 2018-01-30 DIAGNOSIS — E7800 Pure hypercholesterolemia, unspecified: Secondary | ICD-10-CM

## 2018-01-30 DIAGNOSIS — M546 Pain in thoracic spine: Secondary | ICD-10-CM | POA: Diagnosis not present

## 2018-01-30 DIAGNOSIS — K3184 Gastroparesis: Secondary | ICD-10-CM | POA: Diagnosis not present

## 2018-01-30 DIAGNOSIS — G4733 Obstructive sleep apnea (adult) (pediatric): Secondary | ICD-10-CM | POA: Diagnosis not present

## 2018-01-30 DIAGNOSIS — E039 Hypothyroidism, unspecified: Secondary | ICD-10-CM

## 2018-01-30 DIAGNOSIS — J181 Lobar pneumonia, unspecified organism: Secondary | ICD-10-CM | POA: Diagnosis not present

## 2018-01-30 DIAGNOSIS — M549 Dorsalgia, unspecified: Secondary | ICD-10-CM | POA: Diagnosis not present

## 2018-01-30 DIAGNOSIS — E785 Hyperlipidemia, unspecified: Secondary | ICD-10-CM | POA: Diagnosis not present

## 2018-01-30 DIAGNOSIS — M25519 Pain in unspecified shoulder: Secondary | ICD-10-CM | POA: Diagnosis not present

## 2018-01-30 DIAGNOSIS — Z79899 Other long term (current) drug therapy: Secondary | ICD-10-CM

## 2018-01-30 DIAGNOSIS — K8010 Calculus of gallbladder with chronic cholecystitis without obstruction: Secondary | ICD-10-CM

## 2018-01-30 DIAGNOSIS — M25511 Pain in right shoulder: Secondary | ICD-10-CM | POA: Diagnosis not present

## 2018-01-30 DIAGNOSIS — R0902 Hypoxemia: Secondary | ICD-10-CM | POA: Diagnosis not present

## 2018-01-30 DIAGNOSIS — F17200 Nicotine dependence, unspecified, uncomplicated: Secondary | ICD-10-CM | POA: Diagnosis not present

## 2018-01-30 DIAGNOSIS — Z9049 Acquired absence of other specified parts of digestive tract: Secondary | ICD-10-CM | POA: Diagnosis not present

## 2018-01-30 DIAGNOSIS — Z96652 Presence of left artificial knee joint: Secondary | ICD-10-CM

## 2018-01-30 DIAGNOSIS — K7460 Unspecified cirrhosis of liver: Secondary | ICD-10-CM | POA: Diagnosis not present

## 2018-01-30 DIAGNOSIS — K760 Fatty (change of) liver, not elsewhere classified: Secondary | ICD-10-CM | POA: Diagnosis not present

## 2018-01-30 DIAGNOSIS — G8918 Other acute postprocedural pain: Secondary | ICD-10-CM | POA: Diagnosis not present

## 2018-01-30 DIAGNOSIS — R509 Fever, unspecified: Secondary | ICD-10-CM | POA: Diagnosis not present

## 2018-01-30 DIAGNOSIS — E119 Type 2 diabetes mellitus without complications: Secondary | ICD-10-CM | POA: Insufficient documentation

## 2018-01-30 DIAGNOSIS — F329 Major depressive disorder, single episode, unspecified: Secondary | ICD-10-CM | POA: Diagnosis not present

## 2018-01-30 DIAGNOSIS — E1143 Type 2 diabetes mellitus with diabetic autonomic (poly)neuropathy: Secondary | ICD-10-CM | POA: Diagnosis not present

## 2018-01-30 DIAGNOSIS — M542 Cervicalgia: Secondary | ICD-10-CM | POA: Diagnosis not present

## 2018-01-30 DIAGNOSIS — R0600 Dyspnea, unspecified: Secondary | ICD-10-CM | POA: Diagnosis not present

## 2018-01-30 DIAGNOSIS — D6959 Other secondary thrombocytopenia: Secondary | ICD-10-CM | POA: Diagnosis not present

## 2018-01-30 DIAGNOSIS — K219 Gastro-esophageal reflux disease without esophagitis: Secondary | ICD-10-CM

## 2018-01-30 HISTORY — PX: CHOLECYSTECTOMY: SHX55

## 2018-01-30 HISTORY — PX: LIVER BIOPSY: SHX301

## 2018-01-30 LAB — GLUCOSE, CAPILLARY
GLUCOSE-CAPILLARY: 209 mg/dL — AB (ref 65–99)
GLUCOSE-CAPILLARY: 308 mg/dL — AB (ref 65–99)
Glucose-Capillary: 315 mg/dL — ABNORMAL HIGH (ref 65–99)
Glucose-Capillary: 317 mg/dL — ABNORMAL HIGH (ref 65–99)

## 2018-01-30 SURGERY — LAPAROSCOPIC CHOLECYSTECTOMY
Anesthesia: General | Site: Abdomen

## 2018-01-30 MED ORDER — LABETALOL HCL 5 MG/ML IV SOLN
INTRAVENOUS | Status: DC | PRN
  Administered 2018-01-30: 2.5 mg via INTRAVENOUS
  Administered 2018-01-30 (×2): 5 mg via INTRAVENOUS

## 2018-01-30 MED ORDER — GLYCOPYRROLATE 0.2 MG/ML IJ SOLN
INTRAMUSCULAR | Status: DC | PRN
  Administered 2018-01-30: 0.6 mg via INTRAVENOUS

## 2018-01-30 MED ORDER — LIDOCAINE HCL (PF) 1 % IJ SOLN
INTRAMUSCULAR | Status: DC | PRN
  Administered 2018-01-30: 50 mg

## 2018-01-30 MED ORDER — OXYCODONE-ACETAMINOPHEN 5-325 MG PO TABS
1.0000 | ORAL_TABLET | Freq: Once | ORAL | Status: AC
  Administered 2018-01-30: 1 via ORAL

## 2018-01-30 MED ORDER — HEMOSTATIC AGENTS (NO CHARGE) OPTIME
TOPICAL | Status: DC | PRN
  Administered 2018-01-30 (×3): 1 via TOPICAL

## 2018-01-30 MED ORDER — PROPOFOL 10 MG/ML IV BOLUS
INTRAVENOUS | Status: AC
  Filled 2018-01-30: qty 20

## 2018-01-30 MED ORDER — PHENYLEPHRINE 40 MCG/ML (10ML) SYRINGE FOR IV PUSH (FOR BLOOD PRESSURE SUPPORT)
PREFILLED_SYRINGE | INTRAVENOUS | Status: AC
  Filled 2018-01-30: qty 10

## 2018-01-30 MED ORDER — LIDOCAINE HCL (PF) 1 % IJ SOLN
INTRAMUSCULAR | Status: AC
  Filled 2018-01-30: qty 5

## 2018-01-30 MED ORDER — CHLORHEXIDINE GLUCONATE CLOTH 2 % EX PADS: 6.0000 | MEDICATED_PAD | Freq: Once | CUTANEOUS | Status: DC

## 2018-01-30 MED ORDER — ONDANSETRON HCL 4 MG/2ML IJ SOLN
4.0000 mg | Freq: Once | INTRAMUSCULAR | Status: AC
  Administered 2018-01-30: 4 mg via INTRAVENOUS

## 2018-01-30 MED ORDER — LABETALOL HCL 5 MG/ML IV SOLN
INTRAVENOUS | Status: AC
  Filled 2018-01-30: qty 4

## 2018-01-30 MED ORDER — CLINDAMYCIN PHOSPHATE 900 MG/50ML IV SOLN
900.0000 mg | INTRAVENOUS | Status: AC
  Administered 2018-01-30: 900 mg via INTRAVENOUS

## 2018-01-30 MED ORDER — INSULIN ASPART 100 UNIT/ML ~~LOC~~ SOLN
4.0000 [IU] | Freq: Once | SUBCUTANEOUS | Status: AC
  Administered 2018-01-30: 4 [IU] via SUBCUTANEOUS

## 2018-01-30 MED ORDER — SUCCINYLCHOLINE CHLORIDE 20 MG/ML IJ SOLN
INTRAMUSCULAR | Status: DC | PRN
  Administered 2018-01-30: 120 mg via INTRAVENOUS

## 2018-01-30 MED ORDER — OXYCODONE-ACETAMINOPHEN 5-325 MG PO TABS
ORAL_TABLET | ORAL | Status: AC
  Filled 2018-01-30: qty 1

## 2018-01-30 MED ORDER — LACTATED RINGERS IV SOLN
INTRAVENOUS | Status: DC
  Administered 2018-01-30: 1000 mL via INTRAVENOUS
  Administered 2018-01-30: 09:00:00 via INTRAVENOUS

## 2018-01-30 MED ORDER — HYDROMORPHONE HCL 1 MG/ML IJ SOLN
INTRAMUSCULAR | Status: AC
  Filled 2018-01-30: qty 0.5

## 2018-01-30 MED ORDER — BUPIVACAINE HCL (PF) 0.5 % IJ SOLN
INTRAMUSCULAR | Status: DC | PRN
  Administered 2018-01-30: 20 mL

## 2018-01-30 MED ORDER — GLYCOPYRROLATE 0.2 MG/ML IJ SOLN
INTRAMUSCULAR | Status: AC
  Filled 2018-01-30: qty 3

## 2018-01-30 MED ORDER — MIDAZOLAM HCL 2 MG/2ML IJ SOLN
1.0000 mg | INTRAMUSCULAR | Status: AC
  Administered 2018-01-30: 2 mg via INTRAVENOUS

## 2018-01-30 MED ORDER — ONDANSETRON HCL 4 MG/2ML IJ SOLN
INTRAMUSCULAR | Status: AC
  Filled 2018-01-30: qty 2

## 2018-01-30 MED ORDER — CLINDAMYCIN PHOSPHATE 900 MG/50ML IV SOLN
INTRAVENOUS | Status: AC
  Filled 2018-01-30: qty 50

## 2018-01-30 MED ORDER — NEOSTIGMINE METHYLSULFATE 10 MG/10ML IV SOLN
INTRAVENOUS | Status: AC
  Filled 2018-01-30: qty 1

## 2018-01-30 MED ORDER — ROCURONIUM BROMIDE 100 MG/10ML IV SOLN
INTRAVENOUS | Status: DC | PRN
  Administered 2018-01-30: 5 mg via INTRAVENOUS
  Administered 2018-01-30: 30 mg via INTRAVENOUS

## 2018-01-30 MED ORDER — DEXAMETHASONE SODIUM PHOSPHATE 4 MG/ML IJ SOLN
4.0000 mg | Freq: Once | INTRAMUSCULAR | Status: AC
  Administered 2018-01-30: 4 mg via INTRAVENOUS

## 2018-01-30 MED ORDER — SUCCINYLCHOLINE CHLORIDE 20 MG/ML IJ SOLN
INTRAMUSCULAR | Status: AC
  Filled 2018-01-30: qty 1

## 2018-01-30 MED ORDER — SODIUM CHLORIDE 0.9 % IR SOLN
Status: DC | PRN
  Administered 2018-01-30: 1000 mL

## 2018-01-30 MED ORDER — DOCUSATE SODIUM 100 MG PO CAPS: 100.0000 mg | ORAL_CAPSULE | Freq: Two times a day (BID) | ORAL | 2 refills | Status: DC

## 2018-01-30 MED ORDER — HYDROMORPHONE HCL 1 MG/ML IJ SOLN
0.2500 mg | INTRAMUSCULAR | Status: DC | PRN
  Administered 2018-01-30: 0.25 mg via INTRAVENOUS

## 2018-01-30 MED ORDER — ROCURONIUM BROMIDE 50 MG/5ML IV SOLN
INTRAVENOUS | Status: AC
  Filled 2018-01-30: qty 1

## 2018-01-30 MED ORDER — NEOSTIGMINE METHYLSULFATE 10 MG/10ML IV SOLN
INTRAVENOUS | Status: DC | PRN
  Administered 2018-01-30: 4 mg via INTRAVENOUS

## 2018-01-30 MED ORDER — PROPOFOL 10 MG/ML IV BOLUS
INTRAVENOUS | Status: DC | PRN
  Administered 2018-01-30: 50 mg via INTRAVENOUS
  Administered 2018-01-30: 120 mg via INTRAVENOUS
  Administered 2018-01-30: 30 mg via INTRAVENOUS

## 2018-01-30 MED ORDER — BUPIVACAINE HCL (PF) 0.5 % IJ SOLN
INTRAMUSCULAR | Status: AC
  Filled 2018-01-30: qty 30

## 2018-01-30 MED ORDER — PHENYLEPHRINE HCL 10 MG/ML IJ SOLN
INTRAMUSCULAR | Status: DC | PRN
  Administered 2018-01-30: 200 ug via INTRAVENOUS

## 2018-01-30 MED ORDER — FENTANYL CITRATE (PF) 250 MCG/5ML IJ SOLN
INTRAMUSCULAR | Status: AC
  Filled 2018-01-30: qty 5

## 2018-01-30 MED ORDER — DEXAMETHASONE SODIUM PHOSPHATE 4 MG/ML IJ SOLN
INTRAMUSCULAR | Status: AC
  Filled 2018-01-30: qty 1

## 2018-01-30 MED ORDER — OXYCODONE HCL 5 MG PO TABS: 5.0000 mg | ORAL_TABLET | ORAL | 0 refills | Status: DC | PRN

## 2018-01-30 MED ORDER — FENTANYL CITRATE (PF) 100 MCG/2ML IJ SOLN
25.0000 ug | INTRAMUSCULAR | Status: AC | PRN
  Administered 2018-01-30 (×6): 25 ug via INTRAVENOUS
  Filled 2018-01-30 (×2): qty 2

## 2018-01-30 MED ORDER — MIDAZOLAM HCL 2 MG/2ML IJ SOLN
INTRAMUSCULAR | Status: AC
  Filled 2018-01-30: qty 2

## 2018-01-30 MED ORDER — FENTANYL CITRATE (PF) 250 MCG/5ML IJ SOLN
INTRAMUSCULAR | Status: DC | PRN
  Administered 2018-01-30: 25 ug via INTRAVENOUS
  Administered 2018-01-30: 75 ug via INTRAVENOUS
  Administered 2018-01-30 (×3): 50 ug via INTRAVENOUS

## 2018-01-30 SURGICAL SUPPLY — 50 items
APPLICATOR ARISTA FLEXITIP XL (MISCELLANEOUS) ×3 IMPLANT
APPLIER CLIP ROT 10 11.4 M/L (STAPLE) ×3
BAG HAMPER (MISCELLANEOUS) ×3 IMPLANT
BAG RETRIEVAL 10 (BASKET) ×1
BAG RETRIEVAL 10MM (BASKET) ×1
BLADE SURG 15 STRL LF DISP TIS (BLADE) ×1 IMPLANT
BLADE SURG 15 STRL SS (BLADE) ×2
CHLORAPREP W/TINT 26ML (MISCELLANEOUS) ×3 IMPLANT
CLIP APPLIE ROT 10 11.4 M/L (STAPLE) ×1 IMPLANT
CLOTH BEACON ORANGE TIMEOUT ST (SAFETY) ×3 IMPLANT
COVER LIGHT HANDLE STERIS (MISCELLANEOUS) ×6 IMPLANT
DECANTER SPIKE VIAL GLASS SM (MISCELLANEOUS) ×3 IMPLANT
DERMABOND ADVANCED (GAUZE/BANDAGES/DRESSINGS) ×4
DERMABOND ADVANCED .7 DNX12 (GAUZE/BANDAGES/DRESSINGS) ×2 IMPLANT
ELECT REM PT RETURN 9FT ADLT (ELECTROSURGICAL) ×3
ELECTRODE REM PT RTRN 9FT ADLT (ELECTROSURGICAL) ×1 IMPLANT
FILTER SMOKE EVAC LAPAROSHD (FILTER) ×3 IMPLANT
GLOVE BIO SURGEON STRL SZ 6.5 (GLOVE) ×2 IMPLANT
GLOVE BIO SURGEON STRL SZ7 (GLOVE) ×6 IMPLANT
GLOVE BIO SURGEONS STRL SZ 6.5 (GLOVE) ×1
GLOVE BIOGEL PI IND STRL 6.5 (GLOVE) ×2 IMPLANT
GLOVE BIOGEL PI IND STRL 7.0 (GLOVE) ×2 IMPLANT
GLOVE BIOGEL PI INDICATOR 6.5 (GLOVE) ×4
GLOVE BIOGEL PI INDICATOR 7.0 (GLOVE) ×4
GOWN STRL REUS W/TWL LRG LVL3 (GOWN DISPOSABLE) ×9 IMPLANT
HEMOSTAT ARISTA ABSORB 3G PWDR (MISCELLANEOUS) ×3 IMPLANT
HEMOSTAT SNOW SURGICEL 2X4 (HEMOSTASIS) ×6 IMPLANT
INST SET LAPROSCOPIC AP (KITS) ×3 IMPLANT
IV NS IRRIG 3000ML ARTHROMATIC (IV SOLUTION) IMPLANT
KIT TURNOVER KIT A (KITS) ×3 IMPLANT
MANIFOLD NEPTUNE II (INSTRUMENTS) ×3 IMPLANT
NEEDLE BIOPSY 14X6 SOFT TISS (NEEDLE) ×3 IMPLANT
NEEDLE INSUFFLATION 14GA 120MM (NEEDLE) ×3 IMPLANT
NS IRRIG 1000ML POUR BTL (IV SOLUTION) ×3 IMPLANT
PACK LAP CHOLE LZT030E (CUSTOM PROCEDURE TRAY) ×3 IMPLANT
PAD ARMBOARD 7.5X6 YLW CONV (MISCELLANEOUS) ×3 IMPLANT
SET BASIN LINEN APH (SET/KITS/TRAYS/PACK) ×3 IMPLANT
SET TUBE IRRIG SUCTION NO TIP (IRRIGATION / IRRIGATOR) IMPLANT
SLEEVE ENDOPATH XCEL 5M (ENDOMECHANICALS) ×3 IMPLANT
SUT MNCRL AB 4-0 PS2 18 (SUTURE) ×3 IMPLANT
SUT VICRYL 0 UR6 27IN ABS (SUTURE) ×3 IMPLANT
SYS BAG RETRIEVAL 10MM (BASKET) ×1
SYSTEM BAG RETRIEVAL 10MM (BASKET) ×1 IMPLANT
TROCAR ENDO BLADELESS 11MM (ENDOMECHANICALS) ×3 IMPLANT
TROCAR XCEL NON-BLD 5MMX100MML (ENDOMECHANICALS) ×3 IMPLANT
TROCAR XCEL UNIV SLVE 11M 100M (ENDOMECHANICALS) ×3 IMPLANT
TUBE CONNECTING 12'X1/4 (SUCTIONS) ×1
TUBE CONNECTING 12X1/4 (SUCTIONS) ×2 IMPLANT
TUBING INSUFFLATION (TUBING) ×3 IMPLANT
WARMER LAPAROSCOPE (MISCELLANEOUS) ×3 IMPLANT

## 2018-01-30 NOTE — Interval H&P Note (Signed)
History and Physical Interval Note:  01/30/2018 7:02 AM  Katherine Bell  has presented today for surgery, with the diagnosis of cholelithiasis, fatty liver  The various methods of treatment have been discussed with the patient and family. After consideration of risks, benefits and other options for treatment, the patient has consented to  Procedure(s): LAPAROSCOPIC CHOLECYSTECTOMY (N/A) LIVER BIOPSY (N/A) as a surgical intervention .  The patient's history has been reviewed, patient examined, no change in status, stable for surgery.  I have reviewed the patient's chart and labs.  Questions were answered to the patient's satisfaction.    No questions. Plans for lap chole and liver biopsy. Platelets were 126. Blood available given relative thrombocytopenia and splenomegaly (which has been improving).  Platelets on hold with supplier pending need. Discussed with blood bank.   Virl Cagey

## 2018-01-30 NOTE — Transfer of Care (Signed)
Immediate Anesthesia Transfer of Care Note  Patient: Katherine Bell  Procedure(s) Performed: LAPAROSCOPIC CHOLECYSTECTOMY (N/A Abdomen) LIVER BIOPSY (N/A Abdomen)  Patient Location: PACU  Anesthesia Type:General  Level of Consciousness: awake, drowsy and patient cooperative  Airway & Oxygen Therapy: Patient Spontanous Breathing and Patient connected to face mask oxygen  Post-op Assessment: Report given to RN and Post -op Vital signs reviewed and stable  Post vital signs: Reviewed and stable  Last Vitals:  Vitals Value Taken Time  BP 126/78 01/30/2018  9:00 AM  Temp    Pulse 87 01/30/2018  9:02 AM  Resp 21 01/30/2018  9:02 AM  SpO2 94 % 01/30/2018  9:02 AM  Vitals shown include unvalidated device data.  Last Pain:  Vitals:   01/30/18 0639  TempSrc: Oral  PainSc: 0-No pain      Patients Stated Pain Goal: 8 (31/54/00 8676)  Complications: No apparent anesthesia complications

## 2018-01-30 NOTE — Anesthesia Procedure Notes (Signed)
Procedure Name: Intubation Date/Time: 01/30/2018 7:37 AM Performed by: Raenette Rover, CRNA Pre-anesthesia Checklist: Patient identified, Emergency Drugs available, Suction available and Patient being monitored Patient Re-evaluated:Patient Re-evaluated prior to induction Oxygen Delivery Method: Circle system utilized Preoxygenation: Pre-oxygenation with 100% oxygen Induction Type: IV induction, Rapid sequence and Cricoid Pressure applied Laryngoscope Size: Mac and 3 Grade View: Grade I Tube type: Oral Tube size: 7.0 mm Number of attempts: 1 Airway Equipment and Method: Stylet Placement Confirmation: ETT inserted through vocal cords under direct vision,  positive ETCO2,  CO2 detector and breath sounds checked- equal and bilateral Secured at: 21 cm Tube secured with: Tape Dental Injury: Teeth and Oropharynx as per pre-operative assessment

## 2018-01-30 NOTE — Anesthesia Postprocedure Evaluation (Signed)
Anesthesia Post Note  Patient: Katherine Bell  Procedure(s) Performed: LAPAROSCOPIC CHOLECYSTECTOMY (N/A Abdomen) LIVER BIOPSY (N/A Abdomen)  Patient location during evaluation: PACU Anesthesia Type: General Level of consciousness: awake and alert, oriented and patient cooperative Pain management: pain level controlled Vital Signs Assessment: post-procedure vital signs reviewed and stable Respiratory status: spontaneous breathing and respiratory function stable Cardiovascular status: stable Postop Assessment: no apparent nausea or vomiting Anesthetic complications: no     Last Vitals:  Vitals:   01/30/18 0946 01/30/18 0950  BP:    Pulse: 88   Resp: 14   Temp:    SpO2: 91% 90%    Last Pain:  Vitals:   01/30/18 0950  TempSrc:   PainSc: 10-Worst pain ever                 [name] A

## 2018-01-30 NOTE — Anesthesia Preprocedure Evaluation (Signed)
Anesthesia Evaluation  Patient identified by MRN, date of birth, ID band Patient awake    Reviewed: Allergy & Precautions, NPO status , Patient's Chart, lab work & pertinent test results  Airway Mallampati: II  TM Distance: >3 FB Neck ROM: Full    Dental  (+) Teeth Intact, Dental Advisory Given, Missing   Pulmonary sleep apnea ,    breath sounds clear to auscultation       Cardiovascular hypertension, Pt. on medications  Rhythm:Regular Rate:Normal     Neuro/Psych PSYCHIATRIC DISORDERS Anxiety    GI/Hepatic GERD  Medicated and Poorly Controlled,(+) Cirrhosis  ( NASH)      ,   Endo/Other  diabetes, Type 2Hypothyroidism   Renal/GU      Musculoskeletal  (+) Arthritis , Osteoarthritis,    Abdominal   Peds  Hematology  (+) anemia ,   Anesthesia Other Findings   Reproductive/Obstetrics                             Anesthesia Physical Anesthesia Plan  ASA: III  Anesthesia Plan: General   Post-op Pain Management:    Induction: Intravenous, Rapid sequence and Cricoid pressure planned  PONV Risk Score and Plan:   Airway Management Planned: Oral ETT  Additional Equipment:   Intra-op Plan:   Post-operative Plan: Extubation in OR  Informed Consent: I have reviewed the patients History and Physical, chart, labs and discussed the procedure including the risks, benefits and alternatives for the proposed anesthesia with the patient or authorized representative who has indicated his/her understanding and acceptance.     Plan Discussed with:   Anesthesia Plan Comments:         Anesthesia Quick Evaluation

## 2018-01-30 NOTE — Op Note (Addendum)
Operative Note   Preoperative Diagnosis: Symptomatic cholelithiasis and fatty liver    Postoperative Diagnosis: Same   Procedure(s) Performed: Laparoscopic cholecystectomy; wedge liver biopsy    Surgeon: Lanell Matar. Constance Haw, MD   Assistants: No qualified resident was available   Anesthesia: General endotracheal   Anesthesiologist: Lerry Liner, MD    Specimens: Gallbladder and liver biopsy    Estimated Blood Loss: 50cc   Blood Replacement: None    Complications: None    Operative Findings:  Fatty liver; large stomach and spleen; normal gallbladder without signs of inflammation or infection    Indications: Ms. Pendergrass is a 67 yo with chronic nausea and vomiting with a history of splenomegaly thought to possibly be from mononucleosis. She has had some resolving pancytopenia, but continued to have some mild thrombocytopenia on her preoperative labs.  After a thorough discussion of the risk and benefits of laparoscopic cholecystectomy including bleeding, infection, injruy to the biliary system, and need for an open surgery, and in her case risk of bleeding or injury to a large spleen, she opted to proceed. We crossed her for blood as a precaution prior to surgery.   Procedure: The patient was taken to the operating room and placed supine. General endotracheal anesthesia was induced. Intravenous antibiotics were administered per protocol. An orogastric tube positioned to decompress the stomach. The abdomen was prepared and draped in the usual sterile fashion.    A supraumbilical incision was made and a Veress technique was utilized to achieve pneumoperitoneum to 15 mmHg with carbon dioxide. A 12 mm optiview port was placed through the supraumbilical region, and a 10 mm 0-degree operative laparoscope was introduced. The area underlying the trocar and Veress needle were inspected and without evidence of injury.  Remaining trocars were placed under direct vision. Two 5 mm ports were placed in the  right abdomen, between the anterior axillary and midclavicular line.  A final 10 mm port was placed through the mid-epigastrium, near the falciform ligament.  The abdomen was investigated and the spleen was noted to be large and the stomach was large. The OG was advanced to decompress the stomach more.     The gallbladder fundus was elevated cephalad and the infundibulum was retracted to the patient's right. The gallbladder/cystic duct junction was skeletonized. The cystic artery noted in the triangle of Calot and was also skeletonized.  We then continued liberal medial and lateral dissection until the critical view of safety was achieved.    The cystic duct and cystic artery were doubly clipped and divided. The gallbladder was then dissected from the liver bed with electrocautery.  Attempts at using a Trucut for the liver biopsy were made, but we kept not getting sufficient specimen. At that time, I opted to cauterize out a wedge of liver from the left lateral edge for the biopsy.  The biopsy site was cauterized for hemostasis. Both specimen were placed in an Endopouch and was retrieved through the epigastric site and sent to pathology separately.    Final inspection revealed acceptable hemostasis. Arista and surgical Emogene Morgan was placed in the hepatic bed and at the biopsy site. Trocars were removed under direct vision and pneumoperitoneum was released. There was no bleeding from the port sites.  A 0 Vicryl fascial suture was used to close the epigastric and umbilical port sites. Skin incisions were closed with 4-0 Monocryl subcuticular sutures and Dermabond. The patient was awakened from anesthesia and extubated without complication.   Photos of the spleen, stomach, liver, and gallbladder  were given to the husband.    Curlene Labrum, MD [facility] [address] [place], [place] [address] [phone] (office)

## 2018-01-30 NOTE — Discharge Instructions (Signed)
Discharge Instructions: Shower per your regular routine. Take tylenol and ibuprofen as needed for pain control, alternating every 4-6 hours.  Take Roxicodone for breakthrough pain. Take colace for constipation related to narcotic pain medication. Do not pick at the dermabond glue on your incision sites.   Liver Biopsy, Care After These instructions give you information on caring for yourself after your procedure. Your doctor may also give you more specific instructions. Call your doctor if you have any problems or questions after your procedure. Follow these instructions at home:  Rest at home for 1-2 days or as told by your doctor.  Have someone stay with you for at least 24 hours.  Do not do these things in the first 24 hours: ? Drive. ? Use machinery. ? Take care of other people. ? Sign legal documents. ? Take a bath or shower.  There are many different ways to close and cover a cut (incision). For example, a cut can be closed with stitches, skin glue, or adhesive strips. Follow your doctor's instructions on: ? Taking care of your cut. ? Changing and removing your bandage (dressing). ? Removing whatever was used to close your cut.  Do not drink alcohol in the first week.  Do not lift more than 5 pounds or play contact sports for the first 2 weeks.  Take medicines only as told by your doctor. For 1 week, do not take medicine that has aspirin in it or medicines like ibuprofen.  Get your test results. Contact a doctor if:  A cut bleeds and leaves more than just a small spot of blood.  A cut is red, puffs up (swells), or hurts more than before.  Fluid or something else comes from a cut.  A cut smells bad.  You have a fever or chills. Get help right away if:  You have swelling, bloating, or pain in your belly (abdomen).  You get dizzy or faint.  You have a rash.  You feel sick to your stomach (nauseous) or throw up (vomit).  You have trouble breathing, feel short  of breath, or feel faint.  Your chest hurts.  You have problems talking or seeing.  You have trouble balancing or moving your arms or legs. This information is not intended to replace advice given to you by your health care provider. Make sure you discuss any questions you have with your health care provider. Document Released: 07/30/2008 Document Revised: 03/28/2016 Document Reviewed: 12/17/2013 Elsevier Interactive Patient Education  2018 [employer]. Laparoscopic Cholecystectomy, Care After This sheet gives you information about how to care for yourself after your procedure. Your doctor may also give you more specific instructions. If you have problems or questions, contact your doctor. Follow these instructions at home: Care for cuts from surgery (incisions)   Follow instructions from your doctor about how to take care of your cuts from surgery. Make sure you: ? Wash your hands with soap and water before you change your bandage (dressing). If you cannot use soap and water, use hand sanitizer. ? Change your bandage as told by your doctor. ? Leave stitches (sutures), skin glue, or skin tape (adhesive) strips in place. They may need to stay in place for 2 weeks or longer. If tape strips get loose and curl up, you may trim the loose edges. Do not remove tape strips completely unless your doctor says it is okay.  Do not take baths, swim, or use a hot tub until your doctor says it is okay. Ask  your doctor if you can take showers. You may only be allowed to take sponge baths for bathing.  Check your surgical cut area every day for signs of infection. Check for: ? More redness, swelling, or pain. ? More fluid or blood. ? Warmth. ? Pus or a bad smell. Activity  Do not drive or use heavy machinery while taking prescription pain medicine.  Do not lift anything that is heavier than 10 lb (4.5 kg) until your doctor says it is okay.  Do not play contact sports until your doctor says it is  okay.  Do not drive for 24 hours if you were given a medicine to help you relax (sedative).  Rest as needed. Do not return to work or school until your doctor says it is okay. General instructions  Take over-the-counter and prescription medicines only as told by your doctor.  To prevent or treat constipation while you are taking prescription pain medicine, your doctor may recommend that you: ? Drink enough fluid to keep your pee (urine) clear or pale yellow. ? Take over-the-counter or prescription medicines. ? Eat foods that are high in fiber, such as fresh fruits and vegetables, whole grains, and beans. ? Limit foods that are high in fat and processed sugars, such as fried and sweet foods. Contact a doctor if:  You develop a rash.  You have more redness, swelling, or pain around your surgical cuts.  You have more fluid or blood coming from your surgical cuts.  Your surgical cuts feel warm to the touch.  You have pus or a bad smell coming from your surgical cuts.  You have a fever.  One or more of your surgical cuts breaks open. Get help right away if:  You have trouble breathing.  You have chest pain.  You have pain that is getting worse in your shoulders.  You faint or feel dizzy when you stand.  You have very bad pain in your belly (abdomen).  You are sick to your stomach (nauseous) for more than one day.  You have throwing up (vomiting) that lasts for more than one day.  You have leg pain. This information is not intended to replace advice given to you by your health care provider. Make sure you discuss any questions you have with your health care provider. Document Released: 07/30/2008 Document Revised: 05/11/2016 Document Reviewed: 04/08/2016 Elsevier Interactive Patient Education  2018 [employer].

## 2018-01-31 ENCOUNTER — Emergency Department (HOSPITAL_COMMUNITY): Payer: Medicare Other

## 2018-01-31 ENCOUNTER — Other Ambulatory Visit: Payer: Self-pay

## 2018-01-31 ENCOUNTER — Observation Stay (HOSPITAL_COMMUNITY): Payer: Medicare Other

## 2018-01-31 ENCOUNTER — Encounter (HOSPITAL_COMMUNITY): Payer: Self-pay

## 2018-01-31 ENCOUNTER — Inpatient Hospital Stay (HOSPITAL_COMMUNITY)
Admission: EM | Admit: 2018-01-31 | Discharge: 2018-02-02 | DRG: 939 | Disposition: A | Discharge status: Home / Self Care | Payer: Medicare Other | Attending: General Surgery | Admitting: General Surgery

## 2018-01-31 DIAGNOSIS — R0600 Dyspnea, unspecified: Secondary | ICD-10-CM | POA: Diagnosis not present

## 2018-01-31 DIAGNOSIS — Z8249 Family history of ischemic heart disease and other diseases of the circulatory system: Secondary | ICD-10-CM

## 2018-01-31 DIAGNOSIS — K8010 Calculus of gallbladder with chronic cholecystitis without obstruction: Secondary | ICD-10-CM | POA: Diagnosis present

## 2018-01-31 DIAGNOSIS — K7460 Unspecified cirrhosis of liver: Secondary | ICD-10-CM | POA: Diagnosis present

## 2018-01-31 DIAGNOSIS — I10 Essential (primary) hypertension: Secondary | ICD-10-CM | POA: Diagnosis present

## 2018-01-31 DIAGNOSIS — M25511 Pain in right shoulder: Secondary | ICD-10-CM | POA: Diagnosis present

## 2018-01-31 DIAGNOSIS — Z7984 Long term (current) use of oral hypoglycemic drugs: Secondary | ICD-10-CM

## 2018-01-31 DIAGNOSIS — K219 Gastro-esophageal reflux disease without esophagitis: Secondary | ICD-10-CM | POA: Diagnosis present

## 2018-01-31 DIAGNOSIS — D6959 Other secondary thrombocytopenia: Secondary | ICD-10-CM | POA: Diagnosis present

## 2018-01-31 DIAGNOSIS — F419 Anxiety disorder, unspecified: Secondary | ICD-10-CM | POA: Diagnosis present

## 2018-01-31 DIAGNOSIS — R0902 Hypoxemia: Secondary | ICD-10-CM | POA: Diagnosis not present

## 2018-01-31 DIAGNOSIS — F329 Major depressive disorder, single episode, unspecified: Secondary | ICD-10-CM | POA: Diagnosis present

## 2018-01-31 DIAGNOSIS — G8918 Other acute postprocedural pain: Secondary | ICD-10-CM | POA: Diagnosis present

## 2018-01-31 DIAGNOSIS — Z882 Allergy status to sulfonamides status: Secondary | ICD-10-CM

## 2018-01-31 DIAGNOSIS — Z96652 Presence of left artificial knee joint: Secondary | ICD-10-CM | POA: Diagnosis present

## 2018-01-31 DIAGNOSIS — E1143 Type 2 diabetes mellitus with diabetic autonomic (poly)neuropathy: Secondary | ICD-10-CM | POA: Diagnosis present

## 2018-01-31 DIAGNOSIS — M546 Pain in thoracic spine: Secondary | ICD-10-CM | POA: Diagnosis not present

## 2018-01-31 DIAGNOSIS — Z9049 Acquired absence of other specified parts of digestive tract: Secondary | ICD-10-CM | POA: Diagnosis not present

## 2018-01-31 DIAGNOSIS — Z880 Allergy status to penicillin: Secondary | ICD-10-CM

## 2018-01-31 DIAGNOSIS — K3184 Gastroparesis: Secondary | ICD-10-CM | POA: Diagnosis present

## 2018-01-31 DIAGNOSIS — R091 Pleurisy: Secondary | ICD-10-CM | POA: Diagnosis not present

## 2018-01-31 DIAGNOSIS — Z01818 Encounter for other preprocedural examination: Secondary | ICD-10-CM

## 2018-01-31 DIAGNOSIS — M1990 Unspecified osteoarthritis, unspecified site: Secondary | ICD-10-CM | POA: Diagnosis present

## 2018-01-31 DIAGNOSIS — Z833 Family history of diabetes mellitus: Secondary | ICD-10-CM

## 2018-01-31 DIAGNOSIS — E785 Hyperlipidemia, unspecified: Secondary | ICD-10-CM | POA: Diagnosis present

## 2018-01-31 DIAGNOSIS — G4733 Obstructive sleep apnea (adult) (pediatric): Secondary | ICD-10-CM | POA: Diagnosis present

## 2018-01-31 DIAGNOSIS — R509 Fever, unspecified: Secondary | ICD-10-CM | POA: Diagnosis not present

## 2018-01-31 DIAGNOSIS — E039 Hypothyroidism, unspecified: Secondary | ICD-10-CM | POA: Diagnosis present

## 2018-01-31 DIAGNOSIS — J181 Lobar pneumonia, unspecified organism: Secondary | ICD-10-CM | POA: Diagnosis present

## 2018-01-31 DIAGNOSIS — J9811 Atelectasis: Secondary | ICD-10-CM | POA: Diagnosis not present

## 2018-01-31 DIAGNOSIS — R0602 Shortness of breath: Secondary | ICD-10-CM | POA: Diagnosis not present

## 2018-01-31 LAB — CBC WITH DIFFERENTIAL/PLATELET
BASOS PCT: 0 %
Basophils Absolute: 0 10*3/uL (ref 0.0–0.1)
Basophils Absolute: 0 10*3/uL (ref 0.0–0.1)
Basophils Relative: 0 %
EOS ABS: 0 10*3/uL (ref 0.0–0.7)
EOS PCT: 0 %
Eosinophils Absolute: 0 10*3/uL (ref 0.0–0.7)
Eosinophils Relative: 0 %
HCT: 37.6 % (ref 36.0–46.0)
HEMATOCRIT: 32.9 % — AB (ref 36.0–46.0)
HEMOGLOBIN: 10.4 g/dL — AB (ref 12.0–15.0)
Hemoglobin: 12 g/dL (ref 12.0–15.0)
LYMPHS ABS: 0.5 10*3/uL — AB (ref 0.7–4.0)
Lymphocytes Relative: 6 %
Lymphocytes Relative: 5 %
Lymphs Abs: 0.3 10*3/uL — ABNORMAL LOW (ref 0.7–4.0)
MCH: 24 pg — AB (ref 26.0–34.0)
MCH: 24.1 pg — ABNORMAL LOW (ref 26.0–34.0)
MCHC: 31.9 g/dL (ref 30.0–36.0)
MCHC: 31.6 g/dL (ref 30.0–36.0)
MCV: 75.2 fL — AB (ref 78.0–100.0)
MCV: 76.2 fL — ABNORMAL LOW (ref 78.0–100.0)
MONO ABS: 0.5 10*3/uL (ref 0.1–1.0)
MONOS PCT: 5 %
Monocytes Absolute: 0.3 10*3/uL (ref 0.1–1.0)
Monocytes Relative: 6 %
NEUTROS ABS: 5.1 10*3/uL (ref 1.7–7.7)
NEUTROS PCT: 90 %
Neutro Abs: 7 10*3/uL (ref 1.7–7.7)
Neutrophils Relative %: 88 %
PLATELETS: 141 10*3/uL — AB (ref 150–400)
Platelets: 102 10*3/uL — ABNORMAL LOW (ref 150–400)
RBC: 5 MIL/uL (ref 3.87–5.11)
RBC: 4.32 MIL/uL (ref 3.87–5.11)
RDW: 15.7 % — AB (ref 11.5–15.5)
RDW: 15.8 % — ABNORMAL HIGH (ref 11.5–15.5)
WBC: 7.9 10*3/uL (ref 4.0–10.5)
WBC: 5.7 10*3/uL (ref 4.0–10.5)

## 2018-01-31 LAB — GLUCOSE, CAPILLARY
GLUCOSE-CAPILLARY: 195 mg/dL — AB (ref 65–99)
Glucose-Capillary: 264 mg/dL — ABNORMAL HIGH (ref 65–99)
Glucose-Capillary: 233 mg/dL — ABNORMAL HIGH (ref 65–99)

## 2018-01-31 LAB — COMPREHENSIVE METABOLIC PANEL
ALBUMIN: 4.5 g/dL (ref 3.5–5.0)
ALT: 61 U/L — AB (ref 14–54)
AST: 63 U/L — ABNORMAL HIGH (ref 15–41)
Alkaline Phosphatase: 93 U/L (ref 38–126)
Anion gap: 16 — ABNORMAL HIGH (ref 5–15)
BILIRUBIN TOTAL: 1 mg/dL (ref 0.3–1.2)
BUN: 25 mg/dL — AB (ref 6–20)
CHLORIDE: 96 mmol/L — AB (ref 101–111)
CO2: 25 mmol/L (ref 22–32)
CREATININE: 0.96 mg/dL (ref 0.44–1.00)
Calcium: 8.9 mg/dL (ref 8.9–10.3)
GFR calc Af Amer: >60 mL/min (ref >60)
GFR calc non Af Amer: 59 mL/min — ABNORMAL LOW (ref >60)
GLUCOSE: 297 mg/dL — AB (ref 65–99)
Potassium: 3.8 mmol/L (ref 3.5–5.1)
Sodium: 137 mmol/L (ref 135–145)
Total Protein: 7.6 g/dL (ref 6.5–8.1)

## 2018-01-31 LAB — PROTIME-INR
INR: 1.17
Prothrombin Time: 14.8 seconds (ref 11.4–15.2)

## 2018-01-31 LAB — APTT: aPTT: 31 seconds (ref 24–36)

## 2018-01-31 LAB — TROPONIN I

## 2018-01-31 LAB — LIPASE, BLOOD: Lipase: 34 U/L (ref 11–51)

## 2018-01-31 MED ORDER — ACYCLOVIR 800 MG PO TABS
400.0000 mg | ORAL_TABLET | Freq: Two times a day (BID) | ORAL | Status: DC
  Administered 2018-01-31 – 2018-02-02 (×5): 400 mg via ORAL
  Filled 2018-01-31 (×5): qty 1

## 2018-01-31 MED ORDER — ATORVASTATIN CALCIUM 20 MG PO TABS
20.0000 mg | ORAL_TABLET | Freq: Every day | ORAL | Status: DC
  Administered 2018-01-31 – 2018-02-02 (×3): 20 mg via ORAL
  Filled 2018-01-31 (×3): qty 1

## 2018-01-31 MED ORDER — IOPAMIDOL (ISOVUE-370) INJECTION 76%
100.0000 mL | Freq: Once | INTRAVENOUS | Status: AC | PRN
  Administered 2018-01-31: 100 mL via INTRAVENOUS

## 2018-01-31 MED ORDER — MORPHINE SULFATE (PF) 2 MG/ML IV SOLN
2.0000 mg | INTRAVENOUS | Status: DC | PRN
  Administered 2018-01-31 – 2018-02-01 (×6): 2 mg via INTRAVENOUS
  Filled 2018-01-31 (×7): qty 1

## 2018-01-31 MED ORDER — LOSARTAN POTASSIUM 50 MG PO TABS
50.0000 mg | ORAL_TABLET | Freq: Every day | ORAL | Status: DC
  Administered 2018-01-31 – 2018-02-02 (×3): 50 mg via ORAL
  Filled 2018-01-31 (×3): qty 1

## 2018-01-31 MED ORDER — SERTRALINE HCL 50 MG PO TABS
100.0000 mg | ORAL_TABLET | Freq: Two times a day (BID) | ORAL | Status: DC
  Administered 2018-01-31 – 2018-02-02 (×5): 100 mg via ORAL
  Filled 2018-01-31 (×5): qty 2

## 2018-01-31 MED ORDER — HYDROCHLOROTHIAZIDE 25 MG PO TABS
25.0000 mg | ORAL_TABLET | Freq: Every day | ORAL | Status: DC
  Administered 2018-01-31 – 2018-02-02 (×3): 25 mg via ORAL
  Filled 2018-01-31 (×3): qty 1

## 2018-01-31 MED ORDER — SODIUM CHLORIDE 0.9 % IV BOLUS
1000.0000 mL | Freq: Once | INTRAVENOUS | Status: AC
  Administered 2018-01-31: 1000 mL via INTRAVENOUS

## 2018-01-31 MED ORDER — HEPARIN SODIUM (PORCINE) 5000 UNIT/ML IJ SOLN
5000.0000 [IU] | Freq: Three times a day (TID) | INTRAMUSCULAR | Status: DC
  Administered 2018-01-31 – 2018-02-02 (×7): 5000 [IU] via SUBCUTANEOUS
  Filled 2018-01-31 (×7): qty 1

## 2018-01-31 MED ORDER — DOCUSATE SODIUM 100 MG PO CAPS
100.0000 mg | ORAL_CAPSULE | Freq: Two times a day (BID) | ORAL | Status: DC
  Administered 2018-01-31 – 2018-02-02 (×5): 100 mg via ORAL
  Filled 2018-01-31 (×5): qty 1

## 2018-01-31 MED ORDER — METOPROLOL TARTRATE 5 MG/5ML IV SOLN: 5.0000 mg | Freq: Four times a day (QID) | INTRAVENOUS | Status: DC | PRN

## 2018-01-31 MED ORDER — LEVOTHYROXINE SODIUM 100 MCG PO TABS
100.0000 ug | ORAL_TABLET | Freq: Every day | ORAL | Status: DC
  Administered 2018-02-01 – 2018-02-02 (×2): 100 ug via ORAL
  Filled 2018-01-31 (×2): qty 1

## 2018-01-31 MED ORDER — ONDANSETRON HCL 4 MG/2ML IJ SOLN
4.0000 mg | Freq: Four times a day (QID) | INTRAMUSCULAR | Status: DC | PRN
  Administered 2018-02-01 (×2): 4 mg via INTRAVENOUS
  Filled 2018-01-31 (×2): qty 2

## 2018-01-31 MED ORDER — SIMETHICONE 80 MG PO CHEW: 40.0000 mg | CHEWABLE_TABLET | Freq: Four times a day (QID) | ORAL | Status: DC | PRN

## 2018-01-31 MED ORDER — DIPHENHYDRAMINE HCL 50 MG/ML IJ SOLN: 12.5000 mg | Freq: Four times a day (QID) | INTRAMUSCULAR | Status: DC | PRN

## 2018-01-31 MED ORDER — ONDANSETRON 4 MG PO TBDP: 4.0000 mg | ORAL_TABLET | Freq: Four times a day (QID) | ORAL | Status: DC | PRN

## 2018-01-31 MED ORDER — INSULIN ASPART 100 UNIT/ML ~~LOC~~ SOLN
0.0000 [IU] | Freq: Three times a day (TID) | SUBCUTANEOUS | Status: DC
  Administered 2018-01-31: 3 [IU] via SUBCUTANEOUS
  Administered 2018-01-31: 8 [IU] via SUBCUTANEOUS
  Administered 2018-02-01 (×2): 5 [IU] via SUBCUTANEOUS
  Administered 2018-02-01 – 2018-02-02 (×3): 8 [IU] via SUBCUTANEOUS

## 2018-01-31 MED ORDER — FENTANYL CITRATE (PF) 100 MCG/2ML IJ SOLN
100.0000 ug | Freq: Once | INTRAMUSCULAR | Status: AC
  Administered 2018-01-31: 100 ug via INTRAVENOUS
  Filled 2018-01-31: qty 2

## 2018-01-31 MED ORDER — INSULIN ASPART 100 UNIT/ML ~~LOC~~ SOLN
0.0000 [IU] | Freq: Every day | SUBCUTANEOUS | Status: DC
  Administered 2018-01-31 – 2018-02-01 (×2): 2 [IU] via SUBCUTANEOUS

## 2018-01-31 MED ORDER — DIPHENHYDRAMINE HCL 12.5 MG/5ML PO ELIX
12.5000 mg | ORAL_SOLUTION | Freq: Four times a day (QID) | ORAL | Status: DC | PRN
Start: 2018-01-31 — End: 2018-02-02

## 2018-01-31 MED ORDER — ACETAMINOPHEN 325 MG PO TABS
650.0000 mg | ORAL_TABLET | Freq: Four times a day (QID) | ORAL | Status: DC | PRN
  Administered 2018-01-31 – 2018-02-02 (×2): 650 mg via ORAL
  Filled 2018-01-31 (×2): qty 2

## 2018-01-31 MED ORDER — OXYCODONE HCL 5 MG PO TABS
5.0000 mg | ORAL_TABLET | ORAL | Status: DC | PRN
  Administered 2018-01-31 (×2): 10 mg via ORAL
  Filled 2018-01-31 (×2): qty 2

## 2018-01-31 MED ORDER — FENTANYL CITRATE (PF) 100 MCG/2ML IJ SOLN
100.0000 ug | Freq: Once | INTRAMUSCULAR | Status: AC
Start: 2018-01-31 — End: 2018-01-31
  Administered 2018-01-31: 100 ug via INTRAVENOUS
  Filled 2018-01-31: qty 2

## 2018-01-31 MED ORDER — PANTOPRAZOLE SODIUM 40 MG PO TBEC
40.0000 mg | DELAYED_RELEASE_TABLET | Freq: Every day | ORAL | Status: DC
  Administered 2018-02-01 – 2018-02-02 (×2): 40 mg via ORAL
  Filled 2018-01-31 (×2): qty 1

## 2018-01-31 NOTE — H&P (Signed)
[facility] History and Physical  Reason for Referral: Acute post operative pain  Referring Physician:  Dr. Christy Gentles   Chief Complaint    Back Pain      Katherine Bell is a 67 y.o. female.  HPI: [name] is a well known to me and presented to the ED at [facility] with right shoulder pain going into her neck and some desaturations. She has already been to the ED at [facility] and labs and CXR were ok. The ED physician there Dr. Freda Munro notified me, and said the patient's pain was controlled with dilaudid and she was sent home. The patient continued to have pain and called me in a panic. I told her to go to [facility] to get evaluated further. On arrival she was having some desaturation and her CXR demonstrated a small area of either infiltrate versus atelectasis. Given this and the desaturations and CT PE protocol was done to rule out PE, and this was negative.  The patient is now comfortable and resting and wants to drink/ eat. Her shoulder pain is improved with IV pain medications. She had some nausea/vomiting with the pain overnight and pain medication by mouth but no further nausea/vomiting or fevers reported.   Past Medical History:  Diagnosis Date  . Acute blood loss as cause of postoperative anemia 08/19/2017  . Anemia of chronic disease 08/19/2017  . Anxiety   . Cirrhosis, non-alcoholic ([place])   . Corneal ulcer, left   . Diabetes mellitus without complication ([place])   . GERD (gastroesophageal reflux disease)   . History of kidney stones   . Hypercholesteremia   . Hypertension   . Hypothyroidism   . Osteoarthritis of left knee   . Primary localized osteoarthrosis of the knee, left 08/06/2017  . Sleep apnea, obstructive    used for a year -off for a year    Past Surgical History:  Procedure Laterality Date  . ABDOMINAL HYSTERECTOMY  1996  . BLADDER SUSPENSION  1997   4 different procedures  . CATARACT EXTRACTION W/ INTRAOCULAR LENS IMPLANT Left 2004  .  DACROCYSTORHINOSTOMY W/ JONES TUBE Left 2004   for tear duct  . ESOPHAGOGASTRODUODENOSCOPY N/A 11/21/2017   Procedure: ESOPHAGOGASTRODUODENOSCOPY (EGD);  Surgeon: Rogene Houston, MD;  Location: [facility];  Service: Endoscopy;  Laterality: N/A;  225  . EYE SURGERY    . TOTAL KNEE ARTHROPLASTY Left 08/18/2017   Procedure: LEFT TOTAL KNEE ARTHROPLASTY;  Surgeon: Elsie Saas, MD;  Location: [facility];  Service: Orthopedics;  Laterality: Left;    Family History  Problem Relation Age of Onset  . Suicidality Father   . Diabetes Brother   . Hypertension Brother   . Heart disease Brother     Social History   Tobacco Use  . Smoking status: Never Smoker  . Smokeless tobacco: Never Used  Substance Use Topics  . Alcohol use: No  . Drug use: No    Medications:  I have reviewed the patient's current medications. Prior to Admission:  (Not in a hospital admission) Scheduled: . acyclovir  400 mg Oral BID  . atorvastatin  20 mg Oral Daily  . docusate sodium  100 mg Oral BID  . heparin  5,000 Units Subcutaneous Q8H  . hydrochlorothiazide  25 mg Oral Daily  . insulin aspart  0-15 Units Subcutaneous TID WC  . insulin aspart  0-5 Units Subcutaneous QHS  . [START ON 02/01/2018] levothyroxine  100 mcg Oral QAC breakfast  . losartan  50 mg Oral Daily  . [START ON 02/01/2018] pantoprazole  40 mg Oral QAC breakfast  . sertraline  100 mg Oral BID   Continuous:  TML:[id number], diphenhydrAMINE **OR** diphenhydrAMINE, metoprolol tartrate, morphine injection, ondansetron **OR** ondansetron (ZOFRAN) IV, oxyCODONE, simethicone Allergies: Allergies  Allergen Reactions  . Penicillins Hives, Itching and Other (See Comments)    Has taken keflex without difficulty      Has patient had a PCN reaction causing immediate rash, facial/tongue/throat swelling, SOB or lightheadedness with hypotension: Yes Has patient had a PCN reaction causing severe rash involving mucus membranes or skin necrosis: No   Has patient had a PCN reaction that required hospitalization: No Has patient had a PCN reaction occurring within the last 10 years: No If all of the above answers are "NO", then may proceed with Cephalosporin use.   . Sulfa Antibiotics Hives      ROS:  A comprehensive review of systems was negative except for: Gastrointestinal: positive for nausea Musculoskeletal: positive for right shoulder pain  Blood pressure 105/60, pulse (!) 105, temperature 97.6 F (36.4 C), temperature source Oral, resp. rate (!) 22, height '5\' 7"'  (1.702 m), weight 184 lb (83.5 kg), SpO2 91 %. Physical Exam  Results: Results for orders placed or performed during the hospital encounter of 01/31/18 (from the past 48 hour(s))  Comprehensive metabolic panel     Status: Abnormal   Collection Time: 01/31/18  6:46 AM  Result Value Ref Range   Sodium 137 135 - 145 mmol/L   Potassium 3.8 3.5 - 5.1 mmol/L   Chloride 96 (L) 101 - 111 mmol/L   CO2 25 22 - 32 mmol/L   Glucose, Bld 297 (H) 65 - 99 mg/dL   BUN 25 (H) 6 - 20 mg/dL   Creatinine, Ser 0.96 0.44 - 1.00 mg/dL   Calcium 8.9 8.9 - 10.3 mg/dL   Total Protein 7.6 6.5 - 8.1 g/dL   Albumin 4.5 3.5 - 5.0 g/dL   AST 63 (H) 15 - 41 U/L   ALT 61 (H) 14 - 54 U/L   Alkaline Phosphatase 93 38 - 126 U/L   Total Bilirubin 1.0 0.3 - 1.2 mg/dL   GFR calc non Af Amer 59 (L) >60 mL/min   GFR calc Af Amer >60 >60 mL/min    Comment: (NOTE) The eGFR has been calculated using the CKD EPI equation. This calculation has not been validated in all clinical situations. eGFR's persistently <60 mL/min signify possible Chronic Kidney Disease.    Anion gap 16 (H) 5 - 15    Comment: Performed at [facility], [address], [place], [place] [address]  CBC with Differential/Platelet     Status: Abnormal   Collection Time: 01/31/18  6:46 AM  Result Value Ref Range   WBC 7.9 4.0 - 10.5 K/uL   RBC 5.00 3.87 - 5.11 MIL/uL   Hemoglobin 12.0 12.0 - 15.0 g/dL   HCT 37.6 36.0 - 46.0 %    MCV 75.2 (L) 78.0 - 100.0 fL   MCH 24.0 (L) 26.0 - 34.0 pg   MCHC 31.9 30.0 - 36.0 g/dL   RDW 15.7 (H) 11.5 - 15.5 %   Platelets 141 (L) 150 - 400 K/uL   Neutrophils Relative % 88 %   Neutro Abs 7.0 1.7 - 7.7 K/uL   Lymphocytes Relative 6 %   Lymphs Abs 0.5 (L) 0.7 - 4.0 K/uL   Monocytes Relative 6 %   Monocytes Absolute 0.5 0.1 - 1.0 K/uL  Eosinophils Relative 0 %   Eosinophils Absolute 0.0 0.0 - 0.7 K/uL   Basophils Relative 0 %   Basophils Absolute 0.0 0.0 - 0.1 K/uL    Comment: Performed at [facility], [address] [place], [place] [address]  Troponin I     Status: None   Collection Time: 01/31/18  6:46 AM  Result Value Ref Range   Troponin I <0.03 <0.03 ng/mL    Comment: Performed at [facility], [address] [place], [place] [address]  Protime-INR     Status: None   Collection Time: 01/31/18  6:46 AM  Result Value Ref Range   Prothrombin Time 14.8 11.4 - 15.2 seconds   INR 1.17     Comment: Performed at [facility], [address] [place], [place] [address]  APTT     Status: None   Collection Time: 01/31/18  6:46 AM  Result Value Ref Range   aPTT 31 24 - 36 seconds    Comment: Performed at [facility], [address], [place], [place] [address]  Lipase, blood     Status: None   Collection Time: 01/31/18  6:46 AM  Result Value Ref Range   Lipase 34 11 - 51 U/L    Comment: Performed at [facility], [address] [place],  [address]   Personally reviewed- CT with no PE, some R middle lobe base with some atelectasis, gallbladder fossa with surgical Snow in place, no evidence of leak or infection   Ct Angio Chest Pe W And/or Wo Contrast  Result Date: 01/31/2018 CLINICAL DATA:  Status post cholecystectomy 1 day ago. Right shoulder pain after surgery. Difficulty breathing. EXAM: CT ANGIOGRAPHY CHEST WITH CONTRAST TECHNIQUE: Multidetector CT imaging of the chest was performed using the standard protocol during bolus administration of intravenous contrast.  Multiplanar CT image reconstructions and MIPs were obtained to evaluate the vascular anatomy. CONTRAST:  145m ISOVUE-370 IOPAMIDOL (ISOVUE-370) INJECTION 76% COMPARISON:  Chest x-ray January 31, 2018 and chest CT August 22, 2017 FINDINGS: Cardiovascular: Atherosclerotic changes seen in the nonaneurysmal thoracic aorta. No aortic dissection. The heart is unremarkable. Evaluation of the pulmonary arteries is limited due to respiratory motion beyond the segmental branch level. No pulmonary emboli are identified. Mediastinum/Nodes: There is a calcified nodule in the right thyroid lobe. There is some fluid in the distal esophagus suggesting reflux. The esophagus is otherwise normal. No adenopathy. There is a tiny right pleural effusion. No left-sided pleural effusion or pericardial effusion. Lungs/Pleura: Central airways are normal. No pneumothorax. There is opacity in the medial right lung base with air bronchograms. While there may be a component of atelectasis, the findings are suspicious for infiltrate as well in this region suggesting pneumonia or aspiration. Mild interlobular septal thickening in the apices suggest pulmonary venous congestion/mild edema. Atelectasis seen in the left lung base. No nodule or mass. Upper Abdomen: Air and fluid in the gallbladder fossa is identified. Cholecystectomy clips are noted. A small amount of pericholecystic fluid and air identified more anteriorly as well. Free intraperitoneal air consistent with recent surgery. Splenomegaly identified and similar in the interval. No other acute abnormalities in the upper abdomen. Musculoskeletal: No chest wall abnormality. No acute or significant osseous findings. Review of the MIP images confirms the above findings. IMPRESSION: 1. No pulmonary emboli identified. 2. The opacity in the medial right lung base is suspicious for infiltrate. There is likely a component of atelectasis as well. Recommend follow-up to resolution. 3. Mild pulmonary  venous congestion/edema. 4. Fluid  and air in the gallbladder fossa with a small amount of fluid and air adjacent to the liver. These findings could all be postoperative given cholecystectomy yesterday. Similar findings could be seen in the setting of an abscess or bile leak. An abscess is considered less likely given only 1 day since surgery. Recommend clinical correlation. 5. Atherosclerotic changes in the nonaneurysmal aorta. Aortic Atherosclerosis (ICD10-I70.0). Electronically Signed   By: Dorise Bullion III M.D   On: 01/31/2018 09:04   Dg Chest Port 1 View  Result Date: 01/31/2018 CLINICAL DATA:  Shortness of breath. EXAM: PORTABLE CHEST 1 VIEW COMPARISON:  01/30/2018 FINDINGS: Stable appearance of the cardiomediastinal contours. There are low lung volumes with asymmetric elevation of right hemidiaphragm. Platelike atelectasis noted in both lung bases. IMPRESSION: 1. Persistent low lung volumes with asymmetric elevation of the right hemidiaphragm. 2. Bibasilar atelectasis. Electronically Signed   By: Kerby Moors M.D.   On: 01/31/2018 07:40     Assessment & Plan:  Juli DALANIE KISNER is a 67 y.o. female with post operative pain consistent with air trapping below the diaphragm causing the shoulder pain and then patient not breathing deeply and having some atelectasis and desaturations. Now on RA and satting well.  CTA negative. The gallbladder fossa is consistent with Surgical Snow placement and not leak or infection.   -Diet as tolerated -SSI while in hospital after IV contrast  -encourage hydration  -PRN for pain control -Ambulate  -Aggressive Pulmonary toilet and IS, respiratory and RN orders placed, repeat CXR in the AM  -SCDs, heparin sq -Home meds ordered  -Repeat CBC in the AM, have very low suspicion for any pneumonia  -Likely home tomorrow   All questions were answered to the satisfaction of the patient.  Virl Cagey 01/31/2018, 10:18 AM

## 2018-01-31 NOTE — ED Notes (Signed)
Bedside commode brought in. Pt assisted by NT.

## 2018-01-31 NOTE — ED Notes (Signed)
Spoke with Dr. Christy Gentles and updated him on vital signs, placed pt on nasal can @ 2L. Pt O2 stat rising to 91%

## 2018-01-31 NOTE — ED Notes (Signed)
Respiratory paged at this time to bring pt incentive spirometer.

## 2018-01-31 NOTE — Progress Notes (Signed)
Patient refused to use IS at this time because she was " in too much pain." She stated she knew how to use the IS and would have family bring in hers (from home ) from a previous recent admission. Once the patient is admitted to the floor, pain is controlled (to assist with compliance) and her IS is brought in RT will review its use and frequency. If the IS isn't made available from home one will be provide to her and instruction given.

## 2018-01-31 NOTE — ED Provider Notes (Signed)
Results, and patient progress discussed with Dr. Constance Haw, her surgeon.  We discussed the case results and findings.  Dr. Constance Haw will see the patient in 20 minutes or so, make a decision on antibiotic treatment, and arrange for admission.   Daleen Bo, MD 01/31/18 [phone]

## 2018-01-31 NOTE — Progress Notes (Signed)
Notified Dr. Constance Haw that pt had a temperature of 103.1. She told me to give the pt tylenol and to instruct the pt to ambulate in the hall and to use the IS every 30 minutes or at every commercial.

## 2018-01-31 NOTE — ED Notes (Signed)
Pt calling out asking for more pain medication. Will refer to PRN orders.

## 2018-01-31 NOTE — ED Notes (Signed)
X-ray at bedside

## 2018-01-31 NOTE — Progress Notes (Signed)
Informed RN patient needs to do IS; fever likely from atelectasis. Patient has refused all day per the RN to do the IS despite the orders.   Explained to RN CTA chest negative except for atelectasis, ct saw gallbladder and no issues. Labs good.  Will likely develop a PNA if does not do IS as instructed. Need to do IS every 30 min and pull volumes >1000 until bed.   Katherine Bell

## 2018-01-31 NOTE — ED Notes (Signed)
Pt returned from CT °

## 2018-01-31 NOTE — ED Notes (Signed)
Pt transported to CT ?

## 2018-01-31 NOTE — ED Provider Notes (Signed)
[facility] EMERGENCY DEPARTMENT Provider Note   CSN: [id number] Arrival date & time: 01/31/18  5176     History   Chief Complaint Chief Complaint  Patient presents with  . Back Pain    Upper right side shoulder pain 1 day post op gall bladder removal    Level 5 caveat due to acuity condition HPI Mekiyah H Smits is a 67 y.o. female.  The history is provided by the patient and a significant other. The history is limited by the condition of the patient.  Back Pain   This is a new problem. The current episode started 12 to 24 hours ago. The problem occurs constantly. The problem has been rapidly worsening. The pain is associated with no known injury. Pain location: Right upper back/right shoulder. The quality of the pain is described as stabbing. The pain is at a severity of 10/10. The pain is severe. Exacerbated by: Breathing. Associated symptoms include a fever and abdominal pain. Pertinent negatives include no chest pain. Treatments tried: Pain medications. The treatment provided no relief.   Patient with a history of diabetes, GERD presents with upper back pain.  She underwent a cholecystectomy yesterday.  She reports after the surgery she had some right shoulder pain.  Over the day it has been worsening.  She now reports pain with breathing.  She reports the pain is so severe she has vomiting.  She went to an outside hospital for evaluation, was told it was "gas" received some pain medications and went home.  She does report having a fever above 100 at the outside facility  she reports since then she has been having vomiting She denies known history of CAD/PE/DVT.  Past Medical History:  Diagnosis Date  . Acute blood loss as cause of postoperative anemia 08/19/2017  . Anemia of chronic disease 08/19/2017  . Anxiety   . Cirrhosis, non-alcoholic ([place])   . Corneal ulcer, left   . Diabetes mellitus without complication ([place])   . GERD (gastroesophageal reflux disease)   . History  of kidney stones   . Hypercholesteremia   . Hypertension   . Hypothyroidism   . Osteoarthritis of left knee   . Primary localized osteoarthrosis of the knee, left 08/06/2017  . Sleep apnea, obstructive    used for a year -off for a year    Patient Active Problem List   Diagnosis Date Noted  . Diarrhea 12/11/2017  . Lower abdominal pain 12/11/2017  . Calculus of gallbladder without cholecystitis without obstruction 12/11/2017  . Non-intractable vomiting with nausea 11/10/2017  . Fatty liver 10/20/2017  . Pancytopenia ([place]) 09/12/2017  . Microcytic anemia 09/12/2017  . Splenomegaly   . Primary localized osteoarthrosis of the knee, left 08/06/2017  . Sleep apnea, obstructive   . Osteoarthritis of left knee   . Hypothyroidism   . Hypertension   . Hypercholesteremia   . GERD (gastroesophageal reflux disease)   . Diabetes mellitus without complication ([place])   . Corneal ulcer, left   . Anxiety     Past Surgical History:  Procedure Laterality Date  . ABDOMINAL HYSTERECTOMY  1996  . BLADDER SUSPENSION  1997   4 different procedures  . CATARACT EXTRACTION W/ INTRAOCULAR LENS IMPLANT Left 2004  . DACROCYSTORHINOSTOMY W/ JONES TUBE Left 2004   for tear duct  . ESOPHAGOGASTRODUODENOSCOPY N/A 11/21/2017   Procedure: ESOPHAGOGASTRODUODENOSCOPY (EGD);  Surgeon: Rogene Houston, MD;  [facility]: [facility];  Service: Endoscopy;  Laterality: N/A;  225  . EYE SURGERY    .  TOTAL KNEE ARTHROPLASTY Left 08/18/2017   Procedure: LEFT TOTAL KNEE ARTHROPLASTY;  Surgeon: Elsie Saas, MD;  Location: [facility];  Service: Orthopedics;  Laterality: Left;     OB History   None      Home Medications    Prior to Admission medications   Medication Sig Start Date End Date Taking? Authorizing Provider  acetaminophen (TYLENOL) 325 MG tablet Take 2 tablets (650 mg total) by mouth every 6 (six) hours as needed for mild pain (or Fever >/= 101). Patient taking differently: Take 650 mg by mouth at  bedtime as needed for mild pain (or Fever >/= 101).  08/19/17   Shepperson, Kirstin, PA-C  acyclovir (ZOVIRAX) 400 MG tablet Take 400 mg by mouth 2 (two) times daily.    [provider]  atorvastatin (LIPITOR) 40 MG tablet Take 20 mg by mouth daily.     [provider]  docusate sodium (COLACE) 100 MG capsule Take 1 capsule (100 mg total) by mouth 2 (two) times daily. 01/30/18 01/30/19  Virl Cagey, MD  glipiZIDE (GLUCOTROL) 5 MG tablet Take 5 mg by mouth 2 (two) times daily before a meal.    [provider]  hydrochlorothiazide (HYDRODIURIL) 25 MG tablet Take 25 mg by mouth daily.    [provider]  levothyroxine (SYNTHROID, LEVOTHROID) 100 MCG tablet Take 100 mcg by mouth daily before breakfast.    [provider]  losartan (COZAAR) 50 MG tablet Take 50 mg by mouth daily.     [provider]  metFORMIN (GLUCOPHAGE) 1000 MG tablet Take 1,000 mg by mouth 2 (two) times daily with a meal.    [provider]  oxyCODONE (ROXICODONE) 5 MG immediate release tablet Take 1 tablet (5 mg total) by mouth every 4 (four) hours as needed. 01/30/18 01/30/19  Virl Cagey, MD  pantoprazole (PROTONIX) 40 MG tablet Take 1 tablet (40 mg total) by mouth daily before breakfast. 11/21/17   Rehman, Mechele Dawley, MD  prednisoLONE acetate (PRED FORTE) 1 % ophthalmic suspension Place 1 drop into the left eye as needed (eye ulcer flare).    [provider]  sertraline (ZOLOFT) 100 MG tablet Take 100 mg by mouth 2 (two) times daily.    [provider]  trifluridine (VIROPTIC) 1 % ophthalmic solution Place 1 drop into the left eye as needed (eye ulcer flare).    [provider]    Family History Family History  Problem Relation Age of Onset  . Suicidality Father   . Diabetes Brother   . Hypertension Brother   . Heart disease Brother     Social History Social History   Tobacco Use  . Smoking status: Never Smoker  .  Smokeless tobacco: Never Used  Substance Use Topics  . Alcohol use: No  . Drug use: No     Allergies   Penicillins and Sulfa antibiotics   Review of Systems Review of Systems  Unable to perform ROS: Acuity of condition  Constitutional: Positive for fever.  Cardiovascular: Negative for chest pain.  Gastrointestinal: Positive for abdominal pain.  Musculoskeletal: Positive for back pain.     Physical Exam Updated Vital Signs BP (!) 147/77   Pulse (!) 106   Temp 97.6 F (36.4 C) (Oral)   Resp (!) 30   SpO2 92%  Initial pulse ox 80% Physical Exam CONSTITUTIONAL: Ill-appearing HEAD: Normocephalic/atraumatic EYES: EOMI/PERRL ENMT: Mucous membranes dry NECK: supple no meningeal signs SPINE/BACK:entire spine nontender CV: S1/S2 noted, tachycardic LUNGS:, Decreased  breath sounds bilaterally ABDOMEN: soft, mildly distended mild diffuse tenderness, well-healing incisions GU:no cva tenderness NEURO: Pt is awake/alert/appropriate, moves all extremitiesx4.  No facial droop.   EXTREMITIES: pulses normal/equal x4, full ROM SKIN: warm, color normal PSYCH: Anxious  ED Treatments / Results  Labs (all labs ordered are listed, but only abnormal results are displayed) Labs Reviewed  COMPREHENSIVE METABOLIC PANEL - Abnormal; Notable for the following components:      Result Value   Chloride 96 (*)    Glucose, Bld 297 (*)    BUN 25 (*)    AST 63 (*)    ALT 61 (*)    GFR calc non Af Amer 59 (*)    Anion gap 16 (*)    All other components within normal limits  CBC WITH DIFFERENTIAL/PLATELET - Abnormal; Notable for the following components:   MCV 75.2 (*)    MCH 24.0 (*)    RDW 15.7 (*)    Platelets 141 (*)    Lymphs Abs 0.5 (*)    All other components within normal limits  TROPONIN I  PROTIME-INR  APTT  LIPASE, BLOOD    EKG EKG Interpretation  Date/Time:  Saturday January 31 2018 06:46:14 EDT Ventricular Rate:  106 PR Interval:    QRS Duration: 103 QT  Interval:  395 QTC Calculation: 525 R Axis:   41 Text Interpretation:  Sinus tachycardia Atrial premature complex Low voltage, extremity and precordial leads Prolonged QT interval Confirmed by Ripley Fraise [phone]) on 01/31/2018 7:00:37 AM   Radiology Dg Chest Port 1 View  Result Date: 01/31/2018 CLINICAL DATA:  Shortness of breath. EXAM: PORTABLE CHEST 1 VIEW COMPARISON:  01/30/2018 FINDINGS: Stable appearance of the cardiomediastinal contours. There are low lung volumes with asymmetric elevation of right hemidiaphragm. Platelike atelectasis noted in both lung bases. IMPRESSION: 1. Persistent low lung volumes with asymmetric elevation of the right hemidiaphragm. 2. Bibasilar atelectasis. Electronically Signed   By: Kerby Moors M.D.   On: 01/31/2018 07:40    Procedures Procedures  CRITICAL CARE Performed by: Sharyon Cable Total critical care time: 35 minutes Critical care time was exclusive of separately billable procedures and treating other patients. Critical care was necessary to treat or prevent imminent or life-threatening deterioration. Critical care was time spent personally by me on the following activities: development of treatment plan with patient and/or surrogate as well as nursing, discussions with consultants, evaluation of patient's response to treatment, examination of patient, obtaining history from patient or surrogate, ordering and performing treatments and interventions, ordering and review of laboratory studies, ordering and review of radiographic studies, pulse oximetry and re-evaluation of patient's condition. Patient with tachycardia, pleurisy, hypoxia with pulse ox to 80% Oxygen was applied the patient, patient was given IV fluids and IV pain medicine. Medications Ordered in ED Medications  fentaNYL (SUBLIMAZE) injection 100 mcg (100 mcg Intravenous Given 01/31/18 0709)  sodium chloride 0.9 % bolus 1,000 mL (1,000 mLs Intravenous New Bag/Given 01/31/18 0709)      Initial Impression / Assessment and Plan / ED Course  I have reviewed the triage vital signs and the nursing notes.  Pertinent labs & imaging results that were available during my care of the patient were reviewed by me and considered in my medical decision making (see chart for details).     7:06 AM Patient presents with right upper back and shoulder pain since having a cholecystectomy yesterday.  She reports it is pleuritic.  Initial pulse ox was 80%.  This is improving.  She does report fevers in the past 12 hours. However, I am concerned about a PE.  Labs and imaging ordered. 7:28 AM Discussed with Dr. Constance Haw, her surgeon from yesterday. If patient is not having significant abdominal pain, defer CT abdomen pelvis. However if PE is suspected, we should proceed with CT chest She will see patient in the hospital 7:57 AM Overall patient is improving.  She reports some mild abdominal pain.  She still reports some pleuritic pain in her shoulder.  CT chest ordered to evaluate for PE.  Signed out to Dr. Eulis Foster who will manage patient in follow-up with CT chest Final Clinical Impressions(s) / ED Diagnoses   Final diagnoses:  [place]  Hypoxia    ED Discharge Orders    None       Ripley Fraise, MD 01/31/18 [id number]

## 2018-01-31 NOTE — ED Notes (Signed)
Pt requesting more pain medication. EDP notified. Pt also given mouth swabs.

## 2018-02-01 ENCOUNTER — Observation Stay (HOSPITAL_COMMUNITY): Payer: Medicare Other

## 2018-02-01 DIAGNOSIS — Z8249 Family history of ischemic heart disease and other diseases of the circulatory system: Secondary | ICD-10-CM | POA: Diagnosis not present

## 2018-02-01 DIAGNOSIS — E039 Hypothyroidism, unspecified: Secondary | ICD-10-CM | POA: Diagnosis present

## 2018-02-01 DIAGNOSIS — I10 Essential (primary) hypertension: Secondary | ICD-10-CM | POA: Diagnosis present

## 2018-02-01 DIAGNOSIS — K8010 Calculus of gallbladder with chronic cholecystitis without obstruction: Secondary | ICD-10-CM | POA: Diagnosis not present

## 2018-02-01 DIAGNOSIS — J181 Lobar pneumonia, unspecified organism: Secondary | ICD-10-CM

## 2018-02-01 DIAGNOSIS — Z833 Family history of diabetes mellitus: Secondary | ICD-10-CM | POA: Diagnosis not present

## 2018-02-01 DIAGNOSIS — D6959 Other secondary thrombocytopenia: Secondary | ICD-10-CM | POA: Diagnosis present

## 2018-02-01 DIAGNOSIS — R091 Pleurisy: Secondary | ICD-10-CM

## 2018-02-01 DIAGNOSIS — G8918 Other acute postprocedural pain: Principal | ICD-10-CM

## 2018-02-01 DIAGNOSIS — K7460 Unspecified cirrhosis of liver: Secondary | ICD-10-CM | POA: Diagnosis present

## 2018-02-01 DIAGNOSIS — R0902 Hypoxemia: Secondary | ICD-10-CM | POA: Diagnosis not present

## 2018-02-01 DIAGNOSIS — Z9049 Acquired absence of other specified parts of digestive tract: Secondary | ICD-10-CM | POA: Diagnosis not present

## 2018-02-01 DIAGNOSIS — J9811 Atelectasis: Secondary | ICD-10-CM | POA: Diagnosis not present

## 2018-02-01 DIAGNOSIS — F419 Anxiety disorder, unspecified: Secondary | ICD-10-CM | POA: Diagnosis present

## 2018-02-01 DIAGNOSIS — R112 Nausea with vomiting, unspecified: Secondary | ICD-10-CM

## 2018-02-01 DIAGNOSIS — Z7984 Long term (current) use of oral hypoglycemic drugs: Secondary | ICD-10-CM | POA: Diagnosis not present

## 2018-02-01 DIAGNOSIS — Z96652 Presence of left artificial knee joint: Secondary | ICD-10-CM | POA: Diagnosis present

## 2018-02-01 DIAGNOSIS — K3184 Gastroparesis: Secondary | ICD-10-CM | POA: Diagnosis present

## 2018-02-01 DIAGNOSIS — Z880 Allergy status to penicillin: Secondary | ICD-10-CM | POA: Diagnosis not present

## 2018-02-01 DIAGNOSIS — M25511 Pain in right shoulder: Secondary | ICD-10-CM | POA: Diagnosis present

## 2018-02-01 DIAGNOSIS — Z882 Allergy status to sulfonamides status: Secondary | ICD-10-CM | POA: Diagnosis not present

## 2018-02-01 DIAGNOSIS — J984 Other disorders of lung: Secondary | ICD-10-CM | POA: Diagnosis not present

## 2018-02-01 DIAGNOSIS — D696 Thrombocytopenia, unspecified: Secondary | ICD-10-CM | POA: Diagnosis not present

## 2018-02-01 DIAGNOSIS — K219 Gastro-esophageal reflux disease without esophagitis: Secondary | ICD-10-CM | POA: Diagnosis present

## 2018-02-01 DIAGNOSIS — F329 Major depressive disorder, single episode, unspecified: Secondary | ICD-10-CM | POA: Diagnosis present

## 2018-02-01 DIAGNOSIS — M1990 Unspecified osteoarthritis, unspecified site: Secondary | ICD-10-CM | POA: Diagnosis present

## 2018-02-01 DIAGNOSIS — E785 Hyperlipidemia, unspecified: Secondary | ICD-10-CM | POA: Diagnosis present

## 2018-02-01 DIAGNOSIS — G4733 Obstructive sleep apnea (adult) (pediatric): Secondary | ICD-10-CM | POA: Diagnosis present

## 2018-02-01 DIAGNOSIS — E1143 Type 2 diabetes mellitus with diabetic autonomic (poly)neuropathy: Secondary | ICD-10-CM | POA: Diagnosis present

## 2018-02-01 LAB — CBC WITH DIFFERENTIAL/PLATELET
BASOS ABS: 0 10*3/uL (ref 0.0–0.1)
Basophils Relative: 0 %
Eosinophils Absolute: 0 10*3/uL (ref 0.0–0.7)
Eosinophils Relative: 0 %
HEMATOCRIT: 32.1 % — AB (ref 36.0–46.0)
Hemoglobin: 10.1 g/dL — ABNORMAL LOW (ref 12.0–15.0)
LYMPHS PCT: 10 %
Lymphs Abs: 0.5 10*3/uL — ABNORMAL LOW (ref 0.7–4.0)
MCH: 24 pg — ABNORMAL LOW (ref 26.0–34.0)
MCHC: 31.5 g/dL (ref 30.0–36.0)
MCV: 76.4 fL — AB (ref 78.0–100.0)
MONO ABS: 0.3 10*3/uL (ref 0.1–1.0)
Monocytes Relative: 6 %
NEUTROS ABS: 3.7 10*3/uL (ref 1.7–7.7)
Neutrophils Relative %: 84 %
Platelets: 89 10*3/uL — ABNORMAL LOW (ref 150–400)
RBC: 4.2 MIL/uL (ref 3.87–5.11)
RDW: 16 % — AB (ref 11.5–15.5)
WBC: 4.4 10*3/uL (ref 4.0–10.5)

## 2018-02-01 LAB — GLUCOSE, CAPILLARY
GLUCOSE-CAPILLARY: 204 mg/dL — AB (ref 65–99)
Glucose-Capillary: 224 mg/dL — ABNORMAL HIGH (ref 65–99)
Glucose-Capillary: 256 mg/dL — ABNORMAL HIGH (ref 65–99)
Glucose-Capillary: 222 mg/dL — ABNORMAL HIGH (ref 65–99)

## 2018-02-01 MED ORDER — PROMETHAZINE HCL 25 MG/ML IJ SOLN
12.5000 mg | Freq: Four times a day (QID) | INTRAMUSCULAR | Status: DC | PRN
  Administered 2018-02-01: 12.5 mg via INTRAVENOUS
  Filled 2018-02-01: qty 1

## 2018-02-01 MED ORDER — METOCLOPRAMIDE HCL 5 MG/ML IJ SOLN
5.0000 mg | Freq: Four times a day (QID) | INTRAMUSCULAR | Status: DC
  Administered 2018-02-01 – 2018-02-02 (×5): 5 mg via INTRAVENOUS
  Filled 2018-02-01 (×6): qty 2

## 2018-02-01 MED ORDER — MORPHINE SULFATE (PF) 2 MG/ML IV SOLN: 1.0000 mg | INTRAVENOUS | Status: DC | PRN

## 2018-02-01 MED ORDER — ONDANSETRON HCL 4 MG/2ML IJ SOLN
4.0000 mg | Freq: Three times a day (TID) | INTRAMUSCULAR | Status: DC
  Administered 2018-02-01 – 2018-02-02 (×4): 4 mg via INTRAVENOUS
  Filled 2018-02-01 (×4): qty 2

## 2018-02-01 MED ORDER — LEVOFLOXACIN IN D5W 750 MG/150ML IV SOLN
750.0000 mg | INTRAVENOUS | Status: DC
  Administered 2018-02-01 – 2018-02-02 (×2): 750 mg via INTRAVENOUS
  Filled 2018-02-01 (×2): qty 150

## 2018-02-01 MED ORDER — FENTANYL CITRATE (PF) 100 MCG/2ML IJ SOLN
12.5000 ug | INTRAMUSCULAR | Status: DC | PRN
  Administered 2018-02-01 – 2018-02-02 (×2): 12.5 ug via INTRAVENOUS
  Filled 2018-02-01 (×3): qty 2

## 2018-02-01 MED ORDER — POTASSIUM CHLORIDE IN NACL 20-0.45 MEQ/L-% IV SOLN
INTRAVENOUS | Status: DC
  Administered 2018-02-01: 15:00:00 via INTRAVENOUS
  Filled 2018-02-01 (×3): qty 1000

## 2018-02-01 NOTE — Progress Notes (Signed)
[facility] Progress Note     Subjective: Nausea and vomiting this AM. Vomited pintos from Friday. Again more consistent with gastroparesis which is what I warned the patient about prior to her surgery. Shoulder pain improved. Having some epigastric incisional pain now. Bloated. Looks down and not wanting to talk much. CXR with continued RML atelectasis/ infiltrate. Fevers but has not been doing good on IS.   Objective: Vital signs in last 24 hours: Temp:  [98.8 F (37.1 C)-103.1 F (39.5 C)] 99.8 F (37.7 C) (03/31 0500) Pulse Rate:  [101-126] 101 (03/31 0500) Resp:  [20-22] 21 (03/31 0500) BP: (91-140)/(56-73) 110/56 (03/31 0500) SpO2:  [90 %-98 %] 98 % (03/31 0500) Weight:  [184 lb (83.5 kg)] 184 lb (83.5 kg) (03/30 1341) Last BM Date: 01/29/18  Intake/Output from previous day: 03/30 0701 - 03/31 0700 In: 1120 [P.O.:120; IV Piggyback:1000] Out: -  Intake/Output this shift: Total I/O In: 120 [P.O.:120] Out: 300 [Urine:300]  General appearance: alert, cooperative and mild distress Resp: normal work breathing, low lung volumes, IS < 250  GI: soft, distended in epigastric area, appropriate tender, port sites c/d/i with dermabond no erythema or drainage  Lab Results:  Recent Labs    01/31/18 1044 02/01/18 0509  WBC 5.7 4.4  HGB 10.4* 10.1*  HCT 32.9* 32.1*  PLT 102* 89*   BMET Recent Labs    01/31/18 0646  NA 137  K 3.8  CL 96*  CO2 25  GLUCOSE 297*  BUN 25*  CREATININE 0.96  CALCIUM 8.9   PT/INR Recent Labs    01/31/18 0646  LABPROT 14.8  INR 1.17   CXR with atelectasis/ infiltrate on the right middle /lower lobe, low lung volumes  Studies/Results: Dg Chest 2 View  Result Date: 02/01/2018 CLINICAL DATA:  Nausea, pale, week EXAM: CHEST - 2 VIEW COMPARISON:  CT chest 01/31/2018 FINDINGS: Right lower lobe airspace disease which may reflect atelectasis versus pneumonia. Mild left basilar airspace disease likely reflecting atelectasis.  Chronic elevation of the right diaphragm. No pneumothorax. Stable cardiomediastinal silhouette. No aggressive osseous lesion. IMPRESSION: 1. Right lower lobe airspace disease which may reflect atelectasis versus pneumonia. Mild left basilar airspace disease likely reflecting atelectasis. Electronically Signed   By: Kathreen Devoid   On: 02/01/2018 10:35   Ct Angio Chest Pe W And/or Wo Contrast  Result Date: 01/31/2018 CLINICAL DATA:  Status post cholecystectomy 1 day ago. Right shoulder pain after surgery. Difficulty breathing. EXAM: CT ANGIOGRAPHY CHEST WITH CONTRAST TECHNIQUE: Multidetector CT imaging of the chest was performed using the standard protocol during bolus administration of intravenous contrast. Multiplanar CT image reconstructions and MIPs were obtained to evaluate the vascular anatomy. CONTRAST:  110mL ISOVUE-370 IOPAMIDOL (ISOVUE-370) INJECTION 76% COMPARISON:  Chest x-ray January 31, 2018 and chest CT August 22, 2017 FINDINGS: Cardiovascular: Atherosclerotic changes seen in the nonaneurysmal thoracic aorta. No aortic dissection. The heart is unremarkable. Evaluation of the pulmonary arteries is limited due to respiratory motion beyond the segmental branch level. No pulmonary emboli are identified. Mediastinum/Nodes: There is a calcified nodule in the right thyroid lobe. There is some fluid in the distal esophagus suggesting reflux. The esophagus is otherwise normal. No adenopathy. There is a tiny right pleural effusion. No left-sided pleural effusion or pericardial effusion. Lungs/Pleura: Central airways are normal. No pneumothorax. There is opacity in the medial right lung base with air bronchograms. While there may be a component of atelectasis, the findings are suspicious for infiltrate as well in this region suggesting pneumonia  or aspiration. Mild interlobular septal thickening in the apices suggest pulmonary venous congestion/mild edema. Atelectasis seen in the left lung base. No nodule or  mass. Upper Abdomen: Air and fluid in the gallbladder fossa is identified. Cholecystectomy clips are noted. A small amount of pericholecystic fluid and air identified more anteriorly as well. Free intraperitoneal air consistent with recent surgery. Splenomegaly identified and similar in the interval. No other acute abnormalities in the upper abdomen. Musculoskeletal: No chest wall abnormality. No acute or significant osseous findings. Review of the MIP images confirms the above findings. IMPRESSION: 1. No pulmonary emboli identified. 2. The opacity in the medial right lung base is suspicious for infiltrate. There is likely a component of atelectasis as well. Recommend follow-up to resolution. 3. Mild pulmonary venous congestion/edema. 4. Fluid and air in the gallbladder fossa with a small amount of fluid and air adjacent to the liver. These findings could all be postoperative given cholecystectomy yesterday. Similar findings could be seen in the setting of an abscess or bile leak. An abscess is considered less likely given only 1 day since surgery. Recommend clinical correlation. 5. Atherosclerotic changes in the nonaneurysmal aorta. Aortic Atherosclerosis (ICD10-I70.0). Electronically Signed   By: Dorise Bullion III M.D   On: 01/31/2018 09:04   Dg Chest Port 1 View  Result Date: 01/31/2018 CLINICAL DATA:  Shortness of breath. EXAM: PORTABLE CHEST 1 VIEW COMPARISON:  01/30/2018 FINDINGS: Stable appearance of the cardiomediastinal contours. There are low lung volumes with asymmetric elevation of right hemidiaphragm. Platelike atelectasis noted in both lung bases. IMPRESSION: 1. Persistent low lung volumes with asymmetric elevation of the right hemidiaphragm. 2. Bibasilar atelectasis. Electronically Signed   By: Kerby Moors M.D.   On: 01/31/2018 07:40    Anti-infectives: Anti-infectives (From admission, onward)   Start     Dose/Rate Route Frequency Ordered Stop   01/31/18 1030  acyclovir (ZOVIRAX) tablet  400 mg     400 mg Oral 2 times daily 01/31/18 1017        Assessment/Plan: Katherine Bell is a 67 yo with chronic nausea/vomiting who is POD 2 s/p laparoscopic cholecystectomy for stones but had uncontrolled right shoulder pain and now is having nausea and vomiting up food from 2 days ago. Her pain is improved and was likely related to the gas from the laparoscopic surgery. She continues to have possible infiltrate on her CXR and is pulling poorly on IS and is uncooperative with the RNs and RT regarding IS yesterday.  -PRN For pain -IS, OOB, Up to chair -CXR with continuted infiltrate, fevers, most likely this is related to atelectasis but differential with high neutrophil count, will ahead and treat for CAP at this time with Levaquin 750qday for 5 days  -Diet as tolerated, scheduled reglan given some degree of gastroparesis with vomiting up food from 2 days ago, scheduled zofran, and phenergan PRN, SSI for her Diabetes  -Urinating, will put on fluids since not taking in much po, IVF @ 50 -Labs in the AM, Thrombocytopenia to 89 and has dropped, but HIIT calculator does not indicate likelihood of any HIIT likely related to her chronic thrombocytopenia and her blood draws, will monitor  -SCDs, heparin sq    LOS: 0 days    Virl Cagey 02/01/2018

## 2018-02-01 NOTE — Plan of Care (Signed)
progressing 

## 2018-02-01 NOTE — Progress Notes (Signed)
Pt refused to ambulate. Robin and I moved her from the bed to the chair. She sat for about 30 minutes and then had to urinate. We walked her to the bathroom and then asked her if she wanted to ambulate in the hall. She refused. She said she wanted to lay in the bed. We encouraged her to sit back in the chair but she refused.  I have continued to hold PO meds because pt is nauseous.  Encouraged pt to use IS, but she said it hurts too much.

## 2018-02-01 NOTE — Consult Note (Addendum)
Consult Note                                                      Katherine Bell  EFE:[id number]  DOB: 28-Jan-1951  DOA: 01/31/2018  PCP: Arsenio Katz, NP   Outpatient Specialists:none   Requesting physician: Dr Constance Haw  Reason for consultation: Fever, N/V and poor affect  History of Present llness   Katherine Bell is an 67 y.o. female with history of NAF quit approximately LD, depression, recent laparoscopic cholecystectomy for symptomatic cholelithiasis on 01/30/2018 and discharged home the same day, diabetes mellitus, hypertension, hypothyroidism, obstructive sleep apnea (currently not on CPAP), osteoarthritis of knees status post knee replacement who presented to the ED the next day with right shoulder pain radiating to the neck and having some shortness of breath.  Patient was seen at the ED in [facility] where labs and chest x-ray were fine, pain improved with Dilaudid and discharged home.  The pain persisted and she called her surgeon who instructed her to come to any pain ED. In the ED she is found to have O2 desaturation and underwent CT angiogram of the chest to rule out PE which was negative but showed right middle lobe infiltrate. Patient admitted for post operative pain as well as nausea and vomiting.  Patient then developed a temperature spike with persistent nausea.  Also found to have flat affect by primary team this morning.  Medical consult called for further evaluation.  Patient reports that she still having nausea and off-and-on abdominal pain.  Also reports pain worsened with deep breathing.  Had vomiting this morning and threw up the meals she had 2 days ago.  Passing flatus but has not had bowel movement since yesterday.  She reports that morphine is making her drowsy and her thoughts are not very clear.  Denies any active  depression or suicidal ideations.   Review Of Systems     In addition to the HPI above,  No Fever-chills, No Headache, No changes with Vision or hearing, No problems swallowing food or Liquids, No Chest pain, Cough, shortness of breath  Abdominal pain, No Nausea or Vommitting, no bowel movement since admission No Blood in stool or Urine, No dysuria, No new skin rashes or bruises, No new joints pains-aches,  No new weakness, tingling, numbness in any extremity, No recent weight gain or loss, No polyuria, polydypsia or polyphagia, No significant Mental Stressors.  Feeling low with ongoing nausea and vomiting and clouding   Past medical history NAF LD Depression Hypertension Hyperlipidemia Type 2 diabetes  mellitus Osteoarthritis Obstructive sleep apnea Anxiety  Past surgical history Laparoscopic cholecystectomy on 3/29 Left total knee arthroplasty on 08/2017   Social History   Social History   Tobacco Use  . Smoking status: Never Smoker  . Smokeless tobacco: Never Used  Substance Use Topics  . Alcohol use: No    Family History   Family History  Problem Relation Age of Onset  . Suicidality Father   . Diabetes Brother   . Hypertension Brother   . Heart disease Brother      Medications   Prior to Admission medications   Medication Sig Start Date End Date Taking? Authorizing Provider  acyclovir (ZOVIRAX) 400 MG tablet Take 400 mg by mouth 2 (two) times daily.   Yes [provider]  atorvastatin (LIPITOR) 40 MG tablet Take 20 mg by mouth daily.    Yes [provider]  glipiZIDE (GLUCOTROL) 5 MG tablet Take 5 mg by mouth 2 (two) times daily before a meal.   Yes [provider]  hydrochlorothiazide (HYDRODIURIL) 25 MG tablet Take 25 mg by mouth daily.   Yes [provider]  levothyroxine (SYNTHROID, LEVOTHROID) 100 MCG tablet Take 100 mcg by mouth daily before breakfast.   Yes [provider]  losartan (COZAAR) 50  MG tablet Take 50 mg by mouth daily.    Yes [provider]  metFORMIN (GLUCOPHAGE) 1000 MG tablet Take 1,000 mg by mouth 2 (two) times daily with a meal.   Yes [provider]  pantoprazole (PROTONIX) 40 MG tablet Take 1 tablet (40 mg total) by mouth daily before breakfast. 11/21/17  Yes Rehman, Mechele Dawley, MD  prednisoLONE acetate (PRED FORTE) 1 % ophthalmic suspension Place 1 drop into the left eye as needed (eye ulcer flare).   Yes [provider]  sertraline (ZOLOFT) 100 MG tablet Take 100 mg by mouth 2 (two) times daily.   Yes [provider]  trifluridine (VIROPTIC) 1 % ophthalmic solution Place 1 drop into the left eye as needed (eye ulcer flare).   Yes [provider]  acetaminophen (TYLENOL) 325 MG tablet Take 2 tablets (650 mg total) by mouth every 6 (six) hours as needed for mild pain (or Fever >/= 101). Patient taking differently: Take 650 mg by mouth at bedtime as needed for mild pain (or Fever >/= 101).  08/19/17   Shepperson, Kirstin, PA-C  docusate sodium (COLACE) 100 MG capsule Take 1 capsule (100 mg total) by mouth 2 (two) times daily. 01/30/18 01/30/19  Virl Cagey, MD  oxyCODONE (ROXICODONE) 5 MG immediate release tablet Take 1 tablet (5 mg total) by mouth every 4 (four) hours as needed. 01/30/18 01/30/19  Virl Cagey, MD    Anti-infectives (From admission, onward)   Start     Dose/Rate Route Frequency Ordered Stop   02/01/18 1230  levofloxacin (LEVAQUIN) IVPB 750 mg     750 mg 100 mL/hr over 90 Minutes Intravenous Every 24 hours 02/01/18 1137 02/06/18 1229   01/31/18 1030  acyclovir (ZOVIRAX) tablet 400 mg     400 mg Oral 2 times daily 01/31/18 1017        Scheduled Meds: . acyclovir  400 mg Oral BID  . atorvastatin  20 mg Oral Daily  . docusate sodium  100 mg Oral BID  . heparin  5,000 Units Subcutaneous Q8H  . hydrochlorothiazide  25 mg Oral Daily  . insulin aspart  0-15 Units Subcutaneous TID WC  . insulin  aspart  0-5 Units  Subcutaneous QHS  . levothyroxine  100 mcg Oral QAC breakfast  . losartan  50 mg Oral Daily  . metoCLOPramide (REGLAN) injection  5 mg Intravenous Q6H  . ondansetron (ZOFRAN) IV  4 mg Intravenous Q8H  . pantoprazole  40 mg Oral QAC breakfast  . sertraline  100 mg Oral BID   Continuous Infusions: . 0.45 % NaCl with KCl 20 mEq / L    . levofloxacin (LEVAQUIN) IV     PRN Meds:.acetaminophen, diphenhydrAMINE **OR** diphenhydrAMINE, metoprolol tartrate, morphine injection, oxyCODONE, promethazine, simethicone  Allergies  Allergen Reactions  . Penicillins Hives, Itching and Other (See Comments)    Has taken keflex without difficulty      Has patient had a PCN reaction causing immediate rash, facial/tongue/throat swelling, SOB or lightheadedness with hypotension: Yes Has patient had a PCN reaction causing severe rash involving mucus membranes or skin necrosis: No  Has patient had a PCN reaction that required hospitalization: No Has patient had a PCN reaction occurring within the last 10 years: No If all of the above answers are "NO", then may proceed with Cephalosporin use.   . Sulfa Antibiotics Hives    Objective   Physical Exam  Vitals  Blood pressure (!) 110/56, pulse (!) 101, temperature 99.8 F (37.7 C), temperature source Oral, resp. rate (!) 21, height 5\' 7"  (1.702 m), weight 83.5 kg (184 lb), SpO2 98 %.   General: Elderly female lying in bed, appears fatigued, has flat affect HEENT: No pallor, no icterus, dry mucosa, supple neck Chest: Diminished bibasilar breath sounds, no crackles or rhonchi CVS: Normal S1 and S2, no murmur rub or gallop GI: Soft, laparoscopic site appears clean, nondistended, minimal tenderness, bowel sounds present Musculoskeletal: Warm, no edema CNS: Alert and oriented, flat effect   Data   CBC Recent Labs  Lab 01/27/18 0759 01/31/18 0646 01/31/18 1044 02/01/18 0509  WBC 3.8* 7.9 5.7 4.4  HGB 12.8 12.0 10.4* 10.1*  HCT  39.8 37.6 32.9* 32.1*  PLT 126* 141* 102* 89*  MCV 74.3* 75.2* 76.2* 76.4*  MCH 23.9* 24.0* 24.1* 24.0*  MCHC 32.2 31.9 31.6 31.5  RDW 15.3 15.7* 15.8* 16.0*  LYMPHSABS 0.4* 0.5* 0.3* 0.5*  MONOABS 0.2 0.5 0.3 0.3  EOSABS 0.0 0.0 0.0 0.0  BASOSABS 0.0 0.0 0.0 0.0   ------------------------------------------------------------------------------------------------------------------  Chemistries  Recent Labs  Lab 01/27/18 0759 01/31/18 0646  NA 138 137  K 4.0 3.8  CL 97* 96*  CO2 28 25  GLUCOSE 218* 297*  BUN 17 25*  CREATININE 0.79 0.96  CALCIUM 9.7 8.9  AST 33 63*  ALT 28 61*  ALKPHOS 105 93  BILITOT 0.9 1.0   ------------------------------------------------------------------------------------------------------------------ estimated creatinine clearance is 61.5 mL/min (by C-G formula based on SCr of 0.96 mg/dL). ------------------------------------------------------------------------------------------------------------------ No results for input(s): TSH, T4TOTAL, T3FREE, THYROIDAB in the last 72 hours.  Invalid input(s): FREET3   Coagulation profile Recent Labs  Lab 01/27/18 0759 01/31/18 0646  INR 1.11 1.17   ------------------------------------------------------------------------------------------------------------------- No results for input(s): DDIMER in the last 72 hours. -------------------------------------------------------------------------------------------------------------------  Cardiac Enzymes Recent Labs  Lab 01/31/18 0646  TROPONINI <0.03   ------------------------------------------------------------------------------------------------------------------ Invalid input(s): POCBNP   ---------------------------------------------------------------------------------------------------------------  Urinalysis    Component Value Date/Time   COLORURINE COLORLESS (A) 08/20/2017 2041   APPEARANCEUR CLEAR 08/20/2017 2041   LABSPEC 1.002 (L)  08/20/2017 2041   PHURINE 6.0 08/20/2017 2041   GLUCOSEU NEGATIVE 08/20/2017 2041   HGBUR NEGATIVE 08/20/2017 2041   [place] NEGATIVE 08/20/2017 2041   KETONESUR NEGATIVE  08/20/2017 2041   PROTEINUR NEGATIVE 08/20/2017 2041   NITRITE NEGATIVE 08/20/2017 2041   LEUKOCYTESUR NEGATIVE 08/20/2017 2041     Imaging    Dg Chest 2 View  Result Date: 02/01/2018 CLINICAL DATA:  Nausea, pale, week EXAM: CHEST - 2 VIEW COMPARISON:  CT chest 01/31/2018 FINDINGS: Right lower lobe airspace disease which may reflect atelectasis versus pneumonia. Mild left basilar airspace disease likely reflecting atelectasis. Chronic elevation of the right diaphragm. No pneumothorax. Stable cardiomediastinal silhouette. No aggressive osseous lesion. IMPRESSION: 1. Right lower lobe airspace disease which may reflect atelectasis versus pneumonia. Mild left basilar airspace disease likely reflecting atelectasis. Electronically Signed   By: Kathreen Devoid   On: 02/01/2018 10:35   Ct Angio Chest Pe W And/or Wo Contrast  Result Date: 01/31/2018 CLINICAL DATA:  Status post cholecystectomy 1 day ago. Right shoulder pain after surgery. Difficulty breathing. EXAM: CT ANGIOGRAPHY CHEST WITH CONTRAST TECHNIQUE: Multidetector CT imaging of the chest was performed using the standard protocol during bolus administration of intravenous contrast. Multiplanar CT image reconstructions and MIPs were obtained to evaluate the vascular anatomy. CONTRAST:  1108mL ISOVUE-370 IOPAMIDOL (ISOVUE-370) INJECTION 76% COMPARISON:  Chest x-ray January 31, 2018 and chest CT August 22, 2017 FINDINGS: Cardiovascular: Atherosclerotic changes seen in the nonaneurysmal thoracic aorta. No aortic dissection. The heart is unremarkable. Evaluation of the pulmonary arteries is limited due to respiratory motion beyond the segmental branch level. No pulmonary emboli are identified. Mediastinum/Nodes: There is a calcified nodule in the right thyroid lobe. There is some  fluid in the distal esophagus suggesting reflux. The esophagus is otherwise normal. No adenopathy. There is a tiny right pleural effusion. No left-sided pleural effusion or pericardial effusion. Lungs/Pleura: Central airways are normal. No pneumothorax. There is opacity in the medial right lung base with air bronchograms. While there may be a component of atelectasis, the findings are suspicious for infiltrate as well in this region suggesting pneumonia or aspiration. Mild interlobular septal thickening in the apices suggest pulmonary venous congestion/mild edema. Atelectasis seen in the left lung base. No nodule or mass. Upper Abdomen: Air and fluid in the gallbladder fossa is identified. Cholecystectomy clips are noted. A small amount of pericholecystic fluid and air identified more anteriorly as well. Free intraperitoneal air consistent with recent surgery. Splenomegaly identified and similar in the interval. No other acute abnormalities in the upper abdomen. Musculoskeletal: No chest wall abnormality. No acute or significant osseous findings. Review of the MIP images confirms the above findings. IMPRESSION: 1. No pulmonary emboli identified. 2. The opacity in the medial right lung base is suspicious for infiltrate. There is likely a component of atelectasis as well. Recommend follow-up to resolution. 3. Mild pulmonary venous congestion/edema. 4. Fluid and air in the gallbladder fossa with a small amount of fluid and air adjacent to the liver. These findings could all be postoperative given cholecystectomy yesterday. Similar findings could be seen in the setting of an abscess or bile leak. An abscess is considered less likely given only 1 day since surgery. Recommend clinical correlation. 5. Atherosclerotic changes in the nonaneurysmal aorta. Aortic Atherosclerosis (ICD10-I70.0). Electronically Signed   By: Dorise Bullion III M.D   On: 01/31/2018 09:04   Dg Chest Port 1 View  Result Date: 01/31/2018 CLINICAL  DATA:  Shortness of breath. EXAM: PORTABLE CHEST 1 VIEW COMPARISON:  01/30/2018 FINDINGS: Stable appearance of the cardiomediastinal contours. There are low lung volumes with asymmetric elevation of right hemidiaphragm. Platelike atelectasis noted in both lung bases. IMPRESSION: 1. Persistent  low lung volumes with asymmetric elevation of the right hemidiaphragm. 2. Bibasilar atelectasis. Electronically Signed   By: Kerby Moors M.D.   On: 01/31/2018 07:40      Assessment & Paln   Principal problem Right lobar pneumonia ([place]) Likely contributing to fever.  Started on empiric Levaquin by primary team today.  (Has not received dose due to poor IV access).  Supportive care with Tylenol.  Continue incentive spirometry for atelectasis.  Active problems Postoperative abdominal and shoulder pain Management per primary team.  Patient reports being more nauseous and drowsy with morphine.  After discussing with primary team I have switched her to low-dose fentanyl.  (Avoiding NSAIDs due to low platelets).  Continue oxycodone as needed.  Nausea and vomiting Likely associated with gastroparesis, further contributed by morphine and underlying pneumonia. On scheduled Reglan and as needed Phenergan. Switch diet to clears.  Diabetes mellitus type 2 Hold metformin.  Monitor on sliding scale coverage.  A1c of 6.5  Essential hypertension Stable.  Continue home medications  Chronic depression Patient denies any worsening of her symptoms although feels low with her acute illness.  Continue Zoloft.  Check EKG to monitor QTC while on both Zoloft and scheduled Reglan.  Thrombocytopenia Has chronic mild thrombocytopenia likely due to cirrhosis.  Acute drop in platelets likely due to current illness.HIT unlikely.  Monitor closely.  Hypothyroidism On Synthroid.      DVT Prophylaxis: Subcu heparin  AM Labs Ordered, also please review Full Orders  Family Communication: Plan discussed with patient and  her husband at bedside.  Recommendations discussed with Dr. Constance Haw.  Dr. Constance Haw. Thank you for the consult, we will follow the patient with you in the Hospital.   Flonnie Overman Colburn Asper M.D on 02/01/2018 at 1:11 PM  Between 7am to 7pm - Pager - [phone]. After 7pm go to [email or web address] - password [email or web address]  [facility]  - Office  [phone]   Thank you for the consult, we will follow the patient with you in the Hospital.

## 2018-02-01 NOTE — Progress Notes (Signed)
[facility]  Discussed patient lack of cooperating with the husband. Have decreased her morphine which is controlling her pain but making her tired. Patient in room and appears to be pretending to sleep. IS again only pulling like 250 and not really trying.  I have added Reglan and zofran to help with her nausea. Discussed that I have a low suspicion for PNA but all things consider, I am just starting Abx bc patient is not helping with her low lung volumes and inability to do the IS. Will order flutter too for her to do.   Will get hospitalitist involved to make sure they assess patient and do not find any other concerning issues.   Curlene Labrum, MD [facility] [address] [place], [place] [address] [phone] (office)

## 2018-02-01 NOTE — Progress Notes (Addendum)
Pt vomited. The emesis consisted of pinto beans which is what the pt reported she ate on Friday night. Gave Zofran, Morphine, and Novolog but held other medications because pt was vomiting.

## 2018-02-02 ENCOUNTER — Encounter (HOSPITAL_COMMUNITY): Payer: Self-pay | Admitting: General Surgery

## 2018-02-02 DIAGNOSIS — D696 Thrombocytopenia, unspecified: Secondary | ICD-10-CM

## 2018-02-02 LAB — GLUCOSE, CAPILLARY
GLUCOSE-CAPILLARY: 252 mg/dL — AB (ref 65–99)
GLUCOSE-CAPILLARY: 277 mg/dL — AB (ref 65–99)

## 2018-02-02 LAB — CBC WITH DIFFERENTIAL/PLATELET
Basophils Absolute: 0 10*3/uL (ref 0.0–0.1)
Basophils Relative: 0 %
EOS PCT: 0 %
Eosinophils Absolute: 0 10*3/uL (ref 0.0–0.7)
HEMATOCRIT: 29.8 % — AB (ref 36.0–46.0)
HEMOGLOBIN: 9.4 g/dL — AB (ref 12.0–15.0)
LYMPHS ABS: 0.2 10*3/uL — AB (ref 0.7–4.0)
LYMPHS PCT: 8 %
MCH: 24 pg — AB (ref 26.0–34.0)
MCHC: 31.5 g/dL (ref 30.0–36.0)
MCV: 76.2 fL — AB (ref 78.0–100.0)
Monocytes Absolute: 0.2 10*3/uL (ref 0.1–1.0)
Monocytes Relative: 7 %
NEUTROS ABS: 2.7 10*3/uL (ref 1.7–7.7)
NEUTROS PCT: 85 %
Platelets: 83 10*3/uL — ABNORMAL LOW (ref 150–400)
RBC: 3.91 MIL/uL (ref 3.87–5.11)
RDW: 15.5 % (ref 11.5–15.5)
WBC: 3.1 10*3/uL — AB (ref 4.0–10.5)

## 2018-02-02 LAB — COMPREHENSIVE METABOLIC PANEL
ALK PHOS: 79 U/L (ref 38–126)
ALT: 43 U/L (ref 14–54)
AST: 33 U/L (ref 15–41)
Albumin: 3.3 g/dL — ABNORMAL LOW (ref 3.5–5.0)
Anion gap: 10 (ref 5–15)
BILIRUBIN TOTAL: 1 mg/dL (ref 0.3–1.2)
BUN: 17 mg/dL (ref 6–20)
CALCIUM: 9 mg/dL (ref 8.9–10.3)
CO2: 28 mmol/L (ref 22–32)
CREATININE: 0.71 mg/dL (ref 0.44–1.00)
Chloride: 96 mmol/L — ABNORMAL LOW (ref 101–111)
Glucose, Bld: 256 mg/dL — ABNORMAL HIGH (ref 65–99)
Potassium: 3.9 mmol/L (ref 3.5–5.1)
Sodium: 134 mmol/L — ABNORMAL LOW (ref 135–145)
Total Protein: 6.6 g/dL (ref 6.5–8.1)

## 2018-02-02 MED ORDER — LEVOFLOXACIN 750 MG PO TABS: 750.0000 mg | ORAL_TABLET | Freq: Every day | ORAL | 0 refills | Status: AC

## 2018-02-02 MED ORDER — ONDANSETRON HCL 4 MG PO TABS: 4.0000 mg | ORAL_TABLET | Freq: Three times a day (TID) | ORAL | 1 refills | Status: DC | PRN

## 2018-02-02 MED ORDER — METOCLOPRAMIDE HCL 5 MG PO TABS: 5.0000 mg | ORAL_TABLET | Freq: Three times a day (TID) | ORAL | 0 refills | Status: DC

## 2018-02-02 NOTE — Progress Notes (Signed)
Inpatient Diabetes Program Recommendations  AACE/ADA: New Consensus Statement on Inpatient Glycemic Control (2015)  Target Ranges:  Prepandial:   less than 140 mg/dL      Peak postprandial:   less than 180 mg/dL (1-2 hours)      Critically ill patients:  140 - 180 mg/dL   Results for KINNEDY, MONGIELLO (MRN [id number]) as of 02/02/2018 07:44  Ref. Range 02/02/2018 06:37  Glucose Latest Ref Range: 65 - 99 mg/dL 256 (H)   Results for ZIAIRE, BIESER (MRN [id number]) as of 02/02/2018 07:44  Ref. Range 02/01/2018 08:26 02/01/2018 11:23 02/01/2018 16:49 02/01/2018 21:10  Glucose-Capillary Latest Ref Range: 65 - 99 mg/dL 224 (H) 256 (H) 204 (H) 222 (H)   Results for MARICELLA, FILYAW (MRN [id number]) as of 02/02/2018 07:44  Ref. Range 01/27/2018 08:07  Hemoglobin A1C Latest Ref Range: 4.8 - 5.6 % 6.5 (H)   Review of Glycemic Control  Diabetes history: DM2 Outpatient Diabetes medications: Glipizide 5 mg BID, Metformin 1000 mg BID Current orders for Inpatient glycemic control: Novolog 0-15 units TID with meals, Novolog 0-5 units QHS  Inpatient Diabetes Program Recommendations: Insulin - Basal: While inpatient, please consider ordering Lantus 8 units Q24H starting now (based on 83 kg x 0.1 units).  Thanks, Barnie Alderman, RN, MSN, CDE Diabetes Coordinator Inpatient Diabetes Program [phone] (Team Pager from 8am to 5pm)

## 2018-02-02 NOTE — Progress Notes (Signed)
PROGRESS NOTE                                                                                                                                                                                                             Patient Demographics:    Katherine Bell, is a 67 y.o. female, DOB - 1951-03-09, OFB:[id number]  Admit date - 01/31/2018   Admitting Physician Virl Cagey, MD  Outpatient Primary MD for the patient is Arsenio Katz, NP  LOS - 1  Outpatient Specialists:  Chief Complaint  Patient presents with  . Back Pain    Upper right side shoulder pain 1 day post op gall bladder removal        Brief Narrative   67 year old female with NAFLD, diabetes mellitus, depression, recent laproscopic cholecystectomy who returns for post operative pain and shortness of breath and was managed for pain control and possible pneumonia.  Hospitalist consulted for shortness of breath, drop in platelets and depressive symptoms.   Subjective:   Patient is in a good mood this morning.  Reports no further vomiting and nausea which better.  Pain is also better controlled.  Denies having drowsiness or irritability after morphine was stopped.   Assessment  & Plan :   Right lobar pneumonia Continue Levaquin, recommend total 5 days of treatment.  (Until 4/3)  Postoperative pain Improved with change in medications.  (Currently on oxycodone and low-dose fentanyl).  Management per primary team.  Nausea and vomiting Possibly associated with gastroparesis and?  Likely also contributed by morphine.  Now improved.  Chronic depression Symptoms worsened by pain, nausea and vomiting.  In better mood today.  Continue Zoloft.  Thrombocytopenia Drop in platelets possibly due to acute illness, has chronic  mild low platelets due to liver cirrhosis.  No signs of HI T.  Remaining plan per primary team.  We will sign off.  Thank you for the  consult.  Please call for any questions  Lab Results  Component Value Date   PLT 83 (L) 02/02/2018    Antibiotics  :    Anti-infectives (From admission, onward)   Start     Dose/Rate Route Frequency Ordered Stop   02/01/18 1230  levofloxacin (LEVAQUIN) IVPB 750 mg     750 mg 100 mL/hr over 90 Minutes Intravenous Every 24 hours 02/01/18 1137 02/06/18 1229  01/31/18 1030  acyclovir (ZOVIRAX) tablet 400 mg     400 mg Oral 2 times daily 01/31/18 1017          Objective:   Vitals:   02/01/18 1500 02/01/18 1900 02/01/18 2300 02/02/18 0600  BP: 121/65 133/67 125/64 107/62  Pulse: 97 99 92 90  Resp: 19 20 20 19   Temp: 98.9 F (37.2 C) 98.5 F (36.9 C) 98.8 F (37.1 C) 99 F (37.2 C)  TempSrc: Oral Oral Oral Oral  SpO2: 95% 97% 94% 98%  Weight:      Height:        Wt Readings from Last 3 Encounters:  01/31/18 83.5 kg (184 lb)  01/30/18 83.5 kg (184 lb)  01/27/18 83.1 kg (183 lb 3.2 oz)     Intake/Output Summary (Last 24 hours) at 02/02/2018 0914 Last data filed at 02/02/2018 0300 Gross per 24 hour  Intake 1230.83 ml  Output 350 ml  Net 880.83 ml     Physical Exam  Gen: not in distress, in good mood HEENT: no pallor, moist mucosa, supple neck Chest: clear b/l, no added sounds CVS: N S1&S2, no murmurs, GI: soft, NT, ND, BS+ Musculoskeletal: warm, no edema     Data Review:    CBC Recent Labs  Lab 01/27/18 0759 01/31/18 0646 01/31/18 1044 02/01/18 0509 02/02/18 0637  WBC 3.8* 7.9 5.7 4.4 3.1*  HGB 12.8 12.0 10.4* 10.1* 9.4*  HCT 39.8 37.6 32.9* 32.1* 29.8*  PLT 126* 141* 102* 89* 83*  MCV 74.3* 75.2* 76.2* 76.4* 76.2*  MCH 23.9* 24.0* 24.1* 24.0* 24.0*  MCHC 32.2 31.9 31.6 31.5 31.5  RDW 15.3 15.7* 15.8* 16.0* 15.5  LYMPHSABS 0.4* 0.5* 0.3* 0.5* 0.2*  MONOABS 0.2 0.5 0.3 0.3 0.2  EOSABS 0.0 0.0 0.0 0.0 0.0  BASOSABS 0.0 0.0 0.0 0.0 0.0    Chemistries  Recent Labs  Lab 01/27/18 0759 01/31/18 0646 02/02/18 0637  NA 138 137 134*  K 4.0 3.8  3.9  CL 97* 96* 96*  CO2 28 25 28   GLUCOSE 218* 297* 256*  BUN 17 25* 17  CREATININE 0.79 0.96 0.71  CALCIUM 9.7 8.9 9.0  AST 33 63* 33  ALT 28 61* 43  ALKPHOS 105 93 79  BILITOT 0.9 1.0 1.0   ------------------------------------------------------------------------------------------------------------------ No results for input(s): CHOL, HDL, LDLCALC, TRIG, CHOLHDL, LDLDIRECT in the last 72 hours.  Lab Results  Component Value Date   HGBA1C 6.5 (H) 01/27/2018   ------------------------------------------------------------------------------------------------------------------ No results for input(s): TSH, T4TOTAL, T3FREE, THYROIDAB in the last 72 hours.  Invalid input(s): FREET3 ------------------------------------------------------------------------------------------------------------------ No results for input(s): VITAMINB12, FOLATE, FERRITIN, TIBC, IRON, RETICCTPCT in the last 72 hours.  Coagulation profile Recent Labs  Lab 01/27/18 0759 01/31/18 0646  INR 1.11 1.17    No results for input(s): DDIMER in the last 72 hours.  Cardiac Enzymes Recent Labs  Lab 01/31/18 0646  TROPONINI <0.03   ------------------------------------------------------------------------------------------------------------------ No results found for: BNP  Inpatient Medications  Scheduled Meds: . acyclovir  400 mg Oral BID  . atorvastatin  20 mg Oral Daily  . docusate sodium  100 mg Oral BID  . heparin  5,000 Units Subcutaneous Q8H  . hydrochlorothiazide  25 mg Oral Daily  . insulin aspart  0-15 Units Subcutaneous TID WC  . insulin aspart  0-5 Units Subcutaneous QHS  . levothyroxine  100 mcg Oral QAC breakfast  . losartan  50 mg Oral Daily  . metoCLOPramide (REGLAN) injection  5 mg Intravenous Q6H  .  ondansetron (ZOFRAN) IV  4 mg Intravenous Q8H  . pantoprazole  40 mg Oral QAC breakfast  . sertraline  100 mg Oral BID   Continuous Infusions: . 0.45 % NaCl with KCl 20 mEq / L 50 mL/hr  at 02/01/18 1459  . levofloxacin (LEVAQUIN) IV 750 mg (02/01/18 1459)   PRN Meds:.acetaminophen, diphenhydrAMINE **OR** diphenhydrAMINE, fentaNYL (SUBLIMAZE) injection, metoprolol tartrate, oxyCODONE, promethazine, simethicone  Micro Results No results found for this or any previous visit (from the past 240 hour(s)).  Radiology Reports Dg Chest 2 View  Result Date: 02/01/2018 CLINICAL DATA:  Nausea, pale, week EXAM: CHEST - 2 VIEW COMPARISON:  CT chest 01/31/2018 FINDINGS: Right lower lobe airspace disease which may reflect atelectasis versus pneumonia. Mild left basilar airspace disease likely reflecting atelectasis. Chronic elevation of the right diaphragm. No pneumothorax. Stable cardiomediastinal silhouette. No aggressive osseous lesion. IMPRESSION: 1. Right lower lobe airspace disease which may reflect atelectasis versus pneumonia. Mild left basilar airspace disease likely reflecting atelectasis. Electronically Signed   By: Kathreen Devoid   On: 02/01/2018 10:35   Ct Angio Chest Pe W And/or Wo Contrast  Result Date: 01/31/2018 CLINICAL DATA:  Status post cholecystectomy 1 day ago. Right shoulder pain after surgery. Difficulty breathing. EXAM: CT ANGIOGRAPHY CHEST WITH CONTRAST TECHNIQUE: Multidetector CT imaging of the chest was performed using the standard protocol during bolus administration of intravenous contrast. Multiplanar CT image reconstructions and MIPs were obtained to evaluate the vascular anatomy. CONTRAST:  125mL ISOVUE-370 IOPAMIDOL (ISOVUE-370) INJECTION 76% COMPARISON:  Chest x-ray January 31, 2018 and chest CT August 22, 2017 FINDINGS: Cardiovascular: Atherosclerotic changes seen in the nonaneurysmal thoracic aorta. No aortic dissection. The heart is unremarkable. Evaluation of the pulmonary arteries is limited due to respiratory motion beyond the segmental branch level. No pulmonary emboli are identified. Mediastinum/Nodes: There is a calcified nodule in the right thyroid lobe.  There is some fluid in the distal esophagus suggesting reflux. The esophagus is otherwise normal. No adenopathy. There is a tiny right pleural effusion. No left-sided pleural effusion or pericardial effusion. Lungs/Pleura: Central airways are normal. No pneumothorax. There is opacity in the medial right lung base with air bronchograms. While there may be a component of atelectasis, the findings are suspicious for infiltrate as well in this region suggesting pneumonia or aspiration. Mild interlobular septal thickening in the apices suggest pulmonary venous congestion/mild edema. Atelectasis seen in the left lung base. No nodule or mass. Upper Abdomen: Air and fluid in the gallbladder fossa is identified. Cholecystectomy clips are noted. A small amount of pericholecystic fluid and air identified more anteriorly as well. Free intraperitoneal air consistent with recent surgery. Splenomegaly identified and similar in the interval. No other acute abnormalities in the upper abdomen. Musculoskeletal: No chest wall abnormality. No acute or significant osseous findings. Review of the MIP images confirms the above findings. IMPRESSION: 1. No pulmonary emboli identified. 2. The opacity in the medial right lung base is suspicious for infiltrate. There is likely a component of atelectasis as well. Recommend follow-up to resolution. 3. Mild pulmonary venous congestion/edema. 4. Fluid and air in the gallbladder fossa with a small amount of fluid and air adjacent to the liver. These findings could all be postoperative given cholecystectomy yesterday. Similar findings could be seen in the setting of an abscess or bile leak. An abscess is considered less likely given only 1 day since surgery. Recommend clinical correlation. 5. Atherosclerotic changes in the nonaneurysmal aorta. Aortic Atherosclerosis (ICD10-I70.0). Electronically Signed   By: Dorise Bullion  III M.D   On: 01/31/2018 09:04   Dg Chest Port 1 View  Result Date:  01/31/2018 CLINICAL DATA:  Shortness of breath. EXAM: PORTABLE CHEST 1 VIEW COMPARISON:  01/30/2018 FINDINGS: Stable appearance of the cardiomediastinal contours. There are low lung volumes with asymmetric elevation of right hemidiaphragm. Platelike atelectasis noted in both lung bases. IMPRESSION: 1. Persistent low lung volumes with asymmetric elevation of the right hemidiaphragm. 2. Bibasilar atelectasis. Electronically Signed   By: Kerby Moors M.D.   On: 01/31/2018 07:40    Time Spent in minutes  25   Juel Bellerose M.D on 02/02/2018 at 9:14 AM  Between 7am to 7pm - Pager - [phone]  After 7pm go to [email or web address] - password [email or web address]  [facility] -  Office  [phone]

## 2018-02-02 NOTE — Care Management Important Message (Signed)
Important Message  Patient Details  Name: Katherine Bell MRN: [id number] Date of Birth: 1950/12/18   Medicare Important Message Given:  Yes    [name] 02/02/2018, 12:03 PM

## 2018-02-02 NOTE — Progress Notes (Signed)
Patient and family state understanding of discharge instructions 

## 2018-02-02 NOTE — Discharge Instructions (Signed)
Discharge Instructions: Shower per your regular routine. Take tylenol and ibuprofen as needed for pain control, alternating every 4-6 hours.  Take Roxicodone for breakthrough pain. Take colace for constipation related to narcotic pain medication. Take miralax or milk of magnesia if no BM in the next day.  Do not pick at the dermabond glue on your incision sites.  Take the levaquin for pneumonia.  Take your reglan to help you stomach empty for at least the next week. Take three times a day as prescribed, once wanting to come off, take twice a day for two days, once a day for two days, and then stomach.  Take zofran as needed for refractory nausea/vomiting.  Do your Incentive Spirometry at home every hour while awake.  Get up and move as much as possible.     Atelectasis, Adult Atelectasis is a collapse of air sacs in the lungs (alveoli). The condition causes all or part of a lung to collapse. Atelectasis is a common problem after surgery. Its severity depends on the size of lung tissue area involved and the underlying cause. When severe, it can lead to shortness of breath and heart problems. Atelectasis can develop suddenly or over a long period of time. Atelectasis that develops over a long period of time (chronic atelectasis) often leads to infection, scarring, and other problems. What are the causes? This condition may be caused by:  Shallow breathing.  Medicines that make breathing more shallow.  A blockage in an airway. Blockages can result from: ? A buildup of mucus. ? A tumor. ? An inhaled object (foreign body). ? Enlarged lymph nodes. ? Fluid in the lungs (pleural effusion). ? A blood clot in the lungs.  Outside pressure on the lung. Pressure can be due to: ? A tumor. ? Fluid in the lungs (pleural effusion). ? Air leaking between the lung and rib cage (pneumothorax). ? Enlarged lymph nodes.  Improper expansion of the lungs. This may occur in newborns because  of: ? Prematurity. ? Low oxygen levels. ? Secretions at birth that block the airway. ? Amniotic fluid that goes into the lungs (aspiration).  What increases the risk? This condition is more likely to develop in people who:  Have an injury or health problem that makes taking deep breaths difficult or painful.  Have certain infections or diseases, such as pneumonia or cystic fibrosis.  Have had surgery on the chest or abdomen.  Have broken ribs.  Have a tight bandage around their chest.  Have a collapsed lung due to pneumothorax.  Take medicines that decrease the rate of their breathing or how deeply they breathe, like sedatives.  Lie flat for long periods of time.  What are the signs or symptoms? Often, there are no symptoms for this condition. When symptoms do appear, they may include:  Shortness of breath.  Bluish color to the nails, lips, or mouth (cyanosis).  A cough.  How is this diagnosed? This condition may be diagnosed based on:  Symptoms.  A physical exam.  A chest X-ray.  Sometimes specialized imaging tests are needed to diagnose the condition. How is this treated? Treatment for this condition depends on what caused the condition. Treatment may involve:  Coughing. Coughing helps loosen mucus in the airway.  Chest physiotherapy. This is a treatment to help loosen and clear mucus from the airways. It is done by clapping the chest.  Postural drainage techniques. This treatment involves positioning your body so your head is lower than your chest. It helps  mucus drain from your airways.  An incentive spirometer. This is a device that is used to help with taking deeper breaths.  Positive pressure breathing. This is a form of breathing assistance in which air is forced into the lungs when you breathe in (inhale). You may have this treatment if your condition is severe.  Treatment of the underlying condition.  Follow these instructions at home:  Take  over-the-counter and prescription medicines only as told by your health care provider.  Practice taking relaxed and deep breaths when you are sitting. A good time to practice is when you are watching TV. Take a few deep breaths during each commercial break.  Make sure to lie on your unaffected side when you are lying down. For example, if you have atelectasis in your left lung, lie on your right side. This will help mucus drain from your airway.  Cough several times a day as told by your health care provider.  Perform chest physiotherapy or postural drainage techniques as told by your health care provider. If necessary, have someone help you.  If you were given a device to help with breathing, use it as told by your health care provider.  Stay as active as possible. Get help right away if:  Your breathing problems get worse.  You have severe chest pain.  You develop severe coughing.  You cough up blood.  You have a fever.  You have persistent symptoms for more than 2-3 days.  Your symptoms suddenly get worse. This information is not intended to replace advice given to you by your health care provider. Make sure you discuss any questions you have with your health care provider. Document Released: 10/21/2005 Document Revised: 05/10/2016 Document Reviewed: 03/25/2016 Elsevier Interactive Patient Education  2018 [employer]  Community-Acquired Pneumonia, Adult Pneumonia is an infection of the lungs. One type of pneumonia can happen while a person is in a hospital. A different type can happen when a person is not in a hospital (community-acquired pneumonia). It is easy for this kind to spread from person to person. It can spread to you if you breathe near an infected person who coughs or sneezes. Some symptoms include:  A dry cough.  A wet (productive) cough.  Fever.  Sweating.  Chest pain.  Follow these instructions at home:  Take over-the-counter and prescription  medicines only as told by your doctor. ? Only take cough medicine if you are losing sleep. ? If you were prescribed an antibiotic medicine, take it as told by your doctor. Do not stop taking the antibiotic even if you start to feel better.  Sleep with your head and neck raised (elevated). You can do this by putting a few pillows under your head, or you can sleep in a recliner.  Do not use tobacco products. These include cigarettes, chewing tobacco, and e-cigarettes. If you need help quitting, ask your doctor.  Drink enough water to keep your pee (urine) clear or pale yellow. A shot (vaccine) can help prevent pneumonia. Shots are often suggested for:  People older than 69 years of age.  People older than 69 years of age: ? Who are having cancer treatment. ? Who have long-term (chronic) lung disease. ? Who have problems with their body's defense system (immune system).  You may also prevent pneumonia if you take these actions:  Get the flu (influenza) shot every year.  Go to the dentist as often as told.  Wash your hands often. If soap and water are  not available, use hand sanitizer.  Contact a doctor if:  You have a fever.  You lose sleep because your cough medicine does not help. Get help right away if:  You are short of breath and it gets worse.  You have more chest pain.  Your sickness gets worse. This is very serious if: ? You are an older adult. ? Your body's defense system is weak.  You cough up blood. This information is not intended to replace advice given to you by your health care provider. Make sure you discuss any questions you have with your health care provider. Document Released: 04/08/2008 Document Revised: 03/28/2016 Document Reviewed: 02/15/2015 Elsevier Interactive Patient Education  2018 [employer].  Laparoscopic Cholecystectomy, Care After This sheet gives you information about how to care for yourself after your procedure. Your doctor may also  give you more specific instructions. If you have problems or questions, contact your doctor. Follow these instructions at home: Care for cuts from surgery (incisions)   Follow instructions from your doctor about how to take care of your cuts from surgery. Make sure you: ? Wash your hands with soap and water before you change your bandage (dressing). If you cannot use soap and water, use hand sanitizer. ? Change your bandage as told by your doctor. ? Leave stitches (sutures), skin glue, or skin tape (adhesive) strips in place. They may need to stay in place for 2 weeks or longer. If tape strips get loose and curl up, you may trim the loose edges. Do not remove tape strips completely unless your doctor says it is okay.  Do not take baths, swim, or use a hot tub until your doctor says it is okay. Ask your doctor if you can take showers. You may only be allowed to take sponge baths for bathing.  Check your surgical cut area every day for signs of infection. Check for: ? More redness, swelling, or pain. ? More fluid or blood. ? Warmth. ? Pus or a bad smell. Activity  Do not drive or use heavy machinery while taking prescription pain medicine.  Do not lift anything that is heavier than 10 lb (4.5 kg) until your doctor says it is okay.  Do not play contact sports until your doctor says it is okay.  Do not drive for 24 hours if you were given a medicine to help you relax (sedative).  Rest as needed. Do not return to work or school until your doctor says it is okay. General instructions  Take over-the-counter and prescription medicines only as told by your doctor.  To prevent or treat constipation while you are taking prescription pain medicine, your doctor may recommend that you: ? Drink enough fluid to keep your pee (urine) clear or pale yellow. ? Take over-the-counter or prescription medicines. ? Eat foods that are high in fiber, such as fresh fruits and vegetables, whole grains, and  beans. ? Limit foods that are high in fat and processed sugars, such as fried and sweet foods. Contact a doctor if:  You develop a rash.  You have more redness, swelling, or pain around your surgical cuts.  You have more fluid or blood coming from your surgical cuts.  Your surgical cuts feel warm to the touch.  You have pus or a bad smell coming from your surgical cuts.  You have a fever.  One or more of your surgical cuts breaks open. Get help right away if:  You have trouble breathing.  You have chest  pain.  You have pain that is getting worse in your shoulders.  You faint or feel dizzy when you stand.  You have very bad pain in your belly (abdomen).  You are sick to your stomach (nauseous) for more than one day.  You have throwing up (vomiting) that lasts for more than one day.  You have leg pain. This information is not intended to replace advice given to you by your health care provider. Make sure you discuss any questions you have with your health care provider. Document Released: 07/30/2008 Document Revised: 05/11/2016 Document Reviewed: 04/08/2016 Elsevier Interactive Patient Education  2018 [employer].

## 2018-02-02 NOTE — Discharge Summary (Signed)
Physician Discharge Summary  Patient ID: Katherine Bell MRN: [id number] DOB/AGE: 1950/11/24 67 y.o.  Admit date: 01/31/2018 Discharge date: 02/02/2018  Admission Diagnoses:  Discharge Diagnoses:  Active Problems:   Post-operative pain   Post-op pain   Discharged Condition: good  Hospital Course: Katherine Bell is a 60 you with multiple medical issue who was sent to me for chronic nausea/vomiting and findings of fatty liver and gallstones. She had multiple other reasons for her nausea/vomiting but ultimately underwent a gastric emptying study which was normal . She came back to me and understood that the laparoscopic cholecystectomy might not fix the issues, but given her continued symptoms, we opted to proceed. She underwent an uneventful laparoscopic cholecystectomy and liver biopsy 01/30/18. She as discharged home,a n then that night had severe right shoulder pain and was seen in the ED at [facility], CXR and labs were done which were relatively unremarkable, and the ED physician Dr. Aggie Moats called to speak with me. The patient later on that night continued ot have pain despite increasing her pain meds to Roxicodone 2 tablets, and I referred her to [facility] ED.  By the time she arrived at our ED, she was also having desaturations. Given the desaturations, atelectasias/ infiltrate starting on her CXR and concern for possible PE, a CTA was done that was negative for PE. It also showed the gallbladder fossa and liver, all of which were normal. She was brought in for pain control, and monitoring. She had some issues with nausea, and vomited up food from 2  Days prior which was again more consistent with gastroparesis despite the gastric emptying study. Some of this could be from the anesthesia and surgery, so she was put on temporary Reglan to help with emptying.  She began to eat and has been tolerating diet.  The patient continued to have poor respiratory effort and developed a flat affect while in  the hospital for the first day. I ultimately decided to treat her for community acquired PNA given fevers and the CXR finding, and the fact that she had some neutrophil shift with normal WBC.  I did not want to miss a PNA and make things worse. The hospitalist were consulted to make sure we were not missing anything, and they agreed.    Ultimately prior to going home, she was tolerating a diet, ambulating, and had received 2 days of levaquin for the 5 day course total.   Consults: hopsitalist- Agreed with treatment for PNA  Significant Diagnostic Studies: CTA- no PE, right lower lobe with atelectasis/ versus infiltrate, repeat CXR with same   Treatments: IV hydration, antibiotics: Levaquin and Pulmonary Toilet   Discharge Exam: Blood pressure 107/62, pulse 90, temperature 99 F (37.2 C), temperature source Oral, resp. rate 19, height 5\' 7"  (1.702 m), weight 184 lb (83.5 kg), SpO2 98 %. General appearance: alert, cooperative and no distress Resp: normal work breathing GI: soft, mildly distended, appropriately tender, port sites c/d/i with dermabond, no erythema or drainage Extremities: extremities normal, atraumatic, no cyanosis or edema  Disposition: Discharge disposition: 01-Home or Self Care       Discharge Instructions    Call MD for:  difficulty breathing, headache or visual disturbances   Complete by:  As directed    Call MD for:  extreme fatigue   Complete by:  As directed    Call MD for:  persistant dizziness or light-headedness   Complete by:  As directed    Call MD for:  persistant  nausea and vomiting   Complete by:  As directed    Call MD for:  redness, tenderness, or signs of infection (pain, swelling, redness, odor or green/yellow discharge around incision site)   Complete by:  As directed    Call MD for:  severe uncontrolled pain   Complete by:  As directed    Call MD for:  temperature >100.4   Complete by:  As directed    Diet - low sodium heart healthy    Complete by:  As directed    Increase activity slowly   Complete by:  As directed      Allergies as of 02/02/2018      Reactions   Penicillins Hives, Itching, Other (See Comments)   Has taken keflex without difficulty      Has patient had a PCN reaction causing immediate rash, facial/tongue/throat swelling, SOB or lightheadedness with hypotension: Yes Has patient had a PCN reaction causing severe rash involving mucus membranes or skin necrosis: No  Has patient had a PCN reaction that required hospitalization: No Has patient had a PCN reaction occurring within the last 10 years: No If all of the above answers are "NO", then may proceed with Cephalosporin use.   Sulfa Antibiotics Hives      Medication List    TAKE these medications   acetaminophen 325 MG tablet Commonly known as:  TYLENOL Take 2 tablets (650 mg total) by mouth every 6 (six) hours as needed for mild pain (or Fever >/= 101). What changed:  when to take this   acyclovir 400 MG tablet Commonly known as:  ZOVIRAX Take 400 mg by mouth 2 (two) times daily.   atorvastatin 40 MG tablet Commonly known as:  LIPITOR Take 20 mg by mouth daily.   docusate sodium 100 MG capsule Commonly known as:  COLACE Take 1 capsule (100 mg total) by mouth 2 (two) times daily.   glipiZIDE 5 MG tablet Commonly known as:  GLUCOTROL Take 5 mg by mouth 2 (two) times daily before a meal.   hydrochlorothiazide 25 MG tablet Commonly known as:  HYDRODIURIL Take 25 mg by mouth daily.   levofloxacin 750 MG tablet Commonly known as:  LEVAQUIN Take 1 tablet (750 mg total) by mouth daily for 3 days. Start taking on:  02/03/2018   levothyroxine 100 MCG tablet Commonly known as:  SYNTHROID, LEVOTHROID Take 100 mcg by mouth daily before breakfast.   losartan 50 MG tablet Commonly known as:  COZAAR Take 50 mg by mouth daily.   metFORMIN 1000 MG tablet Commonly known as:  GLUCOPHAGE Take 1,000 mg by mouth 2 (two) times daily with a meal.    metoCLOPramide 5 MG tablet Commonly known as:  REGLAN Take 1 tablet (5 mg total) by mouth 3 (three) times daily.   ondansetron 4 MG tablet Commonly known as:  ZOFRAN Take 1 tablet (4 mg total) by mouth every 8 (eight) hours as needed for nausea or vomiting.   oxyCODONE 5 MG immediate release tablet Commonly known as:  ROXICODONE Take 1 tablet (5 mg total) by mouth every 4 (four) hours as needed.   pantoprazole 40 MG tablet Commonly known as:  PROTONIX Take 1 tablet (40 mg total) by mouth daily before breakfast.   prednisoLONE acetate 1 % ophthalmic suspension Commonly known as:  PRED FORTE Place 1 drop into the left eye as needed (eye ulcer flare).   sertraline 100 MG tablet Commonly known as:  ZOLOFT Take 100 mg by mouth 2 (  two) times daily.   trifluridine 1 % ophthalmic solution Commonly known as:  VIROPTIC Place 1 drop into the left eye as needed (eye ulcer flare).      Follow-up Information    Arsenio Katz, NP Follow up in 1 week(s).   Specialty:  Nurse Practitioner Why:  check labs due to platelets trending down while in the hospital, recheck due to your history of low platelets; also follow up for the community acquired pneumonia  Contact information: [place] [address] [phone]        Virl Cagey, MD Follow up in 2 week(s).   Specialty:  General Surgery Contact information: [address] [place] [address] [phone]           Signed: Virl Cagey 02/02/2018, 12:44 PM

## 2018-02-03 LAB — TYPE AND SCREEN
ABO/RH(D): B POS
Antibody Screen: NEGATIVE
Unit division: 0
Unit division: 0

## 2018-02-03 LAB — BPAM RBC
BLOOD PRODUCT EXPIRATION DATE: 201904092359
BLOOD PRODUCT EXPIRATION DATE: 201904092359
UNIT TYPE AND RH: 5100
Unit Type and Rh: 5100

## 2018-02-11 DIAGNOSIS — E039 Hypothyroidism, unspecified: Secondary | ICD-10-CM | POA: Diagnosis not present

## 2018-02-11 DIAGNOSIS — K760 Fatty (change of) liver, not elsewhere classified: Secondary | ICD-10-CM | POA: Diagnosis not present

## 2018-02-11 DIAGNOSIS — R161 Splenomegaly, not elsewhere classified: Secondary | ICD-10-CM | POA: Diagnosis not present

## 2018-02-11 DIAGNOSIS — Z299 Encounter for prophylactic measures, unspecified: Secondary | ICD-10-CM | POA: Diagnosis not present

## 2018-02-11 DIAGNOSIS — G4733 Obstructive sleep apnea (adult) (pediatric): Secondary | ICD-10-CM | POA: Diagnosis not present

## 2018-02-11 DIAGNOSIS — Z789 Other specified health status: Secondary | ICD-10-CM | POA: Diagnosis not present

## 2018-02-11 DIAGNOSIS — I10 Essential (primary) hypertension: Secondary | ICD-10-CM | POA: Diagnosis not present

## 2018-02-11 DIAGNOSIS — E7800 Pure hypercholesterolemia, unspecified: Secondary | ICD-10-CM | POA: Diagnosis not present

## 2018-02-11 DIAGNOSIS — E1165 Type 2 diabetes mellitus with hyperglycemia: Secondary | ICD-10-CM | POA: Diagnosis not present

## 2018-02-11 DIAGNOSIS — Z6827 Body mass index (BMI) 27.0-27.9, adult: Secondary | ICD-10-CM | POA: Diagnosis not present

## 2018-02-17 ENCOUNTER — Encounter: Payer: Self-pay | Admitting: General Surgery

## 2018-02-17 ENCOUNTER — Ambulatory Visit (INDEPENDENT_AMBULATORY_CARE_PROVIDER_SITE_OTHER): Payer: Self-pay | Admitting: General Surgery

## 2018-02-17 VITALS — BP 163/77 | HR 98 | Temp 97.1°F | Resp 18 | Ht 67.0 in | Wt 181.0 lb

## 2018-02-17 DIAGNOSIS — K8020 Calculus of gallbladder without cholecystitis without obstruction: Secondary | ICD-10-CM

## 2018-02-17 NOTE — Progress Notes (Signed)
[facility] Note   HPI:  67 y.o. Female presents to clinic for post-op follow-up evaluation after laparoscopic cholecystectomy. Patient reports she is doing great. No further nausea/vomiting. No diarrhea. She feels like a new woman.  Review of Systems:  No fevers or chills Reg BM Tolerating diet All other review of systems: otherwise negative   Pathology: Diagnosis 1. Gallbladder - CHRONIC CHOLECYSTITIS, CHOLESTEROLOSIS AND CHOLELITHIASIS. 2. Liver, biopsy - MILD STEATOSIS. - NO EVIDENCE OF STEATOHEPATITIS. - NO PATHOLOGIC FIBROSIS. Diagnosis Note 2. The liver biopsy shows a preserved architecture with mild macrovesicular steatosis (20%) with thermal artifact. There is no evidence of ballooning degeneration. The portal structures appear unremarkable. There is no significant portal or lobular inflammation. Trichrome stain does not show any evidence of pathologic fibrosis. Iron stain is negative. PAS/D stain is negative for intracytoplasmic globular inclusions. The findings are that of steatosis without evidence of steatohepatitis or fibrosis.   Vital Signs:  BP (!) 163/77   Pulse 98   Temp (!) 97.1 F (36.2 C)   Resp 18   Ht 5\' 7"  (1.702 m)   Wt 181 lb (82.1 kg)   BMI 28.35 kg/m    Physical Exam:  Physical Exam  Constitutional: She appears well-developed.  HENT:  Head: Normocephalic.  Eyes: Pupils are equal, round, and reactive to light.  Cardiovascular: Normal rate.  Pulmonary/Chest: Effort normal.  Abdominal: Soft. She exhibits no distension. There is no tenderness.  Port sites healed  Vitals reviewed.   Laboratory studies: None   Imaging:  None    Assessment:  67 y.o. yo Female s/p laparoscopic cholecystectomy. Doing well. Feels good.  Plan:  - Follow up PRN    All of the above recommendations were discussed with the patient and patient's family, and all of patient's and family's questions were answered to their expressed  satisfaction.  Curlene Labrum, MD [facility] [address] [place], [place] [address] [phone] (office)

## 2018-02-20 DIAGNOSIS — I10 Essential (primary) hypertension: Secondary | ICD-10-CM | POA: Diagnosis not present

## 2018-02-20 DIAGNOSIS — E7800 Pure hypercholesterolemia, unspecified: Secondary | ICD-10-CM | POA: Diagnosis not present

## 2018-02-20 DIAGNOSIS — E119 Type 2 diabetes mellitus without complications: Secondary | ICD-10-CM | POA: Diagnosis not present

## 2018-02-24 ENCOUNTER — Telehealth: Payer: Self-pay | Admitting: General Surgery

## 2018-02-24 DIAGNOSIS — M1712 Unilateral primary osteoarthritis, left knee: Secondary | ICD-10-CM | POA: Diagnosis not present

## 2018-02-24 NOTE — Telephone Encounter (Signed)
[facility]  Surgery 3/29 for laparoscopic cholecystectomy. Saw last week and incisions healing. Wanting to go to pool. Is about 4 weeks out now. Can go to pool.  Curlene Labrum, MD [facility] [address] [place], [place] [phone] (office)

## 2018-03-17 ENCOUNTER — Ambulatory Visit (INDEPENDENT_AMBULATORY_CARE_PROVIDER_SITE_OTHER): Payer: Medicare Other | Admitting: Internal Medicine

## 2018-03-17 ENCOUNTER — Encounter (INDEPENDENT_AMBULATORY_CARE_PROVIDER_SITE_OTHER): Payer: Self-pay | Admitting: Internal Medicine

## 2018-03-17 VITALS — BP 122/74 | HR 64 | Temp 97.7°F | Resp 18 | Ht 67.0 in | Wt 178.3 lb

## 2018-03-17 DIAGNOSIS — R110 Nausea: Secondary | ICD-10-CM | POA: Diagnosis not present

## 2018-03-17 DIAGNOSIS — R197 Diarrhea, unspecified: Secondary | ICD-10-CM

## 2018-03-17 MED ORDER — DICYCLOMINE HCL 10 MG PO CAPS: 10.0000 mg | ORAL_CAPSULE | Freq: Three times a day (TID) | ORAL | 1 refills | Status: DC

## 2018-03-17 NOTE — Progress Notes (Signed)
Presenting complaint;  Nausea vomiting and diarrhea.  Database and subjective:  Patient is 67 year old Caucasian female who presents with relapse of her symptoms of nausea and vomiting and now she also complains of diarrhea.  She was evaluated for nausea vomiting and abdominal pain earlier this year.  She was found to have cholelithiasis.  She underwent cholecystectomy with liver biopsy on 01/30/2018.  She states she had no nausea or vomiting for month or so.  Now she is also having diarrhea. She says nausea generally occurs when she is hungry or after eating. Although she complained of vomiting to my nursing staff but she states she has not had vomiting this time even though she has come close to it.  She denies abdominal pain.  She is having 5-6 stools per day.  All of her stools are loose.  She denies melena rectal bleeding fever or chills.  She says she had normal screening colonoscopy about 2 years ago. She has lost 6 pounds since her last visit of 01/27/2018. Patient tells me her daughter age 41 was recently diagnosed with celiac disease and she wonders if she has celiac disease. She had a EGD in January 2019 and duodenal mucosa was normal.  She was not aware at that time that her daughter had celiac disease. She was given prescription of metoclopramide by PCP but she decided not to take it after she read the side effects.  Patient states she takes Zoloft for stress and anxiety.    Current Medications: Outpatient Encounter Medications as of 03/17/2018  Medication Sig  . acetaminophen (TYLENOL) 325 MG tablet Take 2 tablets (650 mg total) by mouth every 6 (six) hours as needed for mild pain (or Fever >/= 101). (Patient taking differently: Take 650 mg by mouth at bedtime as needed for mild pain (or Fever >/= 101). )  . acyclovir (ZOVIRAX) 400 MG tablet Take 400 mg by mouth 2 (two) times daily.  Marland Kitchen atorvastatin (LIPITOR) 40 MG tablet Take 20 mg by mouth daily.   Marland Kitchen glipiZIDE (GLUCOTROL) 5 MG  tablet Take 5 mg by mouth 2 (two) times daily before a meal.  . hydrochlorothiazide (HYDRODIURIL) 25 MG tablet Take 25 mg by mouth daily.  Marland Kitchen levothyroxine (SYNTHROID, LEVOTHROID) 100 MCG tablet Take 100 mcg by mouth daily before breakfast.  . losartan (COZAAR) 50 MG tablet Take 50 mg by mouth daily.   . metFORMIN (GLUCOPHAGE) 1000 MG tablet Take 1,000 mg by mouth 2 (two) times daily with a meal.  . pantoprazole (PROTONIX) 40 MG tablet Take 1 tablet (40 mg total) by mouth daily before breakfast.  . prednisoLONE acetate (PRED FORTE) 1 % ophthalmic suspension Place 1 drop into the left eye as needed (eye ulcer flare).  . sertraline (ZOLOFT) 100 MG tablet Take 100 mg by mouth 2 (two) times daily.  Marland Kitchen trifluridine (VIROPTIC) 1 % ophthalmic solution Place 1 drop into the left eye as needed (eye ulcer flare).  Marland Kitchen docusate sodium (COLACE) 100 MG capsule Take 1 capsule (100 mg total) by mouth 2 (two) times daily. (Patient not taking: Reported on 03/17/2018)  . metoCLOPramide (REGLAN) 5 MG tablet Take 1 tablet (5 mg total) by mouth 3 (three) times daily. (Patient not taking: Reported on 03/17/2018)  . ondansetron (ZOFRAN) 4 MG tablet Take 1 tablet (4 mg total) by mouth every 8 (eight) hours as needed for nausea or vomiting. (Patient not taking: Reported on 03/17/2018)  . oxyCODONE (ROXICODONE) 5 MG immediate release tablet Take 1 tablet (5 mg total)  by mouth every 4 (four) hours as needed. (Patient not taking: Reported on 03/17/2018)   No facility-administered encounter medications on file as of 03/17/2018.      Objective: Blood pressure 122/74, pulse 64, temperature 97.7 F (36.5 C), temperature source Oral, resp. rate 18, height 5\' 7"  (1.702 m), weight 178 lb 4.8 oz (80.9 kg). Patient is alert and in no acute distress. Conjunctiva is pink. Sclera is nonicteric Oropharyngeal mucosa is normal. No neck masses or thyromegaly noted. Cardiac exam with regular rhythm normal S1 and S2. No murmur or gallop  noted. Lungs are clear to auscultation. Abdomen is symmetrical.  Bowel sounds are normal.  On palpation abdomen is soft and nontender.  Spleen tip is easily palpable.  Liver edge is also palpable below RCM. No LE edema or clubbing noted.  Labs/studies Results:  Lab data from 02/02/2018 Bilirubin 1.0, AP 79, AST 33, ALT 43, total protein 6.6 and albumin 3.3. Calcium 9.0. BUN 17 and creatinine 0.71.  WBC 3.1 H&H 9.4 and 29.8 MCV 76.2 and platelet count 83K.   Please note this blood work was not done through this office.  Assessment:  #1.  Nausea.  While she has had vomiting prior to gallbladder surgery but not thereafter.  Nausea could be manifestation of anxiety gastroparesis she is or secondary to her medications.  Metformin may explain all of her GI symptoms.  Similarly if she has celiac disease it would explain all of her symptoms.  #2.  Diarrhea.  Suspect she has irritable bowel syndrome with diarrhea may also be postcholecystectomy.  If she does not respond to antispasmodic will consider cholestyramine.  Given family history of celiac disease this condition needs to be ruled out as well.  #3.  Anemia.  I just noted her H&H to be low about 6 weeks ago.  H&H prior to that had been normal.  Drop in H&H possibly related to gallbladder surgery.  CBC needs to be repeated.  #4.  Thrombocytopenia.  She has splenomegaly presumed to be due to infectious mononucleosis.  Liver biopsy recently revealed stage II fibrosis.   Plan:  Dicyclomine 10 mg by mouth 3 times a day. Celiac antibody panel. We will also check CBC. Patient advised to take ondansetron for nausea and if it persists she can take metoclopramide as a back-up on as-needed basis. Progress report in 4 weeks. If celiac antibody panel is negative and she does not respond to therapy with check with PCP about dropping metformin dose. Office visit in 3 months.

## 2018-03-17 NOTE — Patient Instructions (Signed)
Can take Reglan/metoclopramide for nausea if ondansetron/Zofran does not work. If blood test for celiac disease is negative would ask primary care physician to reduce metformin dose as it might be causing some of the GI symptoms. Progress report in 4 weeks.

## 2018-03-18 ENCOUNTER — Other Ambulatory Visit (INDEPENDENT_AMBULATORY_CARE_PROVIDER_SITE_OTHER): Payer: Self-pay | Admitting: *Deleted

## 2018-03-18 DIAGNOSIS — D649 Anemia, unspecified: Secondary | ICD-10-CM

## 2018-03-18 LAB — CBC
HCT: 40.7 % (ref 35.0–45.0)
Hemoglobin: 13.4 g/dL (ref 11.7–15.5)
MCH: 23.6 pg — ABNORMAL LOW (ref 27.0–33.0)
MCHC: 32.9 g/dL (ref 32.0–36.0)
MCV: 71.8 fL — AB (ref 80.0–100.0)
MPV: 10.8 fL (ref 7.5–12.5)
Platelets: 199 10*3/uL (ref 140–400)
RBC: 5.67 10*6/uL — ABNORMAL HIGH (ref 3.80–5.10)
RDW: 17 % — AB (ref 11.0–15.0)
WBC: 6.4 10*3/uL (ref 3.8–10.8)

## 2018-03-19 LAB — GLIADIN ANTIBODIES, SERUM
GLIADIN IGA: 8 U
GLIADIN IGG: 3 U

## 2018-03-19 LAB — TISSUE TRANSGLUTAMINASE, IGA: (tTG) Ab, IgA: 1 U/mL

## 2018-03-19 LAB — RETICULIN ANTIBODIES, IGA W TITER: RETICULIN IGA SCREEN: NEGATIVE

## 2018-03-20 DIAGNOSIS — E119 Type 2 diabetes mellitus without complications: Secondary | ICD-10-CM | POA: Diagnosis not present

## 2018-03-20 DIAGNOSIS — E7800 Pure hypercholesterolemia, unspecified: Secondary | ICD-10-CM | POA: Diagnosis not present

## 2018-03-20 DIAGNOSIS — I10 Essential (primary) hypertension: Secondary | ICD-10-CM | POA: Diagnosis not present

## 2018-03-23 ENCOUNTER — Encounter (INDEPENDENT_AMBULATORY_CARE_PROVIDER_SITE_OTHER): Payer: Self-pay

## 2018-04-08 DIAGNOSIS — I10 Essential (primary) hypertension: Secondary | ICD-10-CM | POA: Diagnosis not present

## 2018-04-08 DIAGNOSIS — E039 Hypothyroidism, unspecified: Secondary | ICD-10-CM | POA: Diagnosis not present

## 2018-04-08 DIAGNOSIS — Z299 Encounter for prophylactic measures, unspecified: Secondary | ICD-10-CM | POA: Diagnosis not present

## 2018-04-08 DIAGNOSIS — Z7189 Other specified counseling: Secondary | ICD-10-CM | POA: Diagnosis not present

## 2018-04-08 DIAGNOSIS — Z1339 Encounter for screening examination for other mental health and behavioral disorders: Secondary | ICD-10-CM | POA: Diagnosis not present

## 2018-04-08 DIAGNOSIS — Z0000 Encounter for general adult medical examination without abnormal findings: Secondary | ICD-10-CM | POA: Diagnosis not present

## 2018-04-08 DIAGNOSIS — Z1331 Encounter for screening for depression: Secondary | ICD-10-CM | POA: Diagnosis not present

## 2018-04-08 DIAGNOSIS — Z1211 Encounter for screening for malignant neoplasm of colon: Secondary | ICD-10-CM | POA: Diagnosis not present

## 2018-04-08 DIAGNOSIS — Z6827 Body mass index (BMI) 27.0-27.9, adult: Secondary | ICD-10-CM | POA: Diagnosis not present

## 2018-04-08 DIAGNOSIS — E1165 Type 2 diabetes mellitus with hyperglycemia: Secondary | ICD-10-CM | POA: Diagnosis not present

## 2018-04-08 DIAGNOSIS — K760 Fatty (change of) liver, not elsewhere classified: Secondary | ICD-10-CM | POA: Diagnosis not present

## 2018-04-08 DIAGNOSIS — E7800 Pure hypercholesterolemia, unspecified: Secondary | ICD-10-CM | POA: Diagnosis not present

## 2018-04-09 DIAGNOSIS — E7800 Pure hypercholesterolemia, unspecified: Secondary | ICD-10-CM | POA: Diagnosis not present

## 2018-04-09 DIAGNOSIS — E039 Hypothyroidism, unspecified: Secondary | ICD-10-CM | POA: Diagnosis not present

## 2018-04-09 DIAGNOSIS — R5383 Other fatigue: Secondary | ICD-10-CM | POA: Diagnosis not present

## 2018-04-09 DIAGNOSIS — Z79899 Other long term (current) drug therapy: Secondary | ICD-10-CM | POA: Diagnosis not present

## 2018-04-09 DIAGNOSIS — D509 Iron deficiency anemia, unspecified: Secondary | ICD-10-CM | POA: Diagnosis not present

## 2018-05-13 DIAGNOSIS — I10 Essential (primary) hypertension: Secondary | ICD-10-CM | POA: Diagnosis not present

## 2018-05-13 DIAGNOSIS — E7800 Pure hypercholesterolemia, unspecified: Secondary | ICD-10-CM | POA: Diagnosis not present

## 2018-05-13 DIAGNOSIS — E119 Type 2 diabetes mellitus without complications: Secondary | ICD-10-CM | POA: Diagnosis not present

## 2018-05-19 DIAGNOSIS — L600 Ingrowing nail: Secondary | ICD-10-CM | POA: Diagnosis not present

## 2018-05-19 DIAGNOSIS — G4733 Obstructive sleep apnea (adult) (pediatric): Secondary | ICD-10-CM | POA: Diagnosis not present

## 2018-05-19 DIAGNOSIS — Z299 Encounter for prophylactic measures, unspecified: Secondary | ICD-10-CM | POA: Diagnosis not present

## 2018-05-19 DIAGNOSIS — E1165 Type 2 diabetes mellitus with hyperglycemia: Secondary | ICD-10-CM | POA: Diagnosis not present

## 2018-05-19 DIAGNOSIS — I10 Essential (primary) hypertension: Secondary | ICD-10-CM | POA: Diagnosis not present

## 2018-05-19 DIAGNOSIS — Z6826 Body mass index (BMI) 26.0-26.9, adult: Secondary | ICD-10-CM | POA: Diagnosis not present

## 2018-05-26 DIAGNOSIS — E8940 Asymptomatic postprocedural ovarian failure: Secondary | ICD-10-CM | POA: Diagnosis not present

## 2018-05-26 DIAGNOSIS — M8580 Other specified disorders of bone density and structure, unspecified site: Secondary | ICD-10-CM | POA: Diagnosis not present

## 2018-06-01 DIAGNOSIS — M79675 Pain in left toe(s): Secondary | ICD-10-CM | POA: Diagnosis not present

## 2018-06-01 DIAGNOSIS — L600 Ingrowing nail: Secondary | ICD-10-CM | POA: Diagnosis not present

## 2018-06-08 DIAGNOSIS — E119 Type 2 diabetes mellitus without complications: Secondary | ICD-10-CM | POA: Diagnosis not present

## 2018-06-08 DIAGNOSIS — E7800 Pure hypercholesterolemia, unspecified: Secondary | ICD-10-CM | POA: Diagnosis not present

## 2018-06-08 DIAGNOSIS — I10 Essential (primary) hypertension: Secondary | ICD-10-CM | POA: Diagnosis not present

## 2018-06-10 DIAGNOSIS — F419 Anxiety disorder, unspecified: Secondary | ICD-10-CM | POA: Diagnosis not present

## 2018-06-10 DIAGNOSIS — Z79899 Other long term (current) drug therapy: Secondary | ICD-10-CM | POA: Diagnosis not present

## 2018-06-10 DIAGNOSIS — S59911A Unspecified injury of right forearm, initial encounter: Secondary | ICD-10-CM | POA: Diagnosis not present

## 2018-06-10 DIAGNOSIS — S52501A Unspecified fracture of the lower end of right radius, initial encounter for closed fracture: Secondary | ICD-10-CM | POA: Diagnosis not present

## 2018-06-10 DIAGNOSIS — S6991XA Unspecified injury of right wrist, hand and finger(s), initial encounter: Secondary | ICD-10-CM | POA: Diagnosis not present

## 2018-06-10 DIAGNOSIS — M79601 Pain in right arm: Secondary | ICD-10-CM | POA: Diagnosis not present

## 2018-06-10 DIAGNOSIS — M1990 Unspecified osteoarthritis, unspecified site: Secondary | ICD-10-CM | POA: Diagnosis not present

## 2018-06-10 DIAGNOSIS — S59901A Unspecified injury of right elbow, initial encounter: Secondary | ICD-10-CM | POA: Diagnosis not present

## 2018-06-10 DIAGNOSIS — Z7984 Long term (current) use of oral hypoglycemic drugs: Secondary | ICD-10-CM | POA: Diagnosis not present

## 2018-06-10 DIAGNOSIS — G4730 Sleep apnea, unspecified: Secondary | ICD-10-CM | POA: Diagnosis not present

## 2018-06-10 DIAGNOSIS — S52571A Other intraarticular fracture of lower end of right radius, initial encounter for closed fracture: Secondary | ICD-10-CM | POA: Diagnosis not present

## 2018-06-10 DIAGNOSIS — S90414A Abrasion, right lesser toe(s), initial encounter: Secondary | ICD-10-CM | POA: Diagnosis not present

## 2018-06-10 DIAGNOSIS — S0990XA Unspecified injury of head, initial encounter: Secondary | ICD-10-CM | POA: Diagnosis not present

## 2018-06-10 DIAGNOSIS — S52041A Displaced fracture of coronoid process of right ulna, initial encounter for closed fracture: Secondary | ICD-10-CM | POA: Diagnosis not present

## 2018-06-10 DIAGNOSIS — K219 Gastro-esophageal reflux disease without esophagitis: Secondary | ICD-10-CM | POA: Diagnosis not present

## 2018-06-10 DIAGNOSIS — S52121A Displaced fracture of head of right radius, initial encounter for closed fracture: Secondary | ICD-10-CM | POA: Diagnosis not present

## 2018-06-10 DIAGNOSIS — E119 Type 2 diabetes mellitus without complications: Secondary | ICD-10-CM | POA: Diagnosis not present

## 2018-06-10 DIAGNOSIS — W010XXA Fall on same level from slipping, tripping and stumbling without subsequent striking against object, initial encounter: Secondary | ICD-10-CM | POA: Diagnosis not present

## 2018-06-10 DIAGNOSIS — E039 Hypothyroidism, unspecified: Secondary | ICD-10-CM | POA: Diagnosis not present

## 2018-06-10 DIAGNOSIS — S4991XA Unspecified injury of right shoulder and upper arm, initial encounter: Secondary | ICD-10-CM | POA: Diagnosis not present

## 2018-06-10 DIAGNOSIS — S299XXA Unspecified injury of thorax, initial encounter: Secondary | ICD-10-CM | POA: Diagnosis not present

## 2018-06-11 DIAGNOSIS — S42401A Unspecified fracture of lower end of right humerus, initial encounter for closed fracture: Secondary | ICD-10-CM | POA: Diagnosis not present

## 2018-06-11 DIAGNOSIS — Z299 Encounter for prophylactic measures, unspecified: Secondary | ICD-10-CM | POA: Diagnosis not present

## 2018-06-11 DIAGNOSIS — I10 Essential (primary) hypertension: Secondary | ICD-10-CM | POA: Diagnosis not present

## 2018-06-11 DIAGNOSIS — Z6827 Body mass index (BMI) 27.0-27.9, adult: Secondary | ICD-10-CM | POA: Diagnosis not present

## 2018-06-11 DIAGNOSIS — Z6826 Body mass index (BMI) 26.0-26.9, adult: Secondary | ICD-10-CM | POA: Diagnosis not present

## 2018-06-11 DIAGNOSIS — E039 Hypothyroidism, unspecified: Secondary | ICD-10-CM | POA: Diagnosis not present

## 2018-06-11 DIAGNOSIS — S62101A Fracture of unspecified carpal bone, right wrist, initial encounter for closed fracture: Secondary | ICD-10-CM | POA: Diagnosis not present

## 2018-06-15 DIAGNOSIS — S52124A Nondisplaced fracture of head of right radius, initial encounter for closed fracture: Secondary | ICD-10-CM | POA: Diagnosis not present

## 2018-06-15 DIAGNOSIS — W19XXXA Unspecified fall, initial encounter: Secondary | ICD-10-CM | POA: Diagnosis not present

## 2018-06-15 DIAGNOSIS — S52514A Nondisplaced fracture of right radial styloid process, initial encounter for closed fracture: Secondary | ICD-10-CM | POA: Diagnosis not present

## 2018-06-15 DIAGNOSIS — L600 Ingrowing nail: Secondary | ICD-10-CM | POA: Diagnosis not present

## 2018-06-15 DIAGNOSIS — I7300 Raynaud's syndrome without gangrene: Secondary | ICD-10-CM | POA: Diagnosis not present

## 2018-06-15 DIAGNOSIS — E1140 Type 2 diabetes mellitus with diabetic neuropathy, unspecified: Secondary | ICD-10-CM | POA: Diagnosis not present

## 2018-06-16 ENCOUNTER — Encounter (INDEPENDENT_AMBULATORY_CARE_PROVIDER_SITE_OTHER): Payer: Self-pay | Admitting: Internal Medicine

## 2018-06-16 ENCOUNTER — Ambulatory Visit (INDEPENDENT_AMBULATORY_CARE_PROVIDER_SITE_OTHER): Payer: Medicare Other | Admitting: Internal Medicine

## 2018-06-16 VITALS — BP 110/70 | HR 72 | Temp 98.2°F | Resp 18 | Ht 67.0 in | Wt 176.6 lb

## 2018-06-16 DIAGNOSIS — R197 Diarrhea, unspecified: Secondary | ICD-10-CM

## 2018-06-16 DIAGNOSIS — R112 Nausea with vomiting, unspecified: Secondary | ICD-10-CM | POA: Diagnosis not present

## 2018-06-16 NOTE — Progress Notes (Signed)
Presenting complaint;  Follow-up for nausea vomiting and diarrhea.  Database and subjective:  Patient is 67 year old Caucasian female whose has history of nausea and vomiting and she was found to have cholelithiasis and she underwent surgery by Dr. Constance Haw on 01/30/2018.  She was last seen about 3 months ago and was still having nausea and intermittent diarrhea.  She was also noted to have low hemoglobin.  I felt was due to recent surgery.  CBC was repeated on 03/18/2018 and H&H was normal at 13.4 and 40.7.  Because of her diarrhea she was also screened for celiac disease and test was negative.  She now returns for scheduled visit accompanied by her husband.  Since her last visit she has not developed new problem.  She fell and broke her right arm.  She has right arm sling. She says her nausea comes and goes.  She had 2 episodes of vomiting since her last visit 3 months ago.  1 of the episodes occurred after she ate oatmeal at breakfast.  She did not throw up food that she had eaten several hours earlier.  She has taken metoclopramide 7 or 8 times since her last visit.  Overall she feels better.  She is on ibuprofen 40 mg 3 times a day but she believes she will be coming off this medication soon as her arm pain is much less.  She states her diarrhea has improved on most days she has 1-2 formed stools.  Every now and then she may have loose stool.  She denies melena or rectal bleeding.  She says her appetite is good.  She has lost 2 pounds since her last visit.  She has not experienced any side effects with metoclopramide although she has taken a few doses.  Current Medications: Outpatient Encounter Medications as of 06/16/2018  Medication Sig  . acetaminophen (TYLENOL) 325 MG tablet Take 2 tablets (650 mg total) by mouth every 6 (six) hours as needed for mild pain (or Fever >/= 101). (Patient taking differently: Take 650 mg by mouth at bedtime as needed for mild pain (or Fever >/= 101). )  . acyclovir  (ZOVIRAX) 400 MG tablet Take 400 mg by mouth 2 (two) times daily.  Marland Kitchen atorvastatin (LIPITOR) 40 MG tablet Take 20 mg by mouth daily.   Marland Kitchen glipiZIDE (GLUCOTROL) 5 MG tablet Take 5 mg by mouth 2 (two) times daily before a meal.  . hydrochlorothiazide (HYDRODIURIL) 25 MG tablet Take 25 mg by mouth daily.  Marland Kitchen levothyroxine (SYNTHROID, LEVOTHROID) 100 MCG tablet Take 100 mcg by mouth daily before breakfast.  . losartan (COZAAR) 50 MG tablet Take 50 mg by mouth daily.   . metFORMIN (GLUCOPHAGE) 1000 MG tablet Take 1,000 mg by mouth 2 (two) times daily with a meal.  . pantoprazole (PROTONIX) 40 MG tablet Take 1 tablet (40 mg total) by mouth daily before breakfast.  . prednisoLONE acetate (PRED FORTE) 1 % ophthalmic suspension Place 1 drop into the left eye as needed (eye ulcer flare).  . sertraline (ZOLOFT) 100 MG tablet Take 100 mg by mouth 2 (two) times daily.  Marland Kitchen trifluridine (VIROPTIC) 1 % ophthalmic solution Place 1 drop into the left eye as needed (eye ulcer flare).  Marland Kitchen dicyclomine (BENTYL) 10 MG capsule Take 1 capsule (10 mg total) by mouth 4 (four) times daily -  before meals and at bedtime. (Patient not taking: Reported on 06/16/2018)  . metoCLOPramide (REGLAN) 5 MG tablet Take 1 tablet (5 mg total) by mouth 2 (two) times  daily as needed for nausea.  . ondansetron (ZOFRAN) 4 MG tablet Take 1 tablet (4 mg total) by mouth every 8 (eight) hours as needed for nausea or vomiting. (Patient not taking: Reported on 03/17/2018)   No facility-administered encounter medications on file as of 06/16/2018.      Objective: Blood pressure 110/70, pulse 72, temperature 98.2 F (36.8 C), temperature source Oral, resp. rate 18, height 5\' 7"  (1.702 m), weight 176 lb 9.6 oz (80.1 kg). Patient is alert and in no acute distress. Conjunctiva is pink. Sclera is nonicteric Oropharyngeal mucosa is normal. No neck masses or thyromegaly noted. Cardiac exam with regular rhythm normal S1 and S2. No murmur or gallop  noted. Lungs are clear to auscultation. Abdomen is symmetrical.  Bowel sounds are normal.  On palpation abdomen is soft and nontender with organomegaly or masses. No LE edema or clubbing noted. She has sling to support her right arm and forearm.   Assessment:  #1.  Nausea with sporadic vomiting.  Symptomatically she is improved.  She has undergone cholecystectomy back in March 2019 with significant symptomatic improvement but not complete resolution.  She had a EGD and did not have peptic ulcer disease.  She also had solid-phase gastric emptying study in February this year was normal.  Therefore nausea may be manifestation of stress disorder or related to metformin.  Since she is doing better we will continue to monitor.  #2.  Chronic/intermittent diarrhea.  She is up-to-date on screening for CRC.  Suspect she has IBS although diarrhea may be due to Metformin.   Plan:  Patient advised to take ibuprofen with snack or food. She will call office if diarrhea or relapses and persist in which case we will talk with PCP about Metformin dose reduction. Office visit in 6 months.

## 2018-06-16 NOTE — Patient Instructions (Addendum)
Notify if diarrhea relapses in which case we will ask primary care physician to decrease Metformin dose. Always take ibuprofen with snack or food.

## 2018-06-25 DIAGNOSIS — B0052 Herpesviral keratitis: Secondary | ICD-10-CM | POA: Diagnosis not present

## 2018-06-25 DIAGNOSIS — H353132 Nonexudative age-related macular degeneration, bilateral, intermediate dry stage: Secondary | ICD-10-CM | POA: Diagnosis not present

## 2018-07-13 DIAGNOSIS — S52514D Nondisplaced fracture of right radial styloid process, subsequent encounter for closed fracture with routine healing: Secondary | ICD-10-CM | POA: Diagnosis not present

## 2018-07-13 DIAGNOSIS — S52124D Nondisplaced fracture of head of right radius, subsequent encounter for closed fracture with routine healing: Secondary | ICD-10-CM | POA: Diagnosis not present

## 2018-07-15 DIAGNOSIS — E7800 Pure hypercholesterolemia, unspecified: Secondary | ICD-10-CM | POA: Diagnosis not present

## 2018-07-15 DIAGNOSIS — I10 Essential (primary) hypertension: Secondary | ICD-10-CM | POA: Diagnosis not present

## 2018-07-15 DIAGNOSIS — E119 Type 2 diabetes mellitus without complications: Secondary | ICD-10-CM | POA: Diagnosis not present

## 2018-07-16 DIAGNOSIS — B0052 Herpesviral keratitis: Secondary | ICD-10-CM | POA: Diagnosis not present

## 2018-08-10 DIAGNOSIS — S52514D Nondisplaced fracture of right radial styloid process, subsequent encounter for closed fracture with routine healing: Secondary | ICD-10-CM | POA: Diagnosis not present

## 2018-08-10 DIAGNOSIS — Z23 Encounter for immunization: Secondary | ICD-10-CM | POA: Diagnosis not present

## 2018-08-12 DIAGNOSIS — E119 Type 2 diabetes mellitus without complications: Secondary | ICD-10-CM | POA: Diagnosis not present

## 2018-08-12 DIAGNOSIS — E7800 Pure hypercholesterolemia, unspecified: Secondary | ICD-10-CM | POA: Diagnosis not present

## 2018-08-12 DIAGNOSIS — I10 Essential (primary) hypertension: Secondary | ICD-10-CM | POA: Diagnosis not present

## 2018-08-25 DIAGNOSIS — Z96652 Presence of left artificial knee joint: Secondary | ICD-10-CM | POA: Diagnosis not present

## 2018-08-25 DIAGNOSIS — M1712 Unilateral primary osteoarthritis, left knee: Secondary | ICD-10-CM | POA: Diagnosis not present

## 2018-08-26 DIAGNOSIS — Z299 Encounter for prophylactic measures, unspecified: Secondary | ICD-10-CM | POA: Diagnosis not present

## 2018-08-26 DIAGNOSIS — Z713 Dietary counseling and surveillance: Secondary | ICD-10-CM | POA: Diagnosis not present

## 2018-08-26 DIAGNOSIS — I10 Essential (primary) hypertension: Secondary | ICD-10-CM | POA: Diagnosis not present

## 2018-08-26 DIAGNOSIS — E1165 Type 2 diabetes mellitus with hyperglycemia: Secondary | ICD-10-CM | POA: Diagnosis not present

## 2018-08-26 DIAGNOSIS — Z6827 Body mass index (BMI) 27.0-27.9, adult: Secondary | ICD-10-CM | POA: Diagnosis not present

## 2018-08-27 DIAGNOSIS — B0052 Herpesviral keratitis: Secondary | ICD-10-CM | POA: Diagnosis not present

## 2018-09-29 DIAGNOSIS — I10 Essential (primary) hypertension: Secondary | ICD-10-CM | POA: Diagnosis not present

## 2018-09-29 DIAGNOSIS — E7800 Pure hypercholesterolemia, unspecified: Secondary | ICD-10-CM | POA: Diagnosis not present

## 2018-09-29 DIAGNOSIS — E119 Type 2 diabetes mellitus without complications: Secondary | ICD-10-CM | POA: Diagnosis not present

## 2018-11-02 DIAGNOSIS — E1165 Type 2 diabetes mellitus with hyperglycemia: Secondary | ICD-10-CM | POA: Diagnosis not present

## 2018-11-02 DIAGNOSIS — Z299 Encounter for prophylactic measures, unspecified: Secondary | ICD-10-CM | POA: Diagnosis not present

## 2018-11-02 DIAGNOSIS — Z6827 Body mass index (BMI) 27.0-27.9, adult: Secondary | ICD-10-CM | POA: Diagnosis not present

## 2018-11-02 DIAGNOSIS — R42 Dizziness and giddiness: Secondary | ICD-10-CM | POA: Diagnosis not present

## 2018-11-02 DIAGNOSIS — K219 Gastro-esophageal reflux disease without esophagitis: Secondary | ICD-10-CM | POA: Diagnosis not present

## 2018-11-02 DIAGNOSIS — I10 Essential (primary) hypertension: Secondary | ICD-10-CM | POA: Diagnosis not present

## 2018-11-10 DIAGNOSIS — E1140 Type 2 diabetes mellitus with diabetic neuropathy, unspecified: Secondary | ICD-10-CM | POA: Diagnosis not present

## 2018-11-10 DIAGNOSIS — E1159 Type 2 diabetes mellitus with other circulatory complications: Secondary | ICD-10-CM | POA: Diagnosis not present

## 2018-11-10 DIAGNOSIS — H81393 Other peripheral vertigo, bilateral: Secondary | ICD-10-CM | POA: Diagnosis not present

## 2018-11-26 DIAGNOSIS — E039 Hypothyroidism, unspecified: Secondary | ICD-10-CM | POA: Diagnosis not present

## 2018-11-26 DIAGNOSIS — I10 Essential (primary) hypertension: Secondary | ICD-10-CM | POA: Diagnosis not present

## 2018-11-26 DIAGNOSIS — E1165 Type 2 diabetes mellitus with hyperglycemia: Secondary | ICD-10-CM | POA: Diagnosis not present

## 2018-11-26 DIAGNOSIS — Z299 Encounter for prophylactic measures, unspecified: Secondary | ICD-10-CM | POA: Diagnosis not present

## 2018-11-26 DIAGNOSIS — R42 Dizziness and giddiness: Secondary | ICD-10-CM | POA: Diagnosis not present

## 2018-11-26 DIAGNOSIS — Z6828 Body mass index (BMI) 28.0-28.9, adult: Secondary | ICD-10-CM | POA: Diagnosis not present

## 2018-11-26 DIAGNOSIS — Z789 Other specified health status: Secondary | ICD-10-CM | POA: Diagnosis not present

## 2018-11-28 IMAGING — CT CT ABDOMEN W/ CM
3 of 5 series · 17 of 46 positions shown, 19 images · IV contrast (Isovue)
Comparison: None.

CLINICAL DATA: Nausea and vomiting and diarrhea. 23 lb weight loss
over past 3 months. Splenomegaly. Cholelithiasis and hepatic
steatosis.

EXAM:
CT ABDOMEN WITH CONTRAST
TECHNIQUE: Multidetector CT imaging of the abdomen was performed using the
standard protocol following bolus administration of intravenous
contrast.
CONTRAST:  100mL KRRI4O-355 IOPAMIDOL (KRRI4O-355) INJECTION 61%

[Series 2: axial st · axial · 0.78mm/px · z∈[+1163,+1423]mm · 11 of 64 slices shown, 13 images]
[im 6/64  soft-tissue]
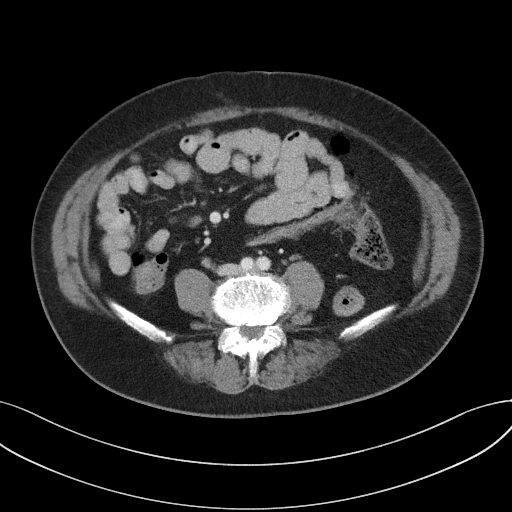
[im 6/64  bone]
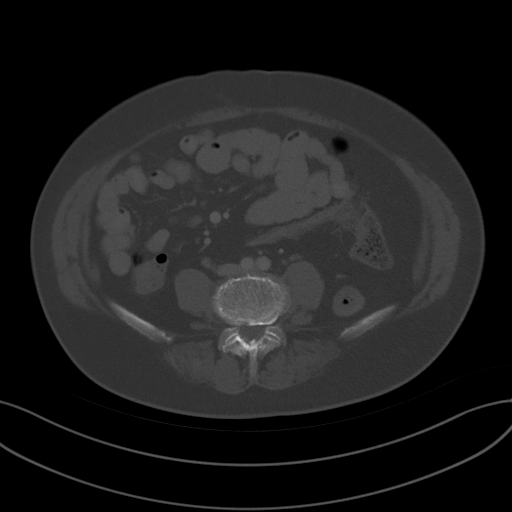
[im 11/64  soft-tissue]
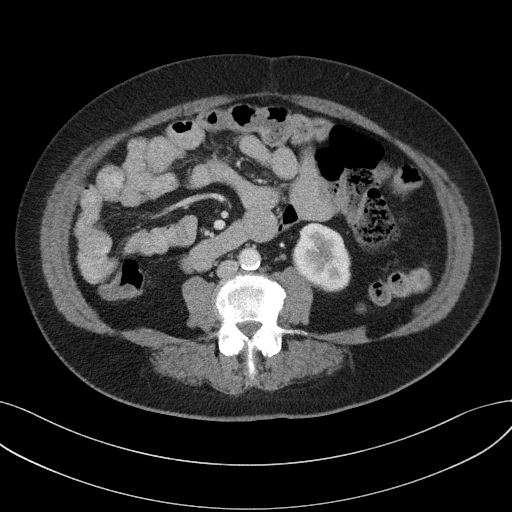
[im 16/64  soft-tissue]
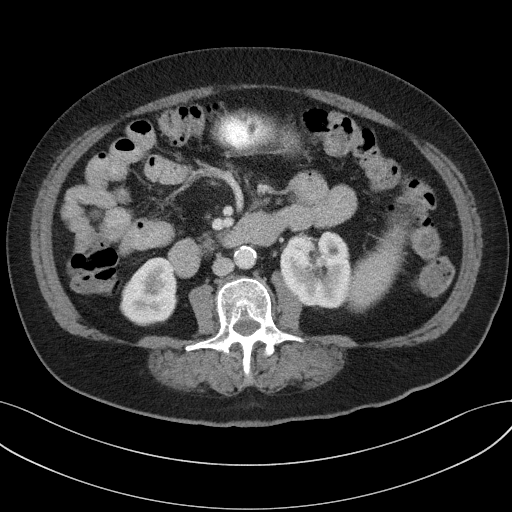
[im 22/64  soft-tissue]
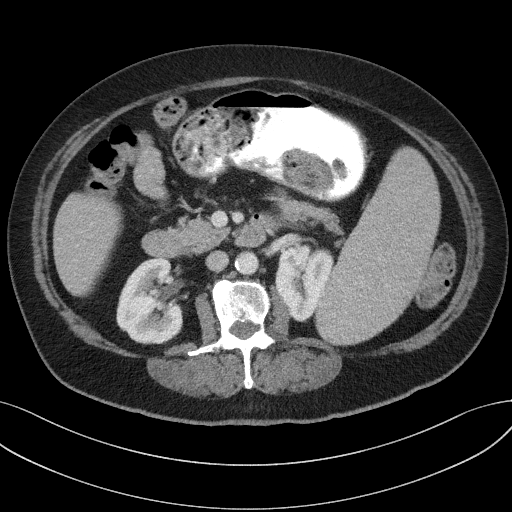
[im 27/64  soft-tissue]
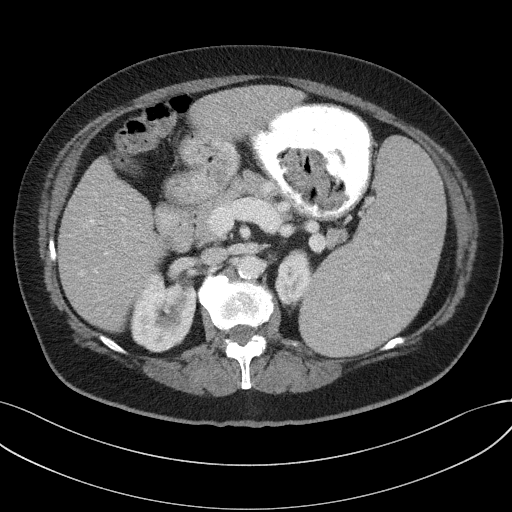
[im 32/64  soft-tissue]
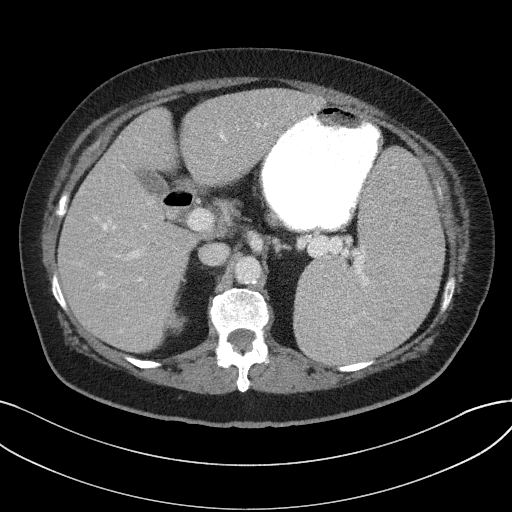
[im 37/64  soft-tissue]
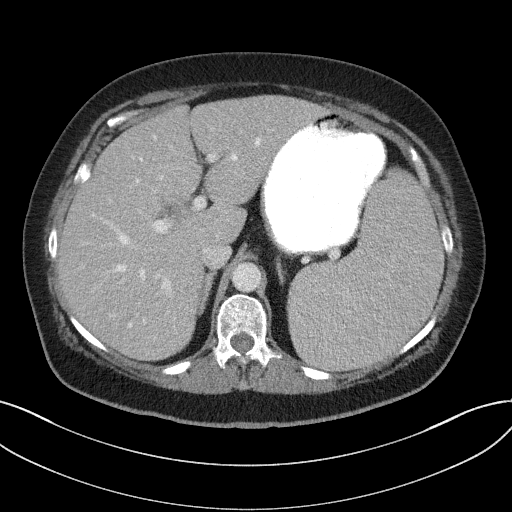
[im 43/64  soft-tissue]
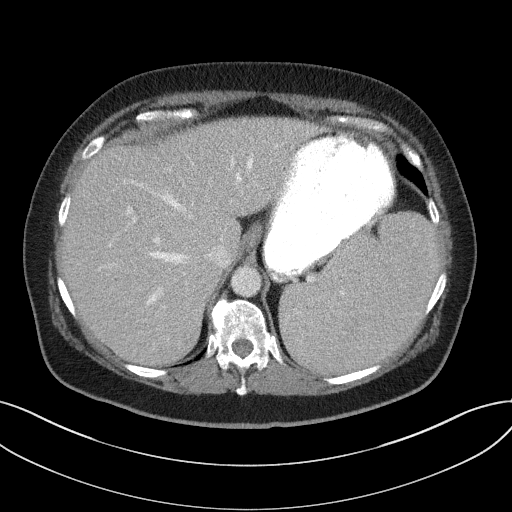
[im 48/64  soft-tissue]
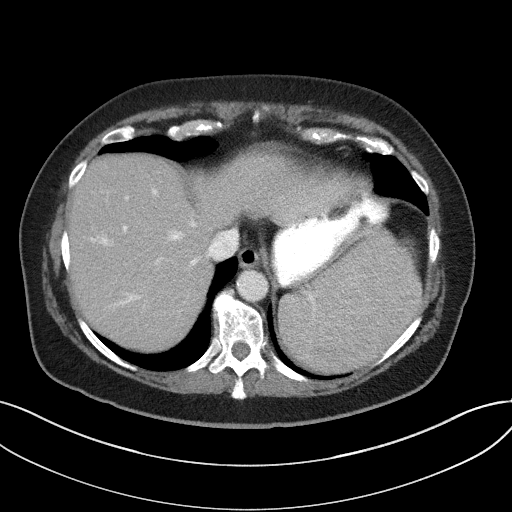
[im 48/64  bone]
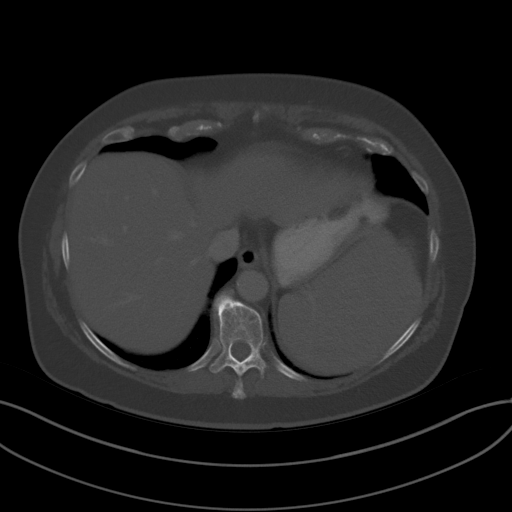
[im 53/64  soft-tissue]
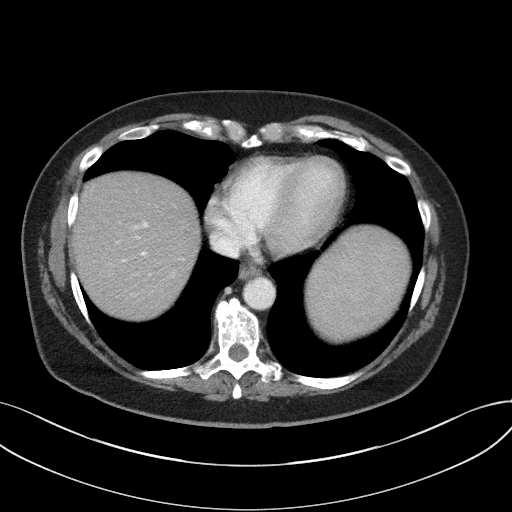
[im 58/64  soft-tissue]
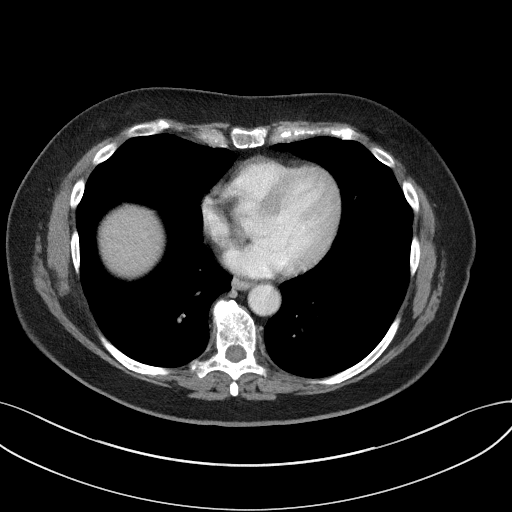

[Series 4: coronal st · coronal · 0.66mm/px · 3 of 96 slices shown]
[im 32/96  soft-tissue]
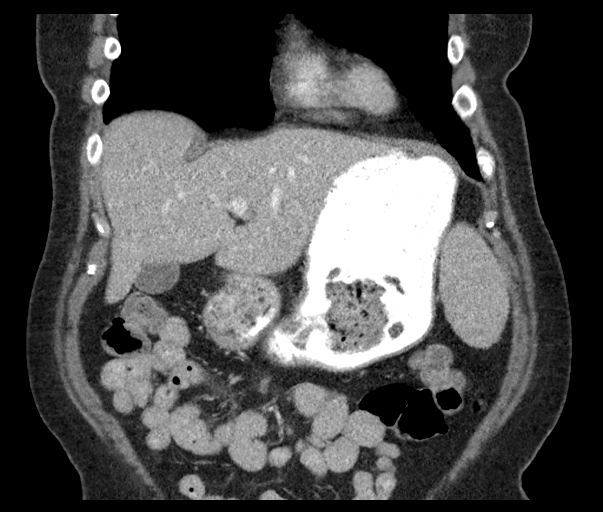
[im 43/96  soft-tissue]
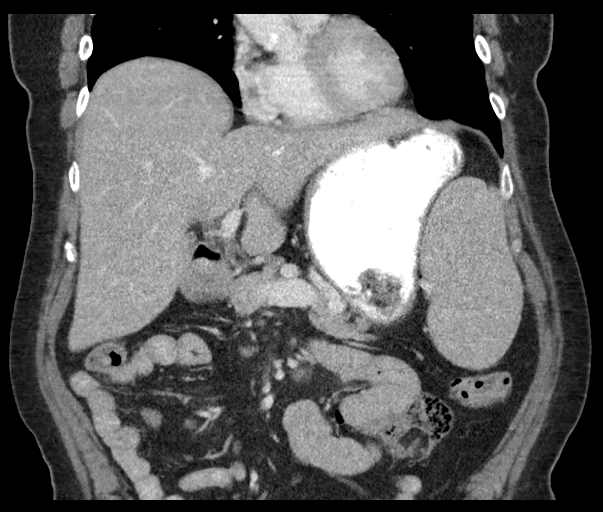
[im 53/96  soft-tissue]
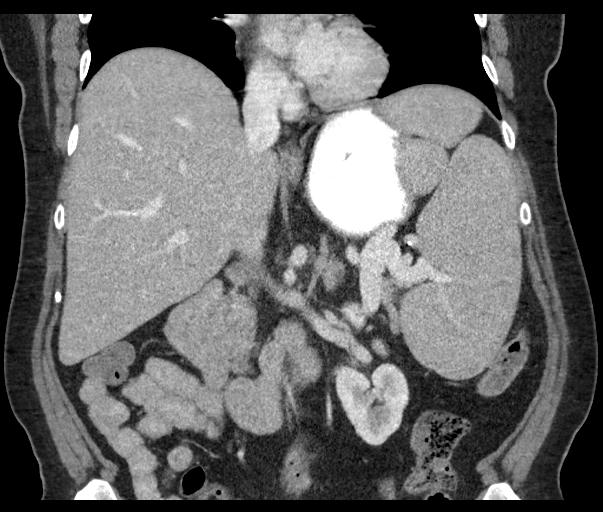

[Series 6: lung bases · axial · 0.78mm/px · z∈[+1338,+1393]mm · 3 of 29 slices shown]
[im 6/29  bone]
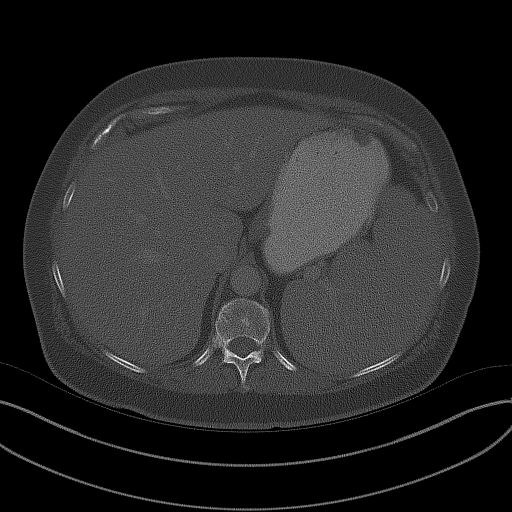
[im 12/29  bone]
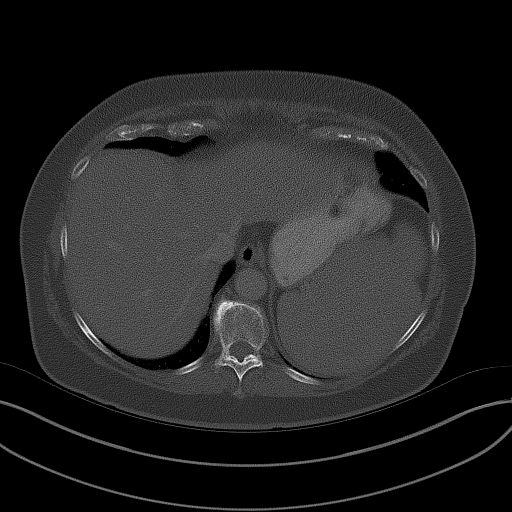
[im 17/29  bone]
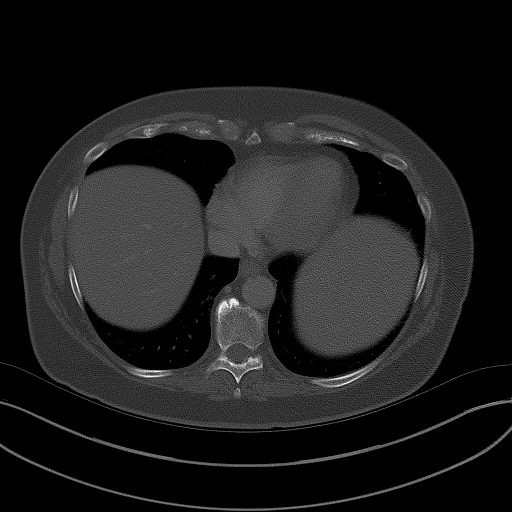

[17 of 46 positions shown; findings below may reference images not displayed]

FINDINGS: Lower chest: No acute findings.

Hepatobiliary: No hepatic masses identified. Tiny calcified
gallstones are noted, however there is no evidence of cholecystitis
or biliary ductal dilatation.

Pancreas:  No mass or inflammatory changes.

Spleen: Moderate to severe splenomegaly with length measuring
approximately 20-21 cm. No splenic masses identified.

Adrenals/Urinary Tract: No masses identified. A few tiny sub-cm
renal cysts noted. No evidence of hydronephrosis.

Stomach/Bowel: Visualized abdominal bowel unremarkable.

Vascular/Lymphatic: No pathologically enlarged lymph nodes
identified. No abdominal aortic aneurysm. Aortic atherosclerosis.

Other:  None.

Musculoskeletal:  No suspicious bone lesions identified.
IMPRESSION: Marked splenomegaly. No lymphadenopathy or other abdominal soft
tissue masses identified.

Cholelithiasis.  No radiographic evidence of cholecystitis.

## 2018-12-03 DIAGNOSIS — E119 Type 2 diabetes mellitus without complications: Secondary | ICD-10-CM | POA: Diagnosis not present

## 2018-12-03 DIAGNOSIS — E7800 Pure hypercholesterolemia, unspecified: Secondary | ICD-10-CM | POA: Diagnosis not present

## 2018-12-03 DIAGNOSIS — I10 Essential (primary) hypertension: Secondary | ICD-10-CM | POA: Diagnosis not present

## 2018-12-22 ENCOUNTER — Ambulatory Visit (INDEPENDENT_AMBULATORY_CARE_PROVIDER_SITE_OTHER): Payer: Medicare Other | Admitting: Internal Medicine

## 2018-12-22 ENCOUNTER — Encounter (INDEPENDENT_AMBULATORY_CARE_PROVIDER_SITE_OTHER): Payer: Self-pay | Admitting: Internal Medicine

## 2018-12-22 VITALS — BP 124/76 | HR 91 | Temp 98.7°F | Resp 18 | Ht 67.0 in | Wt 185.3 lb

## 2018-12-22 DIAGNOSIS — K219 Gastro-esophageal reflux disease without esophagitis: Secondary | ICD-10-CM

## 2018-12-22 MED ORDER — FAMOTIDINE 20 MG PO TABS: 20.0000 mg | ORAL_TABLET | Freq: Every day | ORAL | Status: AC

## 2018-12-22 NOTE — Patient Instructions (Signed)
Take Pepcid OTC or famotidine OTC 20 mg by mouth daily at bedtime for 1 month then thereafter on as-needed basis. Keep symptom diary as to frequency of nocturnal heartburn.

## 2018-12-22 NOTE — Progress Notes (Signed)
Presenting complaint;  Nocturnal heartburn.  Subjective:  Patient is 68 year old Caucasian female who is here for scheduled visit.  She was last seen in August 2019.  She has a history of nausea and vomiting.  Since her gallbladder surgery last year she had improved significantly.  She says she has not had any vomiting spells since her last visit but she has had few episodes of nausea.  She states she was taking pantoprazole on as-needed basis.  She does not have heartburn during the daytime but she has heartburn at least once during the night.  She generally wakes up at 2:00 in the morning.  She denies abdominal pain melena or rectal bleeding.  She walks for 20 to 2025 minutes at least 3-4 times each week.  She is trying to lose weight.  She has gained 9 pounds since her last visit.    Current Medications: Outpatient Encounter Medications as of 12/22/2018  Medication Sig  . acetaminophen (TYLENOL) 325 MG tablet Take 2 tablets (650 mg total) by mouth every 6 (six) hours as needed for mild pain (or Fever >/= 101). (Patient taking differently: Take 650 mg by mouth at bedtime as needed for mild pain (or Fever >/= 101). )  . acyclovir (ZOVIRAX) 400 MG tablet Take 400 mg by mouth 2 (two) times daily.  Marland Kitchen amLODipine (NORVASC) 10 MG tablet Take 10 mg by mouth daily.  Marland Kitchen atorvastatin (LIPITOR) 40 MG tablet Take 20 mg by mouth daily.   Marland Kitchen glipiZIDE (GLUCOTROL) 5 MG tablet Take 5 mg by mouth 2 (two) times daily before a meal.  . levothyroxine (SYNTHROID, LEVOTHROID) 100 MCG tablet Take 100 mcg by mouth daily before breakfast.  . losartan (COZAAR) 50 MG tablet Take 50 mg by mouth daily.   . metFORMIN (GLUCOPHAGE) 1000 MG tablet Take 1,000 mg by mouth 2 (two) times daily with a meal.  . pantoprazole (PROTONIX) 40 MG tablet Take 1 tablet (40 mg total) by mouth daily before breakfast.  . sertraline (ZOLOFT) 100 MG tablet Take 100 mg by mouth 2 (two) times daily.  Marland Kitchen trifluridine (VIROPTIC) 1 % ophthalmic  solution Place 1 drop into the left eye as needed (eye ulcer flare).  . [DISCONTINUED] hydrochlorothiazide (HYDRODIURIL) 25 MG tablet Take 25 mg by mouth daily.  . [DISCONTINUED] metoCLOPramide (REGLAN) 5 MG tablet Take 1 tablet (5 mg total) by mouth 2 (two) times daily as needed for nausea.  . [DISCONTINUED] ondansetron (ZOFRAN) 4 MG tablet Take 1 tablet (4 mg total) by mouth every 8 (eight) hours as needed for nausea or vomiting. (Patient not taking: Reported on 03/17/2018)  . [DISCONTINUED] prednisoLONE acetate (PRED FORTE) 1 % ophthalmic suspension Place 1 drop into the left eye as needed (eye ulcer flare).   No facility-administered encounter medications on file as of 12/22/2018.      Objective: Blood pressure 124/76, pulse 91, temperature 98.7 F (37.1 C), temperature source Oral, resp. rate 18, height 5\' 7"  (1.702 m), weight 185 lb 4.8 oz (84.1 kg). Patient is alert and in no acute distress. Conjunctiva is pink. Sclera is nonicteric Oropharyngeal mucosa is normal. No neck masses or thyromegaly noted. Cardiac exam with regular rhythm normal S1 and S2. No murmur or gallop noted. Lungs are clear to auscultation. Abdomen;  No LE edema or clubbing noted.   Assessment:  #1.  Nocturnal heartburn.  Patient is not taking PPI on daily basis.  All she might need as some acid suppression at night.  Plan:  Patient can continue pantoprazole  in the morning on as-needed basis but she will take famotidine OTC 20 mg by mouth daily at bedtime.  She should continue [place] for 30 days and call office with progress report.  Thereafter she can use it on as-needed basis. Patient also advised to keep symptom diary in order to find out if certain foods trigger nocturnal heartburn. Office visit in 1 year.

## 2018-12-31 DIAGNOSIS — E119 Type 2 diabetes mellitus without complications: Secondary | ICD-10-CM | POA: Diagnosis not present

## 2018-12-31 DIAGNOSIS — E7800 Pure hypercholesterolemia, unspecified: Secondary | ICD-10-CM | POA: Diagnosis not present

## 2018-12-31 DIAGNOSIS — I10 Essential (primary) hypertension: Secondary | ICD-10-CM | POA: Diagnosis not present

## 2019-01-27 DIAGNOSIS — E7800 Pure hypercholesterolemia, unspecified: Secondary | ICD-10-CM | POA: Diagnosis not present

## 2019-01-27 DIAGNOSIS — I10 Essential (primary) hypertension: Secondary | ICD-10-CM | POA: Diagnosis not present

## 2019-01-27 DIAGNOSIS — E119 Type 2 diabetes mellitus without complications: Secondary | ICD-10-CM | POA: Diagnosis not present

## 2019-02-01 DIAGNOSIS — Z299 Encounter for prophylactic measures, unspecified: Secondary | ICD-10-CM | POA: Diagnosis not present

## 2019-02-01 DIAGNOSIS — I10 Essential (primary) hypertension: Secondary | ICD-10-CM | POA: Diagnosis not present

## 2019-02-01 DIAGNOSIS — Z6828 Body mass index (BMI) 28.0-28.9, adult: Secondary | ICD-10-CM | POA: Diagnosis not present

## 2019-02-01 DIAGNOSIS — R300 Dysuria: Secondary | ICD-10-CM | POA: Diagnosis not present

## 2019-02-01 DIAGNOSIS — E7800 Pure hypercholesterolemia, unspecified: Secondary | ICD-10-CM | POA: Diagnosis not present

## 2019-02-01 DIAGNOSIS — N390 Urinary tract infection, site not specified: Secondary | ICD-10-CM | POA: Diagnosis not present

## 2019-02-05 IMAGING — DX DG CHEST 2V
2 series · 2 of 2 positions shown · non-contrast
Comparison: CT chest 01/31/2018

CLINICAL DATA: Nausea, pale, week

EXAM:
CHEST - 2 VIEW

[chest lat]
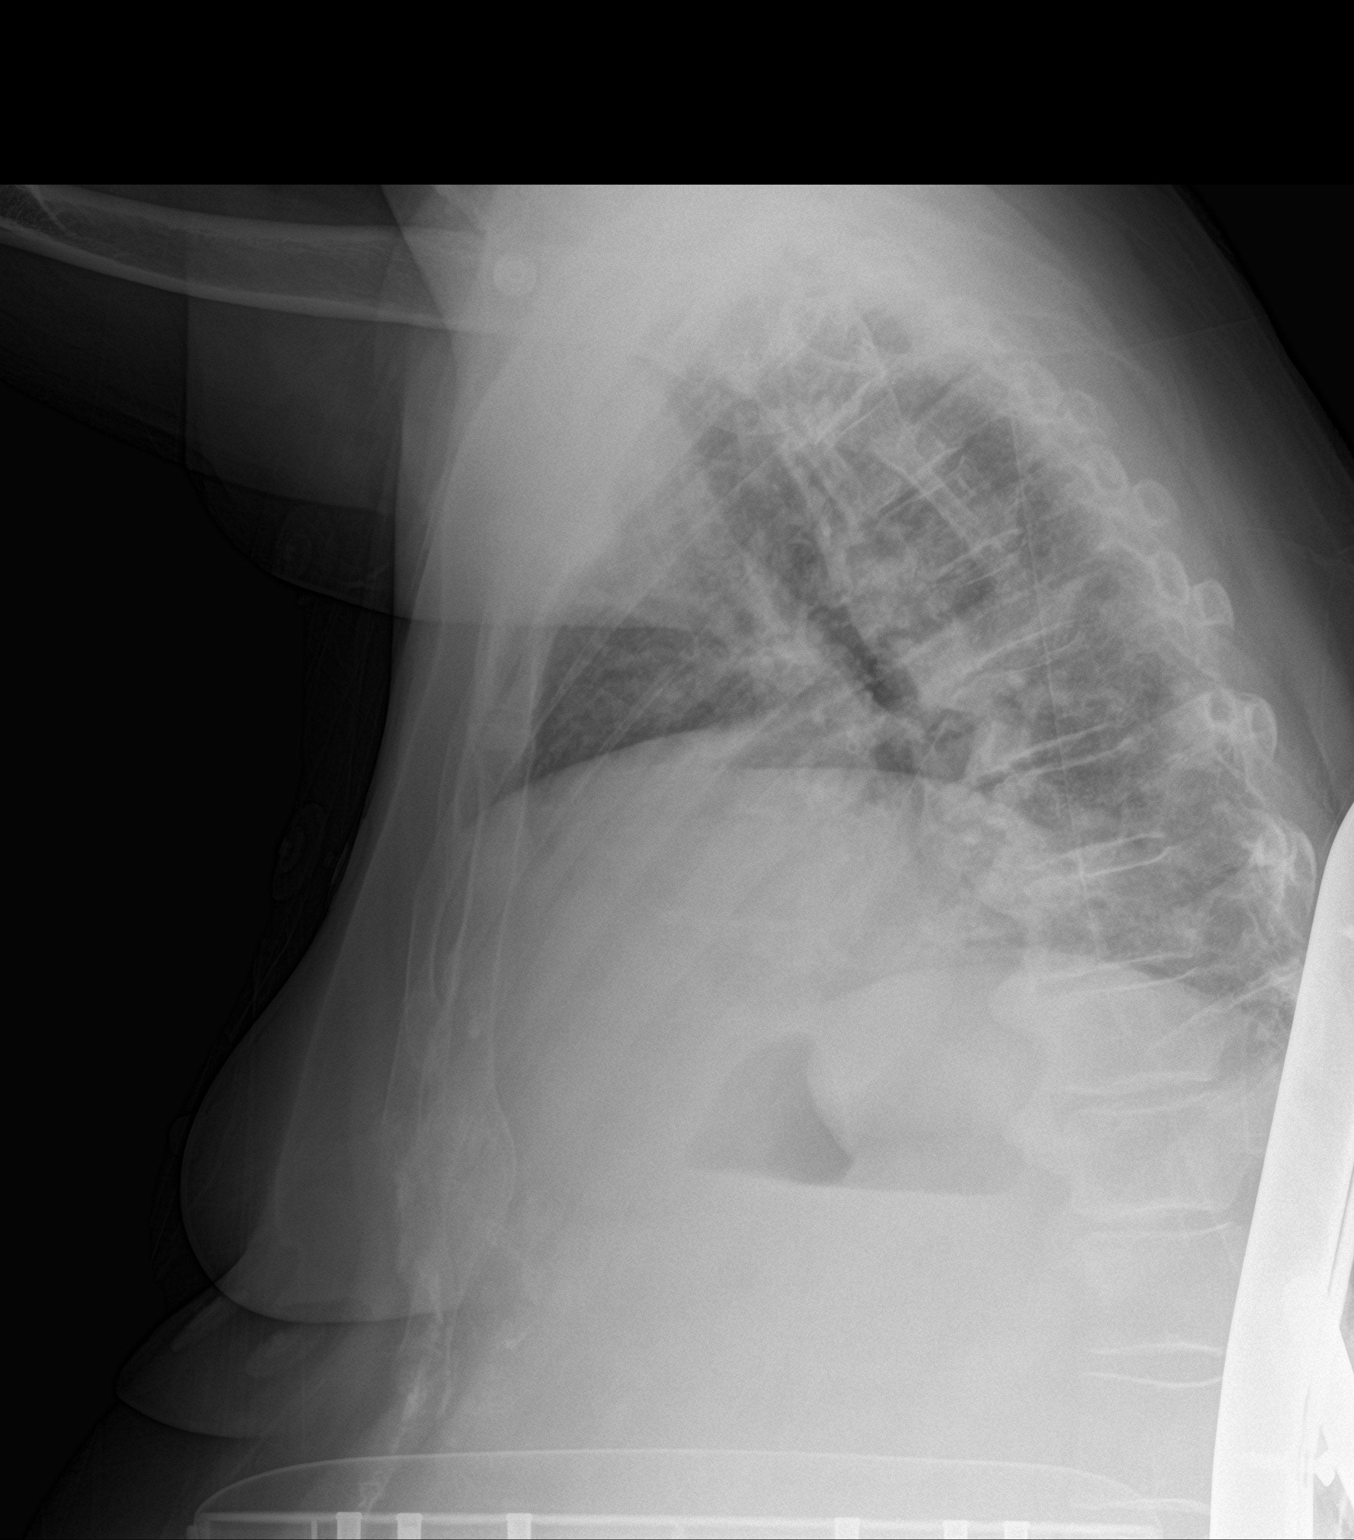

[chest ap]
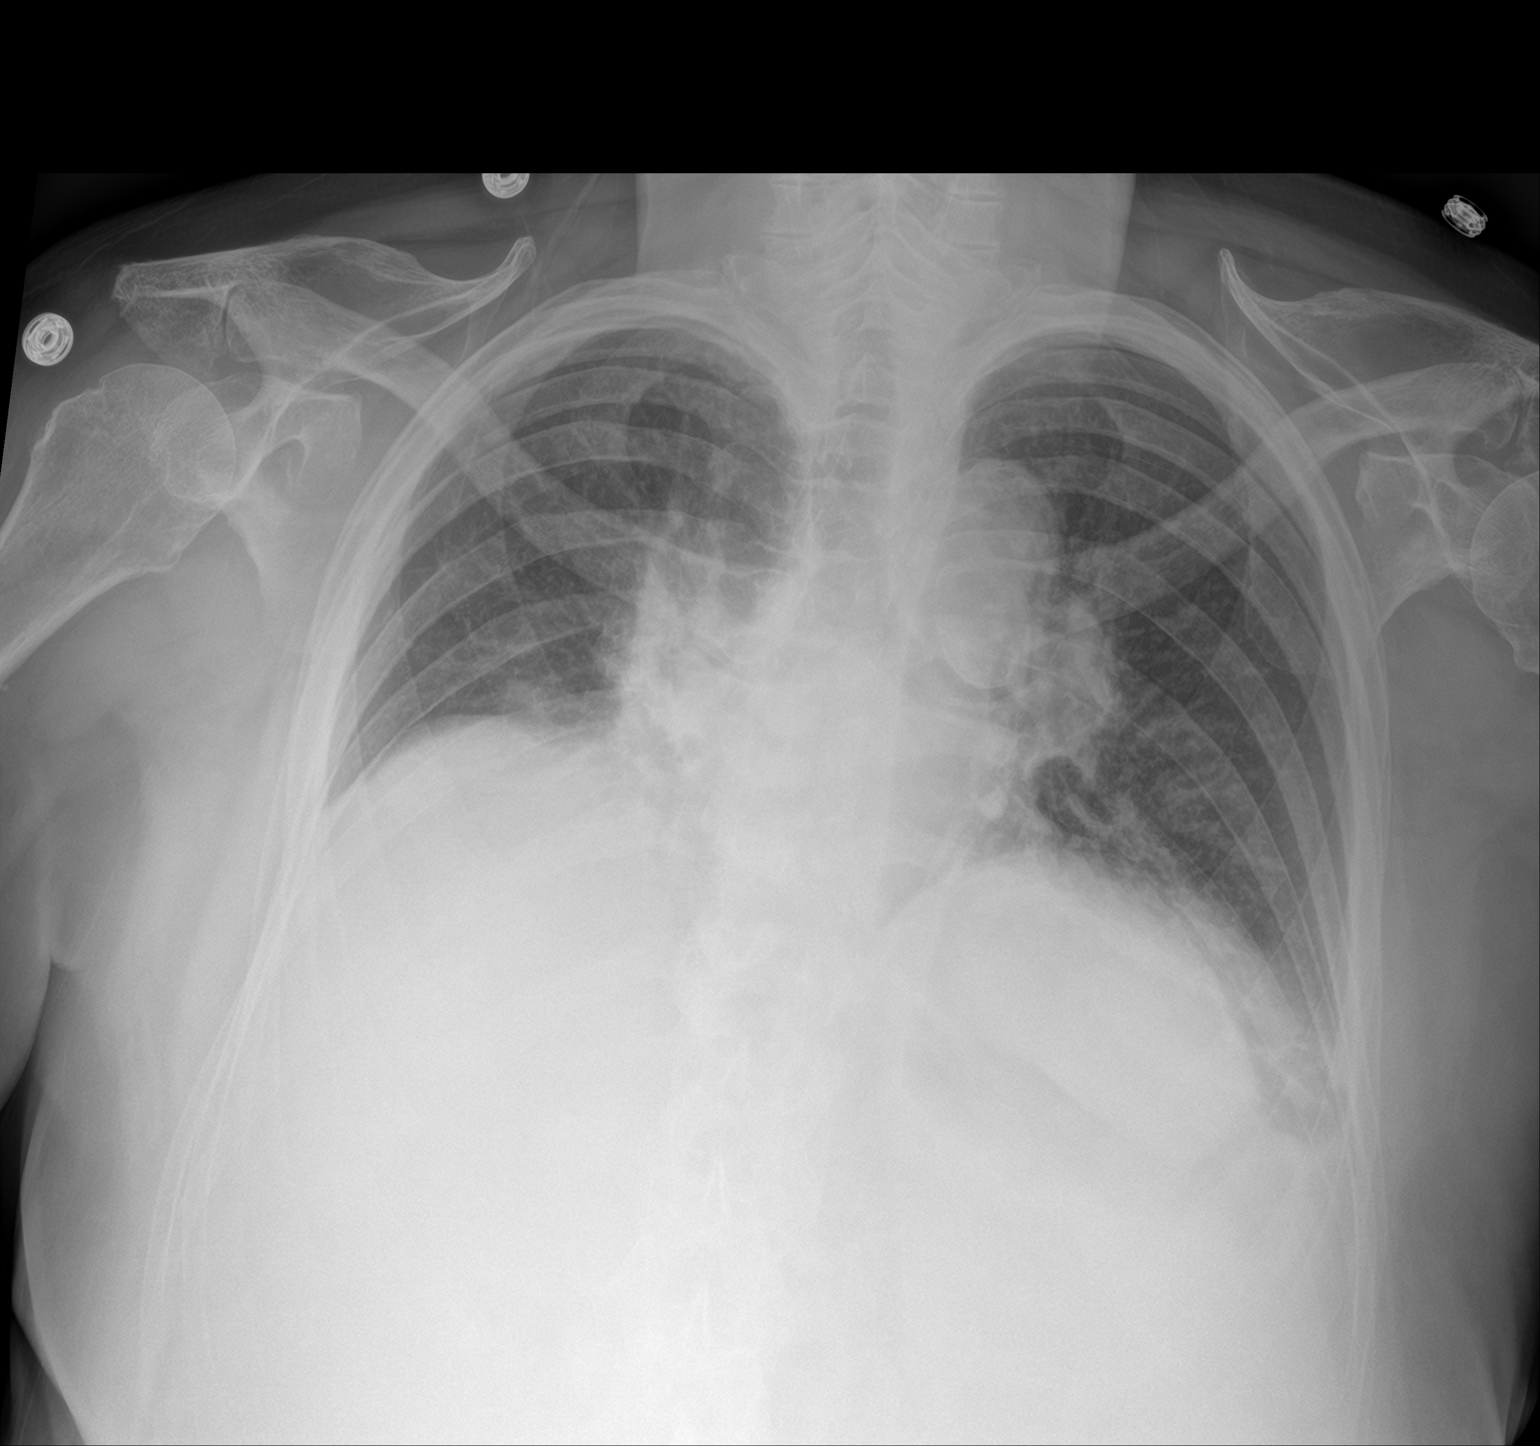

[2 of 2 positions shown; findings below may reference images not displayed]

FINDINGS: Right lower lobe airspace disease which may reflect atelectasis
versus pneumonia. Mild left basilar airspace disease likely
reflecting atelectasis. Chronic elevation of the right diaphragm. No
pneumothorax. Stable cardiomediastinal silhouette. No aggressive
osseous lesion.
IMPRESSION: 1. Right lower lobe airspace disease which may reflect atelectasis
versus pneumonia. Mild left basilar airspace disease likely
reflecting atelectasis.

## 2019-02-24 DIAGNOSIS — I10 Essential (primary) hypertension: Secondary | ICD-10-CM | POA: Diagnosis not present

## 2019-02-24 DIAGNOSIS — E119 Type 2 diabetes mellitus without complications: Secondary | ICD-10-CM | POA: Diagnosis not present

## 2019-02-24 DIAGNOSIS — E7800 Pure hypercholesterolemia, unspecified: Secondary | ICD-10-CM | POA: Diagnosis not present

## 2019-03-04 DIAGNOSIS — Z299 Encounter for prophylactic measures, unspecified: Secondary | ICD-10-CM | POA: Diagnosis not present

## 2019-03-04 DIAGNOSIS — E1165 Type 2 diabetes mellitus with hyperglycemia: Secondary | ICD-10-CM | POA: Diagnosis not present

## 2019-03-04 DIAGNOSIS — Z6828 Body mass index (BMI) 28.0-28.9, adult: Secondary | ICD-10-CM | POA: Diagnosis not present

## 2019-03-04 DIAGNOSIS — I10 Essential (primary) hypertension: Secondary | ICD-10-CM | POA: Diagnosis not present

## 2019-03-04 DIAGNOSIS — E039 Hypothyroidism, unspecified: Secondary | ICD-10-CM | POA: Diagnosis not present

## 2019-03-04 DIAGNOSIS — R079 Chest pain, unspecified: Secondary | ICD-10-CM | POA: Diagnosis not present

## 2019-03-05 DIAGNOSIS — E1165 Type 2 diabetes mellitus with hyperglycemia: Secondary | ICD-10-CM | POA: Diagnosis not present

## 2019-03-05 DIAGNOSIS — E039 Hypothyroidism, unspecified: Secondary | ICD-10-CM | POA: Diagnosis not present

## 2019-03-05 DIAGNOSIS — M1990 Unspecified osteoarthritis, unspecified site: Secondary | ICD-10-CM | POA: Diagnosis not present

## 2019-04-13 DIAGNOSIS — Z1339 Encounter for screening examination for other mental health and behavioral disorders: Secondary | ICD-10-CM | POA: Diagnosis not present

## 2019-04-13 DIAGNOSIS — I10 Essential (primary) hypertension: Secondary | ICD-10-CM | POA: Diagnosis not present

## 2019-04-13 DIAGNOSIS — R5383 Other fatigue: Secondary | ICD-10-CM | POA: Diagnosis not present

## 2019-04-13 DIAGNOSIS — Z1331 Encounter for screening for depression: Secondary | ICD-10-CM | POA: Diagnosis not present

## 2019-04-13 DIAGNOSIS — E7800 Pure hypercholesterolemia, unspecified: Secondary | ICD-10-CM | POA: Diagnosis not present

## 2019-04-13 DIAGNOSIS — Z0000 Encounter for general adult medical examination without abnormal findings: Secondary | ICD-10-CM | POA: Diagnosis not present

## 2019-04-13 DIAGNOSIS — G4733 Obstructive sleep apnea (adult) (pediatric): Secondary | ICD-10-CM | POA: Diagnosis not present

## 2019-04-13 DIAGNOSIS — Z6829 Body mass index (BMI) 29.0-29.9, adult: Secondary | ICD-10-CM | POA: Diagnosis not present

## 2019-04-13 DIAGNOSIS — D509 Iron deficiency anemia, unspecified: Secondary | ICD-10-CM | POA: Diagnosis not present

## 2019-04-13 DIAGNOSIS — E039 Hypothyroidism, unspecified: Secondary | ICD-10-CM | POA: Diagnosis not present

## 2019-04-13 DIAGNOSIS — Z7189 Other specified counseling: Secondary | ICD-10-CM | POA: Diagnosis not present

## 2019-04-13 DIAGNOSIS — Z1211 Encounter for screening for malignant neoplasm of colon: Secondary | ICD-10-CM | POA: Diagnosis not present

## 2019-04-13 DIAGNOSIS — Z299 Encounter for prophylactic measures, unspecified: Secondary | ICD-10-CM | POA: Diagnosis not present

## 2019-04-13 DIAGNOSIS — Z79899 Other long term (current) drug therapy: Secondary | ICD-10-CM | POA: Diagnosis not present

## 2019-04-29 DIAGNOSIS — H2511 Age-related nuclear cataract, right eye: Secondary | ICD-10-CM | POA: Diagnosis not present

## 2019-04-29 DIAGNOSIS — H353131 Nonexudative age-related macular degeneration, bilateral, early dry stage: Secondary | ICD-10-CM | POA: Diagnosis not present

## 2019-04-29 DIAGNOSIS — H1712 Central corneal opacity, left eye: Secondary | ICD-10-CM | POA: Diagnosis not present

## 2019-05-10 DIAGNOSIS — E119 Type 2 diabetes mellitus without complications: Secondary | ICD-10-CM | POA: Diagnosis not present

## 2019-05-10 DIAGNOSIS — I10 Essential (primary) hypertension: Secondary | ICD-10-CM | POA: Diagnosis not present

## 2019-05-10 DIAGNOSIS — E7800 Pure hypercholesterolemia, unspecified: Secondary | ICD-10-CM | POA: Diagnosis not present

## 2019-05-13 ENCOUNTER — Other Ambulatory Visit (HOSPITAL_COMMUNITY): Payer: Self-pay | Admitting: Nurse Practitioner

## 2019-05-13 DIAGNOSIS — IMO0002 Reserved for concepts with insufficient information to code with codable children: Secondary | ICD-10-CM

## 2019-05-21 DIAGNOSIS — M79641 Pain in right hand: Secondary | ICD-10-CM | POA: Diagnosis not present

## 2019-05-21 DIAGNOSIS — M79642 Pain in left hand: Secondary | ICD-10-CM | POA: Diagnosis not present

## 2019-05-21 DIAGNOSIS — I10 Essential (primary) hypertension: Secondary | ICD-10-CM | POA: Diagnosis not present

## 2019-05-21 DIAGNOSIS — Z6829 Body mass index (BMI) 29.0-29.9, adult: Secondary | ICD-10-CM | POA: Diagnosis not present

## 2019-05-21 DIAGNOSIS — Z299 Encounter for prophylactic measures, unspecified: Secondary | ICD-10-CM | POA: Diagnosis not present

## 2019-06-02 DIAGNOSIS — M79641 Pain in right hand: Secondary | ICD-10-CM | POA: Diagnosis not present

## 2019-06-08 ENCOUNTER — Ambulatory Visit (HOSPITAL_COMMUNITY)
Admission: RE | Admit: 2019-06-08 | Discharge: 2019-06-08 | Disposition: A | Discharge status: Home / Self Care | Payer: Medicare Other | Source: Ambulatory Visit | Attending: Nurse Practitioner | Admitting: Nurse Practitioner

## 2019-06-08 ENCOUNTER — Ambulatory Visit (HOSPITAL_COMMUNITY): Admission: RE | Admit: 2019-06-08 | Payer: Medicare Other | Source: Ambulatory Visit

## 2019-06-08 ENCOUNTER — Other Ambulatory Visit: Payer: Self-pay

## 2019-06-08 DIAGNOSIS — N6315 Unspecified lump in the right breast, overlapping quadrants: Secondary | ICD-10-CM | POA: Diagnosis not present

## 2019-06-08 DIAGNOSIS — R928 Other abnormal and inconclusive findings on diagnostic imaging of breast: Secondary | ICD-10-CM | POA: Diagnosis not present

## 2019-06-08 DIAGNOSIS — IMO0002 Reserved for concepts with insufficient information to code with codable children: Secondary | ICD-10-CM

## 2019-06-08 DIAGNOSIS — N6489 Other specified disorders of breast: Secondary | ICD-10-CM | POA: Diagnosis not present

## 2019-06-09 DIAGNOSIS — I10 Essential (primary) hypertension: Secondary | ICD-10-CM | POA: Diagnosis not present

## 2019-06-09 DIAGNOSIS — E119 Type 2 diabetes mellitus without complications: Secondary | ICD-10-CM | POA: Diagnosis not present

## 2019-06-09 DIAGNOSIS — E7800 Pure hypercholesterolemia, unspecified: Secondary | ICD-10-CM | POA: Diagnosis not present

## 2019-06-10 DIAGNOSIS — Z299 Encounter for prophylactic measures, unspecified: Secondary | ICD-10-CM | POA: Diagnosis not present

## 2019-06-10 DIAGNOSIS — I10 Essential (primary) hypertension: Secondary | ICD-10-CM | POA: Diagnosis not present

## 2019-06-10 DIAGNOSIS — E1165 Type 2 diabetes mellitus with hyperglycemia: Secondary | ICD-10-CM | POA: Diagnosis not present

## 2019-06-10 DIAGNOSIS — Z6829 Body mass index (BMI) 29.0-29.9, adult: Secondary | ICD-10-CM | POA: Diagnosis not present

## 2019-06-10 DIAGNOSIS — M069 Rheumatoid arthritis, unspecified: Secondary | ICD-10-CM | POA: Diagnosis not present

## 2019-06-10 DIAGNOSIS — G4733 Obstructive sleep apnea (adult) (pediatric): Secondary | ICD-10-CM | POA: Diagnosis not present

## 2019-06-16 DIAGNOSIS — M79641 Pain in right hand: Secondary | ICD-10-CM | POA: Diagnosis not present

## 2019-06-22 DIAGNOSIS — M25569 Pain in unspecified knee: Secondary | ICD-10-CM | POA: Diagnosis not present

## 2019-06-22 DIAGNOSIS — N183 Chronic kidney disease, stage 3 (moderate): Secondary | ICD-10-CM | POA: Diagnosis not present

## 2019-06-22 DIAGNOSIS — M069 Rheumatoid arthritis, unspecified: Secondary | ICD-10-CM | POA: Diagnosis not present

## 2019-06-22 DIAGNOSIS — I10 Essential (primary) hypertension: Secondary | ICD-10-CM | POA: Diagnosis not present

## 2019-06-22 DIAGNOSIS — Z299 Encounter for prophylactic measures, unspecified: Secondary | ICD-10-CM | POA: Diagnosis not present

## 2019-06-22 DIAGNOSIS — Z6828 Body mass index (BMI) 28.0-28.9, adult: Secondary | ICD-10-CM | POA: Diagnosis not present

## 2019-07-07 DIAGNOSIS — E7800 Pure hypercholesterolemia, unspecified: Secondary | ICD-10-CM | POA: Diagnosis not present

## 2019-07-07 DIAGNOSIS — M069 Rheumatoid arthritis, unspecified: Secondary | ICD-10-CM | POA: Diagnosis not present

## 2019-07-20 DIAGNOSIS — E1165 Type 2 diabetes mellitus with hyperglycemia: Secondary | ICD-10-CM | POA: Diagnosis not present

## 2019-07-20 DIAGNOSIS — Z6828 Body mass index (BMI) 28.0-28.9, adult: Secondary | ICD-10-CM | POA: Diagnosis not present

## 2019-07-20 DIAGNOSIS — Z789 Other specified health status: Secondary | ICD-10-CM | POA: Diagnosis not present

## 2019-07-20 DIAGNOSIS — I10 Essential (primary) hypertension: Secondary | ICD-10-CM | POA: Diagnosis not present

## 2019-07-20 DIAGNOSIS — M069 Rheumatoid arthritis, unspecified: Secondary | ICD-10-CM | POA: Diagnosis not present

## 2019-07-20 DIAGNOSIS — Z299 Encounter for prophylactic measures, unspecified: Secondary | ICD-10-CM | POA: Diagnosis not present

## 2019-07-20 DIAGNOSIS — Z23 Encounter for immunization: Secondary | ICD-10-CM | POA: Diagnosis not present

## 2019-07-20 DIAGNOSIS — M25569 Pain in unspecified knee: Secondary | ICD-10-CM | POA: Diagnosis not present

## 2019-08-06 DIAGNOSIS — E7800 Pure hypercholesterolemia, unspecified: Secondary | ICD-10-CM | POA: Diagnosis not present

## 2019-08-06 DIAGNOSIS — I10 Essential (primary) hypertension: Secondary | ICD-10-CM | POA: Diagnosis not present

## 2019-08-06 DIAGNOSIS — E119 Type 2 diabetes mellitus without complications: Secondary | ICD-10-CM | POA: Diagnosis not present

## 2019-08-30 DIAGNOSIS — E1140 Type 2 diabetes mellitus with diabetic neuropathy, unspecified: Secondary | ICD-10-CM | POA: Diagnosis not present

## 2019-08-30 DIAGNOSIS — M79671 Pain in right foot: Secondary | ICD-10-CM | POA: Diagnosis not present

## 2019-08-30 DIAGNOSIS — I739 Peripheral vascular disease, unspecified: Secondary | ICD-10-CM | POA: Diagnosis not present

## 2019-08-30 DIAGNOSIS — M79672 Pain in left foot: Secondary | ICD-10-CM | POA: Diagnosis not present

## 2019-09-03 DIAGNOSIS — M069 Rheumatoid arthritis, unspecified: Secondary | ICD-10-CM | POA: Diagnosis not present

## 2019-09-13 DIAGNOSIS — M069 Rheumatoid arthritis, unspecified: Secondary | ICD-10-CM | POA: Diagnosis not present

## 2019-09-13 DIAGNOSIS — Z299 Encounter for prophylactic measures, unspecified: Secondary | ICD-10-CM | POA: Diagnosis not present

## 2019-09-13 DIAGNOSIS — I10 Essential (primary) hypertension: Secondary | ICD-10-CM | POA: Diagnosis not present

## 2019-09-13 DIAGNOSIS — M25512 Pain in left shoulder: Secondary | ICD-10-CM | POA: Diagnosis not present

## 2019-09-13 DIAGNOSIS — M25511 Pain in right shoulder: Secondary | ICD-10-CM | POA: Diagnosis not present

## 2019-09-13 DIAGNOSIS — Z6828 Body mass index (BMI) 28.0-28.9, adult: Secondary | ICD-10-CM | POA: Diagnosis not present

## 2019-09-13 DIAGNOSIS — M62838 Other muscle spasm: Secondary | ICD-10-CM | POA: Diagnosis not present

## 2019-09-16 DIAGNOSIS — Z299 Encounter for prophylactic measures, unspecified: Secondary | ICD-10-CM | POA: Diagnosis not present

## 2019-09-16 DIAGNOSIS — E1165 Type 2 diabetes mellitus with hyperglycemia: Secondary | ICD-10-CM | POA: Diagnosis not present

## 2019-09-16 DIAGNOSIS — Z6828 Body mass index (BMI) 28.0-28.9, adult: Secondary | ICD-10-CM | POA: Diagnosis not present

## 2019-09-16 DIAGNOSIS — E039 Hypothyroidism, unspecified: Secondary | ICD-10-CM | POA: Diagnosis not present

## 2019-09-16 DIAGNOSIS — I10 Essential (primary) hypertension: Secondary | ICD-10-CM | POA: Diagnosis not present

## 2019-09-16 DIAGNOSIS — M069 Rheumatoid arthritis, unspecified: Secondary | ICD-10-CM | POA: Diagnosis not present

## 2019-11-16 DIAGNOSIS — Z23 Encounter for immunization: Secondary | ICD-10-CM | POA: Diagnosis not present

## 2019-11-25 DIAGNOSIS — M79671 Pain in right foot: Secondary | ICD-10-CM | POA: Diagnosis not present

## 2019-11-25 DIAGNOSIS — I739 Peripheral vascular disease, unspecified: Secondary | ICD-10-CM | POA: Diagnosis not present

## 2019-11-25 DIAGNOSIS — E1140 Type 2 diabetes mellitus with diabetic neuropathy, unspecified: Secondary | ICD-10-CM | POA: Diagnosis not present

## 2019-11-25 DIAGNOSIS — M79672 Pain in left foot: Secondary | ICD-10-CM | POA: Diagnosis not present

## 2019-12-06 DIAGNOSIS — H43812 Vitreous degeneration, left eye: Secondary | ICD-10-CM | POA: Diagnosis not present

## 2019-12-06 DIAGNOSIS — H2511 Age-related nuclear cataract, right eye: Secondary | ICD-10-CM | POA: Diagnosis not present

## 2019-12-06 DIAGNOSIS — H353131 Nonexudative age-related macular degeneration, bilateral, early dry stage: Secondary | ICD-10-CM | POA: Diagnosis not present

## 2019-12-17 DIAGNOSIS — Z299 Encounter for prophylactic measures, unspecified: Secondary | ICD-10-CM | POA: Diagnosis not present

## 2019-12-17 DIAGNOSIS — Z6827 Body mass index (BMI) 27.0-27.9, adult: Secondary | ICD-10-CM | POA: Diagnosis not present

## 2019-12-17 DIAGNOSIS — M25561 Pain in right knee: Secondary | ICD-10-CM | POA: Diagnosis not present

## 2019-12-17 DIAGNOSIS — M069 Rheumatoid arthritis, unspecified: Secondary | ICD-10-CM | POA: Diagnosis not present

## 2019-12-17 DIAGNOSIS — D649 Anemia, unspecified: Secondary | ICD-10-CM | POA: Diagnosis not present

## 2019-12-17 DIAGNOSIS — D849 Immunodeficiency, unspecified: Secondary | ICD-10-CM | POA: Diagnosis not present

## 2019-12-17 DIAGNOSIS — I10 Essential (primary) hypertension: Secondary | ICD-10-CM | POA: Diagnosis not present

## 2019-12-21 DIAGNOSIS — Z789 Other specified health status: Secondary | ICD-10-CM | POA: Diagnosis not present

## 2019-12-21 DIAGNOSIS — E1165 Type 2 diabetes mellitus with hyperglycemia: Secondary | ICD-10-CM | POA: Diagnosis not present

## 2019-12-21 DIAGNOSIS — N183 Chronic kidney disease, stage 3 unspecified: Secondary | ICD-10-CM | POA: Diagnosis not present

## 2019-12-21 DIAGNOSIS — E1122 Type 2 diabetes mellitus with diabetic chronic kidney disease: Secondary | ICD-10-CM | POA: Diagnosis not present

## 2019-12-21 DIAGNOSIS — M069 Rheumatoid arthritis, unspecified: Secondary | ICD-10-CM | POA: Diagnosis not present

## 2019-12-21 DIAGNOSIS — I10 Essential (primary) hypertension: Secondary | ICD-10-CM | POA: Diagnosis not present

## 2019-12-21 DIAGNOSIS — Z6827 Body mass index (BMI) 27.0-27.9, adult: Secondary | ICD-10-CM | POA: Diagnosis not present

## 2019-12-21 DIAGNOSIS — E7800 Pure hypercholesterolemia, unspecified: Secondary | ICD-10-CM | POA: Diagnosis not present

## 2019-12-21 DIAGNOSIS — Z299 Encounter for prophylactic measures, unspecified: Secondary | ICD-10-CM | POA: Diagnosis not present

## 2019-12-27 DIAGNOSIS — Z23 Encounter for immunization: Secondary | ICD-10-CM | POA: Diagnosis not present

## 2019-12-28 ENCOUNTER — Other Ambulatory Visit (INDEPENDENT_AMBULATORY_CARE_PROVIDER_SITE_OTHER): Payer: Self-pay | Admitting: *Deleted

## 2019-12-28 ENCOUNTER — Other Ambulatory Visit: Payer: Self-pay

## 2019-12-28 ENCOUNTER — Encounter (INDEPENDENT_AMBULATORY_CARE_PROVIDER_SITE_OTHER): Payer: Self-pay | Admitting: Internal Medicine

## 2019-12-28 ENCOUNTER — Ambulatory Visit (INDEPENDENT_AMBULATORY_CARE_PROVIDER_SITE_OTHER): Payer: Medicare Other | Admitting: Internal Medicine

## 2019-12-28 VITALS — BP 127/72 | HR 102 | Temp 97.2°F | Ht 67.0 in | Wt 177.3 lb

## 2019-12-28 DIAGNOSIS — R112 Nausea with vomiting, unspecified: Secondary | ICD-10-CM

## 2019-12-28 DIAGNOSIS — K219 Gastro-esophageal reflux disease without esophagitis: Secondary | ICD-10-CM

## 2019-12-28 MED ORDER — PANTOPRAZOLE SODIUM 40 MG PO TBEC: 40.0000 mg | DELAYED_RELEASE_TABLET | Freq: Every day | ORAL | 1 refills | Status: DC

## 2019-12-28 NOTE — Patient Instructions (Signed)
Keep symptom diary.  If you have the spells he may want to write down the names of foods that you eat in the preceding 24 hours. Please call office with progress report in 3 months.

## 2019-12-28 NOTE — Progress Notes (Signed)
Presenting complaint;  Nausea and vomiting.  Database and subjective:  Patient is 69 year old Caucasian female who is here for scheduled visit.  When she was last seen 1 year ago she was doing well.  She says nausea and vomiting has recurred but it does not occur very often.  She has episode once every 2 months or so lasting for a day or 2.  She also has burping and intermittent heartburn usually at night.  She says she can go weeks and not have any problems and she has not been able to figure out what triggers the symptoms.  Prior to gallbladder surgery she was having these symptoms very often.  Her gallbladder surgery was in Mar 2019. Last episode was 3 weeks ago.  She has lost 8 lbs. but she says weight loss is voluntary. Patient is not taking pantoprazole or famotidine as before.  She is not sure why she stopped these medications and no wonders if this is the reason she is having the symptoms. No history of hematemesis melena or rectal bleeding. Patient says she has received both doses of Covid-19 vaccine and did not have any significant side effects.  She is scheduled to have right cataract extraction next week.   Current Medications: Outpatient Encounter Medications as of 12/28/2019  Medication Sig  . acyclovir (ZOVIRAX) 400 MG tablet Take 400 mg by mouth 2 (two) times daily.  Marland Kitchen amLODipine (NORVASC) 10 MG tablet Take 10 mg by mouth daily.  Marland Kitchen atorvastatin (LIPITOR) 40 MG tablet Take 40 mg by mouth daily.   . folic acid (FOLVITE) 1 MG tablet Take 1 mg by mouth in the morning and at bedtime.  Marland Kitchen glipiZIDE (GLUCOTROL) 5 MG tablet Take 5 mg by mouth 2 (two) times daily before a meal.  . hydrochlorothiazide (HYDRODIURIL) 25 MG tablet Take 25 mg by mouth daily.   Marland Kitchen levothyroxine (SYNTHROID, LEVOTHROID) 100 MCG tablet Take 100 mcg by mouth daily before breakfast.  . losartan (COZAAR) 50 MG tablet Take 50 mg by mouth daily.   Marland Kitchen lovastatin (MEVACOR) 40 MG tablet Take 40 mg by mouth in the morning  and at bedtime.   . metFORMIN (GLUCOPHAGE) 1000 MG tablet Take 1,000 mg by mouth 2 (two) times daily with a meal.  . methotrexate 2.5 MG tablet Take 2.5 mg by mouth once a week. Caution:Chemotherapy. Protect from light.  Patient will resume taking in 2 weeks due to having just taken the Covid # 2 vaccine. She will take 6 bu mouth on Mondays.  . Multiple Vitamin (MULTIVITAMIN ADULT PO) Take by mouth daily. Patient will take 1/2 in the morning and 1/2 in the evening.  . pantoprazole (PROTONIX) 40 MG tablet Take 1 tablet (40 mg total) by mouth daily as needed.  . sertraline (ZOLOFT) 100 MG tablet Take 100 mg by mouth 2 (two) times daily.  Marland Kitchen trifluridine (VIROPTIC) 1 % ophthalmic solution Place 1 drop into the left eye as needed (eye ulcer flare).  . famotidine (PEPCID) 20 MG tablet Take 1 tablet (20 mg total) by mouth at bedtime. (Patient not taking: Reported on 12/28/2019)  . [DISCONTINUED] acetaminophen (TYLENOL) 325 MG tablet Take 2 tablets (650 mg total) by mouth every 6 (six) hours as needed for mild pain (or Fever >/= 101). (Patient not taking: Reported on 12/28/2019)   No facility-administered encounter medications on file as of 12/28/2019.     Objective: Blood pressure 127/72, pulse (!) 102, temperature (!) 97.2 F (36.2 C), temperature source Temporal, height 5\' 7"  (  1.702 m), weight 177 lb 4.8 oz (80.4 kg). Patient is alert and in no acute distress. She is not wearing facial mask. Conjunctiva is pink. Sclera is nonicteric Oropharyngeal mucosa is normal. No neck masses or thyromegaly noted. Cardiac exam with regular rhythm normal S1 and S2. No murmur or gallop noted. Lungs are clear to auscultation. Abdomen is soft and nontender.  Spleen tip is palpable.  Liver is not enlarged. No LE edema or clubbing noted.   Assessment:  #1.  Nausea and vomiting.  Prior to her gallbladder surgery she was having symptoms on frequent basis.  Since her gallbladder surgery of Mar 2019 she had done  well on PPI and famotidine but I noticed she has stopped these medications and I suspect that is the reason her symptoms of relapse although not to that severity. She also had gastric emptying study prior to gallbladder surgery and was within normal limits.  She needs to go back on PPI and H2 B and see how she does.  If she does not respond then would consider further work-up.  Plan:  Patient advised to go back on pantoprazole 40 mg by mouth 30 min before breakfast daily and famotidine OTC 20 mg by mouth daily at bedtime. She will keep symptom diary and call with progress report in 3 months. Office visit in 1 year.

## 2019-12-29 NOTE — H&P (Signed)
Surgical History & Physical  Patient Name: Katherine Bell DOB: 12/16/1950  Surgery: Cataract extraction with intraocular lens implant phacoemulsification; Right Eye  Surgeon: Baruch Goldmann MD Surgery Date:  01/07/2020 Pre-Op Date:  12/27/2019  HPI: A 12 Yr. old female patient c/o diff reading even with her readers. 1. The patient is returning for an AMD 6 months follow-up of both eyes with MAC OCT. Since the last visit, the left eye is doing well. The patient's vision is stable in the left eye. Patient is following medication instructions, AREDS 2 PO BID, Acyclovir PO. The patient does complain that the vision in the right eye has gotten much worse. She requires a significant amount of light to do her activities of daily living. This is negatively affecting the patient's quality of life. She is interested in cataract surgery to improve vision. Pt denies eye discomfort, redness,  Medical History: Cataracts ARMD, Herpes Keratitis, Corneal ulcer, Hyperopia, Presbyopia Anxiety, Depression Arthritis Diabetes High Blood Pressure LDL Lung Problems Thyroid Problems  Review of Systems Negative Allergic/Immunologic Negative Cardiovascular Negative Constitutional Negative Ear, Nose, Mouth & Throat Negative Endocrine Negative Eyes Negative Gastrointestinal Negative Genitourinary Negative Hemotologic/Lymphatic Negative Integumentary Negative Musculoskeletal Negative Neurological Negative Psychiatry Negative Respiratory  Social   Never smoked    Medication Prednisolone acetate 1%, Acyclovir,  Losartan-Hydrochlorothiazide, Metformin, Glipizide, Atorvastatin, Sertraline, Pantoprazole, Ventolin, Calcium Supplement, Acyclovir, Levothyroxine Sodium, Norvasc, Folic acid, Methotrexate, Areds Vitamin,   Sx/Procedures Cataract Surgery,  Hystectomy, Bladder Sling, Knee Replacement,   Drug Allergies  Sulfa (sulfonamide antibiotics), Penicillin,   History & Physical: Heent:  Cataract, Right  eye NECK: supple without bruits LUNGS: lungs clear to auscultation CV: regular rate and rhythm Abdomen: soft and non-tender  Impression & Plan: Assessment: 1.  NUCLEAR SCLEROSIS AGE RELATED; Right Eye (H25.11) 2.  HERPES SIMPLEX KERATITIS (B00.52) 3.  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  CORNEAL OPACITY CENTRAL; Left Eye (H17.12) 5.  VITREOUS DETACHMENT PVD; Left Eye [id number]  Plan: 1.  Cataract accounts for the patient's decreased vision. This visual impairment is not correctable with a tolerable change in glasses or contact lenses. Cataract surgery with an implantation of a new lens should significantly improve the visual and functional status of the patient. Discussed all risks, benefits, alternatives, and potential complications. Discussed the procedures and recovery. Patient desires to have surgery. A-scan ordered and performed today for intra-ocular lens calculations. The surgery will be performed in order to improve vision for driving, reading, and for eye examinations. Recommend phacoemulsification with intra-ocular lens. Surgery required to correct imbalance of vision. Dilates well - shugarcaine by protocol. Right Eye. 2.  Chronic. Interstitial Continue Pred Acetate PRN Continue Acyclovir 452m PO BID. Call with any worsening vision, pain, or any other concerns. Start PF tears 2-3x/day. 3.  Stable. OCT stable today. long discussion about ARMD. Continue AREDS 2. Continue Amsler grid testing weekly. Call with any worsening vision, pain, or any other concerns. 4.  Secondary to herpesviral keratitis. 5.  Old Asymptomatic. RD precautions given. Patient to call with increase in flashing lights/floaters/dark curtain. Symptomatic.

## 2020-01-03 DIAGNOSIS — H2511 Age-related nuclear cataract, right eye: Secondary | ICD-10-CM | POA: Diagnosis not present

## 2020-01-05 ENCOUNTER — Encounter (HOSPITAL_COMMUNITY): Payer: Self-pay

## 2020-01-05 ENCOUNTER — Other Ambulatory Visit (HOSPITAL_COMMUNITY)
Admission: RE | Admit: 2020-01-05 | Discharge: 2020-01-05 | Disposition: A | Discharge status: Home / Self Care | Payer: Medicare Other | Source: Ambulatory Visit | Attending: Ophthalmology | Admitting: Ophthalmology

## 2020-01-05 ENCOUNTER — Other Ambulatory Visit: Payer: Self-pay

## 2020-01-05 ENCOUNTER — Encounter (HOSPITAL_COMMUNITY)
Admission: RE | Admit: 2020-01-05 | Discharge: 2020-01-05 | Disposition: A | Discharge status: Home / Self Care | Payer: Medicare Other | Source: Ambulatory Visit | Attending: Ophthalmology | Admitting: Ophthalmology

## 2020-01-05 DIAGNOSIS — Z20822 Contact with and (suspected) exposure to covid-19: Secondary | ICD-10-CM | POA: Diagnosis not present

## 2020-01-05 DIAGNOSIS — Z01818 Encounter for other preprocedural examination: Secondary | ICD-10-CM | POA: Insufficient documentation

## 2020-01-05 DIAGNOSIS — R9431 Abnormal electrocardiogram [ECG] [EKG]: Secondary | ICD-10-CM | POA: Diagnosis not present

## 2020-01-05 LAB — BASIC METABOLIC PANEL
Anion gap: 12 (ref 5–15)
BUN: 17 mg/dL (ref 8–23)
CO2: 25 mmol/L (ref 22–32)
Calcium: 9.8 mg/dL (ref 8.9–10.3)
Chloride: 101 mmol/L (ref 98–111)
Creatinine, Ser: 0.97 mg/dL (ref 0.44–1.00)
GFR calc Af Amer: >60 mL/min (ref >60)
GFR calc non Af Amer: 59 mL/min — ABNORMAL LOW (ref >60)
Glucose, Bld: 226 mg/dL — ABNORMAL HIGH (ref 70–99)
Potassium: 4.4 mmol/L (ref 3.5–5.1)
Sodium: 138 mmol/L (ref 135–145)

## 2020-01-05 LAB — HEMOGLOBIN A1C
Hgb A1c MFr Bld: 6.5 % — ABNORMAL HIGH (ref 4.8–5.6)
Mean Plasma Glucose: 139.85 mg/dL

## 2020-01-05 LAB — SARS CORONAVIRUS 2 (TAT 6-24 HRS): SARS Coronavirus 2: NEGATIVE

## 2020-01-05 NOTE — Patient Instructions (Signed)
Katherine Bell  01/05/2020     @PREFPERIOPPHARMACY @   Your procedure is scheduled on  01/07/2020  Report to [facility] at  [phone]  A.M.  Call this number if you have problems the morning of surgery:  [phone]   Remember:  Do not eat or drink after midnight.                       Take these medicines the morning of surgery with A SIP OF WATER  Acyclovir, amlodipine, levothyroxine, losartan, protonix, zoloft.    Do not wear jewelry, make-up or nail polish.  Do not wear lotions, powders, or perfumes. Please wear deodorant and brush your teeth.  Do not shave 48 hours prior to surgery.  Men may shave face and neck.  Do not bring valuables to the hospital.  [facility] is not responsible for any belongings or valuables.  Contacts, dentures or bridgework may not be worn into surgery.  Leave your suitcase in the car.  After surgery it may be brought to your room.  For patients admitted to the hospital, discharge time will be determined by your treatment team.  Patients discharged the day of surgery will not be allowed to drive home.   Name and phone number of your driver:   family Special instructions:  None  Please read over the following fact sheets that you were given. Anesthesia Post-op Instructions and Care and Recovery After Surgery       Cataract Surgery, Care After This sheet gives you information about how to care for yourself after your procedure. Your health care provider may also give you more specific instructions. If you have problems or questions, contact your health care provider. What can I expect after the procedure? After the procedure, it is common to have:  Itching.  Discomfort.  Fluid discharge.  Sensitivity to light and to touch.  Bruising in or around the eye.  Mild blurred vision. Follow these instructions at home: Eye care   Do not touch or rub your eyes.  Protect your eyes as told by your health care provider. You may be told  to wear a protective eye shield or sunglasses.  Do not put a contact lens into the affected eye or eyes until your health care provider approves.  Keep the area around your eye clean and dry: ? Avoid swimming. ? Do not allow water to hit you directly in the face while showering. ? Keep soap and shampoo out of your eyes.  Check your eye every day for signs of infection. Watch for: ? Redness, swelling, or pain. ? Fluid, blood, or pus. ? Warmth. ? A bad smell. ? Vision that is getting worse. ? Sensitivity that is getting worse. Activity  Do not drive for 24 hours if you were given a sedative during your procedure.  Avoid strenuous activities, such as playing contact sports, for as long as told by your health care provider.  Do not drive or use heavy machinery until your health care provider approves.  Do not bend or lift heavy objects. Bending increases pressure in the eye. You can walk, climb stairs, and do light household chores.  Ask your health care provider when you can return to work. If you work in a dusty environment, you may be advised to wear protective eyewear for a period of time. General instructions  Take or apply over-the-counter and prescription medicines only as told by your health  care provider. This includes eye drops.  Keep all follow-up visits as told by your health care provider. This is important. Contact a health care provider if:  You have increased bruising around your eye.  You have pain that is not helped with medicine.  You have a fever.  You have redness, swelling, or pain in your eye.  You have fluid, blood, or pus coming from your incision.  Your vision gets worse.  Your sensitivity to light gets worse. Get help right away if:  You have sudden loss of vision.  You see flashes of light or spots (floaters).  You have severe eye pain.  You develop nausea or vomiting. Summary  After your procedure, it is common to have itching,  discomfort, bruising, fluid discharge, or sensitivity to light.  Follow instructions from your health care provider about caring for your eye after the procedure.  Do not rub your eye after the procedure. You may need to wear eye protection or sunglasses. Do not wear contact lenses. Keep the area around your eye clean and dry.  Avoid activities that require a lot of effort. These include playing sports and lifting heavy objects.  Contact a health care provider if you have increased bruising, pain that does not go away, or a fever. Get help right away if you suddenly lose your vision, see flashes of light or spots, or have severe pain in the eye. This information is not intended to replace advice given to you by your health care provider. Make sure you discuss any questions you have with your health care provider. Document Revised: 08/17/2019 Document Reviewed: 04/20/2018 Elsevier Patient Education  2020 [place] These instructions provide you with information about caring for yourself after your procedure. Your health care provider may also give you more specific instructions. Your treatment has been planned according to current medical practices, but problems sometimes occur. Call your health care provider if you have any problems or questions after your procedure. What can I expect after the procedure? After your procedure, you may:  Feel sleepy for several hours.  Feel clumsy and have poor balance for several hours.  Feel forgetful about what happened after the procedure.  Have poor judgment for several hours.  Feel nauseous or vomit.  Have a sore throat if you had a breathing tube during the procedure. Follow these instructions at home: For at least 24 hours after the procedure:      Have a responsible adult stay with you. It is important to have someone help care for you until you are awake and alert.  Rest as needed.  Do  not: ? Participate in activities in which you could fall or become injured. ? Drive. ? Use heavy machinery. ? Drink alcohol. ? Take sleeping pills or medicines that cause drowsiness. ? Make important decisions or sign legal documents. ? Take care of children on your own. Eating and drinking  Follow the diet that is recommended by your health care provider.  If you vomit, drink water, juice, or soup when you can drink without vomiting.  Make sure you have little or no nausea before eating solid foods. General instructions  Take over-the-counter and prescription medicines only as told by your health care provider.  If you have sleep apnea, surgery and certain medicines can increase your risk for breathing problems. Follow instructions from your health care provider about wearing your sleep device: ? Anytime you are sleeping, including during daytime naps. ?  While taking prescription pain medicines, sleeping medicines, or medicines that make you drowsy.  If you smoke, do not smoke without supervision.  Keep all follow-up visits as told by your health care provider. This is important. Contact a health care provider if:  You keep feeling nauseous or you keep vomiting.  You feel light-headed.  You develop a rash.  You have a fever. Get help right away if:  You have trouble breathing. Summary  For several hours after your procedure, you may feel sleepy and have poor judgment.  Have a responsible adult stay with you for at least 24 hours or until you are awake and alert. This information is not intended to replace advice given to you by your health care provider. Make sure you discuss any questions you have with your health care provider. Document Revised: 01/19/2018 Document Reviewed: 02/11/2016 Elsevier Patient Education  [place].

## 2020-01-06 NOTE — Pre-Procedure Instructions (Signed)
HgbA1C routed to PCP. 

## 2020-01-07 ENCOUNTER — Encounter (HOSPITAL_COMMUNITY)
Admission: RE | Disposition: A | Discharge status: Home / Self Care | Payer: Self-pay | Source: Home / Self Care | Attending: Ophthalmology

## 2020-01-07 ENCOUNTER — Ambulatory Visit (HOSPITAL_COMMUNITY): Payer: Medicare Other | Admitting: Anesthesiology

## 2020-01-07 ENCOUNTER — Ambulatory Visit (HOSPITAL_COMMUNITY)
Admission: RE | Admit: 2020-01-07 | Discharge: 2020-01-07 | Disposition: A | Discharge status: Home / Self Care | Payer: Medicare Other | Attending: Ophthalmology | Admitting: Ophthalmology

## 2020-01-07 ENCOUNTER — Encounter (HOSPITAL_COMMUNITY): Payer: Self-pay | Admitting: Ophthalmology

## 2020-01-07 DIAGNOSIS — K219 Gastro-esophageal reflux disease without esophagitis: Secondary | ICD-10-CM | POA: Insufficient documentation

## 2020-01-07 DIAGNOSIS — H2511 Age-related nuclear cataract, right eye: Secondary | ICD-10-CM | POA: Diagnosis not present

## 2020-01-07 DIAGNOSIS — Z7984 Long term (current) use of oral hypoglycemic drugs: Secondary | ICD-10-CM | POA: Diagnosis not present

## 2020-01-07 DIAGNOSIS — F419 Anxiety disorder, unspecified: Secondary | ICD-10-CM | POA: Insufficient documentation

## 2020-01-07 DIAGNOSIS — F329 Major depressive disorder, single episode, unspecified: Secondary | ICD-10-CM | POA: Insufficient documentation

## 2020-01-07 DIAGNOSIS — M1990 Unspecified osteoarthritis, unspecified site: Secondary | ICD-10-CM | POA: Insufficient documentation

## 2020-01-07 DIAGNOSIS — Z882 Allergy status to sulfonamides status: Secondary | ICD-10-CM | POA: Insufficient documentation

## 2020-01-07 DIAGNOSIS — E1136 Type 2 diabetes mellitus with diabetic cataract: Secondary | ICD-10-CM | POA: Diagnosis not present

## 2020-01-07 DIAGNOSIS — I10 Essential (primary) hypertension: Secondary | ICD-10-CM | POA: Insufficient documentation

## 2020-01-07 DIAGNOSIS — Z880 Allergy status to penicillin: Secondary | ICD-10-CM | POA: Insufficient documentation

## 2020-01-07 DIAGNOSIS — B0052 Herpesviral keratitis: Secondary | ICD-10-CM | POA: Diagnosis not present

## 2020-01-07 DIAGNOSIS — E039 Hypothyroidism, unspecified: Secondary | ICD-10-CM | POA: Diagnosis not present

## 2020-01-07 DIAGNOSIS — Z79899 Other long term (current) drug therapy: Secondary | ICD-10-CM | POA: Insufficient documentation

## 2020-01-07 HISTORY — PX: CATARACT EXTRACTION W/PHACO: SHX586

## 2020-01-07 LAB — GLUCOSE, CAPILLARY: Glucose-Capillary: 162 mg/dL — ABNORMAL HIGH (ref 70–99)

## 2020-01-07 SURGERY — PHACOEMULSIFICATION, CATARACT, WITH IOL INSERTION
Anesthesia: Monitor Anesthesia Care | Site: Eye | Laterality: Right

## 2020-01-07 MED ORDER — EPINEPHRINE PF 1 MG/ML IJ SOLN
INTRAMUSCULAR | Status: AC
  Filled 2020-01-07: qty 2

## 2020-01-07 MED ORDER — TETRACAINE HCL 0.5 % OP SOLN
1.0000 [drp] | OPHTHALMIC | Status: AC | PRN
  Administered 2020-01-07 (×3): 1 [drp] via OPHTHALMIC

## 2020-01-07 MED ORDER — BSS IO SOLN
INTRAOCULAR | Status: DC | PRN
  Administered 2020-01-07: 15 mL via INTRAOCULAR

## 2020-01-07 MED ORDER — EPINEPHRINE PF 1 MG/ML IJ SOLN
INTRAOCULAR | Status: DC | PRN
  Administered 2020-01-07: 09:00:00 500 mL

## 2020-01-07 MED ORDER — POVIDONE-IODINE 5 % OP SOLN
OPHTHALMIC | Status: DC | PRN
  Administered 2020-01-07: 1 via OPHTHALMIC

## 2020-01-07 MED ORDER — MIDAZOLAM HCL 5 MG/5ML IJ SOLN
INTRAMUSCULAR | Status: DC | PRN
  Administered 2020-01-07: 2 mg via INTRAVENOUS

## 2020-01-07 MED ORDER — LIDOCAINE HCL (PF) 1 % IJ SOLN
INTRAOCULAR | Status: DC | PRN
  Administered 2020-01-07: 1 mL via OPHTHALMIC

## 2020-01-07 MED ORDER — CYCLOPENTOLATE-PHENYLEPHRINE 0.2-1 % OP SOLN
1.0000 [drp] | OPHTHALMIC | Status: AC | PRN
  Administered 2020-01-07 (×3): 1 [drp] via OPHTHALMIC

## 2020-01-07 MED ORDER — DEXAMETHASONE 0.4 MG OP INST
VAGINAL_INSERT | OPHTHALMIC | Status: DC | PRN
  Administered 2020-01-07: 0.4 mg via OPHTHALMIC

## 2020-01-07 MED ORDER — LIDOCAINE HCL 3.5 % OP GEL
1.0000 "application " | Freq: Once | OPHTHALMIC | Status: AC
  Administered 2020-01-07: 1 via OPHTHALMIC

## 2020-01-07 MED ORDER — PHENYLEPHRINE HCL 2.5 % OP SOLN
1.0000 [drp] | OPHTHALMIC | Status: AC | PRN
  Administered 2020-01-07 (×3): 1 [drp] via OPHTHALMIC

## 2020-01-07 MED ORDER — PROVISC 10 MG/ML IO SOLN
INTRAOCULAR | Status: DC | PRN
  Administered 2020-01-07: 0.85 mL via INTRAOCULAR

## 2020-01-07 MED ORDER — MIDAZOLAM HCL 2 MG/2ML IJ SOLN
INTRAMUSCULAR | Status: AC
  Filled 2020-01-07: qty 2

## 2020-01-07 MED ORDER — SODIUM HYALURONATE 23 MG/ML IO SOLN
INTRAOCULAR | Status: DC | PRN
  Administered 2020-01-07: 0.6 mL via INTRAOCULAR

## 2020-01-07 SURGICAL SUPPLY — 12 items

## 2020-01-07 NOTE — Interval H&P Note (Signed)
History and Physical Interval Note: The H and P was reviewed and updated. The patient was examined.  No changes were found after exam.  The surgical eye was marked.  01/07/2020 8:26 AM  Katherine Bell  has presented today for surgery, with the diagnosis of Nuclear sclerotic cataract - Right eye.  The various methods of treatment have been discussed with the patient and family. After consideration of risks, benefits and other options for treatment, the patient has consented to  Procedure(s) with comments: CATARACT EXTRACTION PHACO AND INTRAOCULAR LENS PLACEMENT (IOC) (Right) - right as a surgical intervention.  The patient's history has been reviewed, patient examined, no change in status, stable for surgery.  I have reviewed the patient's chart and labs.  Questions were answered to the patient's satisfaction.     Baruch Goldmann

## 2020-01-07 NOTE — Op Note (Signed)
Date of procedure: 01/07/20  Pre-operative diagnosis: Visually significant age-related nuclear cataract, Right Eye (H25.11)  Post-operative diagnosis: Visually significant age-related nuclear cataract, Right Eye  Procedure: Removal of cataract via phacoemulsification and insertion of intra-ocular lens Wynetta Emery and [place]  +22.0D into the capsular bag of the Right Eye Placement of [place], Right Eye  Attending surgeon: Gerda Diss. Samayah Novinger, MD, MA  Anesthesia: MAC, Topical [place]  Complications: None  Estimated Blood Loss: <66m (minimal)  Specimens: None  Implants: As above  Indications:  Visually significant age-related cataract, Right Eye  Procedure:  The patient was seen and identified in the pre-operative area. The operative eye was identified and dilated.  The operative eye was marked.  Topical anesthesia was administered to the operative eye.     The patient was then to the operative suite and placed in the supine position.  A timeout was performed confirming the patient, procedure to be performed, and all other relevant information.   The patient's face was prepped and draped in the usual fashion for intra-ocular surgery.  A lid speculum was placed into the operative eye and the surgical microscope moved into place and focused.  A superotemporal paracentesis was created using a 20 gauge paracentesis blade.  Shugarcaine was injected into the anterior chamber.  Viscoelastic was injected into the anterior chamber.  A temporal clear-corneal main wound incision was created using a 2.486mmicrokeratome.  A continuous curvilinear capsulorrhexis was initiated using an irrigating cystitome and completed using capsulorrhexis forceps.  Hydrodissection and hydrodeliniation were performed.  Viscoelastic was injected into the anterior chamber.  A phacoemulsification handpiece and a chopper as a second instrument were used to remove the nucleus and epinucleus. The  irrigation/aspiration handpiece was used to remove any remaining cortical material.   The capsular bag was reinflated with viscoelastic, checked, and found to be intact.  The intraocular lens was inserted into the capsular bag.  The irrigation/aspiration handpiece was used to remove any remaining viscoelastic.  The clear corneal wound and paracentesis wounds were then hydrated and checked with Weck-Cels to be watertight.  A Dextenza implant was placed in the lower canaliculus.  The lid-speculum and drape was removed, and the patient's face was cleaned with a wet and dry 4x4.   The patient was taken to the post-operative care unit in good condition, having tolerated the procedure well.  Post-Op Instructions: [facility] and will receive all other orders and instructions.

## 2020-01-07 NOTE — Anesthesia Postprocedure Evaluation (Signed)
Anesthesia Post Note  Patient: Katherine Bell  Procedure(s) Performed: CATARACT EXTRACTION PHACO AND INTRAOCULAR LENS PLACEMENT ([place]) (Right Eye)  Patient location during evaluation: PACU Anesthesia Type: MAC Level of consciousness: awake and alert and oriented Pain management: pain level controlled Vital Signs Assessment: post-procedure vital signs reviewed and stable Respiratory status: spontaneous breathing Cardiovascular status: blood pressure returned to baseline and stable Postop Assessment: no apparent nausea or vomiting Anesthetic complications: no     Last Vitals:  Vitals:   01/07/20 0749  BP: 136/76  Resp: 16  Temp: 36.8 C  SpO2: 96%    Last Pain:  Vitals:   01/07/20 0749  TempSrc: Oral  PainSc: 0-No pain                 Song Garris

## 2020-01-07 NOTE — Transfer of Care (Signed)
Immediate Anesthesia Transfer of Care Note  Patient: Katherine Bell  Procedure(s) Performed: CATARACT EXTRACTION PHACO AND INTRAOCULAR LENS PLACEMENT ([facility]) (Right Eye)  Patient Location: Short Stay  Anesthesia Type:MAC  Level of Consciousness: awake  Airway & Oxygen Therapy: Patient Spontanous Breathing  Post-op Assessment: Report given to RN  Post vital signs: Reviewed  Last Vitals:  Vitals Value Taken Time  BP    Temp    Pulse    Resp    SpO2      Last Pain:  Vitals:   01/07/20 0749  TempSrc: Oral  PainSc: 0-No pain      Patients Stated Pain Goal: 9 (26/37/85 [id number])  Complications: No apparent anesthesia complications

## 2020-01-07 NOTE — Discharge Instructions (Signed)
Please discharge patient when stable, will follow up today with Dr. Coy Vandoren at the [facility] [place] office immediately following discharge.  Leave shield in place until visit.  All paperwork with discharge instructions will be given at the office.  [facility] Address:  [address]  [place], [place] [address]             Monitored Anesthesia Care, Care After These instructions provide you with information about caring for yourself after your procedure. Your health care provider may also give you more specific instructions. Your treatment has been planned according to current medical practices, but problems sometimes occur. Call your health care provider if you have any problems or questions after your procedure. What can I expect after the procedure? After your procedure, you may:  Feel sleepy for several hours.  Feel clumsy and have poor balance for several hours.  Feel forgetful about what happened after the procedure.  Have poor judgment for several hours.  Feel nauseous or vomit.  Have a sore throat if you had a breathing tube during the procedure. Follow these instructions at home: For at least 24 hours after the procedure:      Have a responsible adult stay with you. It is important to have someone help care for you until you are awake and alert.  Rest as needed.  Do not: ? Participate in activities in which you could fall or become injured. ? Drive. ? Use heavy machinery. ? Drink alcohol. ? Take sleeping pills or medicines that cause drowsiness. ? Make important decisions or sign legal documents. ? Take care of children on your own. Eating and drinking  Follow the diet that is recommended by your health care provider.  If you vomit, drink water, juice, or soup when you can drink without vomiting.  Make sure you have little or no nausea before eating solid foods. General instructions  Take over-the-counter and  prescription medicines only as told by your health care provider.  If you have sleep apnea, surgery and certain medicines can increase your risk for breathing problems. Follow instructions from your health care provider about wearing your sleep device: ? Anytime you are sleeping, including during daytime naps. ? While taking prescription pain medicines, sleeping medicines, or medicines that make you drowsy.  If you smoke, do not smoke without supervision.  Keep all follow-up visits as told by your health care provider. This is important. Contact a health care provider if:  You keep feeling nauseous or you keep vomiting.  You feel light-headed.  You develop a rash.  You have a fever. Get help right away if:  You have trouble breathing. Summary  For several hours after your procedure, you may feel sleepy and have poor judgment.  Have a responsible adult stay with you for at least 24 hours or until you are awake and alert. This information is not intended to replace advice given to you by your health care provider. Make sure you discuss any questions you have with your health care provider. Document Revised: 01/19/2018 Document Reviewed: 02/11/2016 Elsevier Patient Education  2020 [employer]  

## 2020-01-07 NOTE — Anesthesia Preprocedure Evaluation (Addendum)
Anesthesia Evaluation  Patient identified by MRN, date of birth, ID band Patient awake    Reviewed: Allergy & Precautions, NPO status , Patient's Chart, lab work & pertinent test results  Airway Mallampati: II  TM Distance: >3 FB Neck ROM: Full    Dental no notable dental hx.    Pulmonary neg sleep apnea,    Pulmonary exam normal breath sounds clear to auscultation       Cardiovascular Exercise Tolerance: Good hypertension, Pt. on medications  Rhythm:Regular Rate:Normal     Neuro/Psych Anxiety    GI/Hepatic GERD  Medicated and Controlled,  Endo/Other  diabetes, Well Controlled, Type 2Hypothyroidism   Renal/GU      Musculoskeletal  (+) Arthritis , Osteoarthritis,    Abdominal   Peds  Hematology  (+) anemia ,   Anesthesia Other Findings   Reproductive/Obstetrics                          Anesthesia Physical Anesthesia Plan  ASA: II  Anesthesia Plan: MAC   Post-op Pain Management:    Induction:   PONV Risk Score and Plan: 1 and Treatment may vary due to age or medical condition  Airway Management Planned: Nasal Cannula and Natural Airway  Additional Equipment:   Intra-op Plan:   Post-operative Plan:   Informed Consent: I have reviewed the patients History and Physical, chart, labs and discussed the procedure including the risks, benefits and alternatives for the proposed anesthesia with the patient or authorized representative who has indicated his/her understanding and acceptance.       Plan Discussed with: CRNA  Anesthesia Plan Comments:         Anesthesia Quick Evaluation

## 2020-01-31 DIAGNOSIS — S52592A Other fractures of lower end of left radius, initial encounter for closed fracture: Secondary | ICD-10-CM | POA: Diagnosis not present

## 2020-01-31 DIAGNOSIS — W1839XA Other fall on same level, initial encounter: Secondary | ICD-10-CM | POA: Diagnosis not present

## 2020-01-31 DIAGNOSIS — Z885 Allergy status to narcotic agent status: Secondary | ICD-10-CM | POA: Diagnosis not present

## 2020-01-31 DIAGNOSIS — Z79899 Other long term (current) drug therapy: Secondary | ICD-10-CM | POA: Diagnosis not present

## 2020-01-31 DIAGNOSIS — Z7984 Long term (current) use of oral hypoglycemic drugs: Secondary | ICD-10-CM | POA: Diagnosis not present

## 2020-01-31 DIAGNOSIS — E039 Hypothyroidism, unspecified: Secondary | ICD-10-CM | POA: Diagnosis not present

## 2020-01-31 DIAGNOSIS — M1990 Unspecified osteoarthritis, unspecified site: Secondary | ICD-10-CM | POA: Diagnosis not present

## 2020-01-31 DIAGNOSIS — K219 Gastro-esophageal reflux disease without esophagitis: Secondary | ICD-10-CM | POA: Diagnosis not present

## 2020-01-31 DIAGNOSIS — F419 Anxiety disorder, unspecified: Secondary | ICD-10-CM | POA: Diagnosis not present

## 2020-01-31 DIAGNOSIS — Z882 Allergy status to sulfonamides status: Secondary | ICD-10-CM | POA: Diagnosis not present

## 2020-01-31 DIAGNOSIS — Z880 Allergy status to penicillin: Secondary | ICD-10-CM | POA: Diagnosis not present

## 2020-01-31 DIAGNOSIS — S59292A Other physeal fracture of lower end of radius, left arm, initial encounter for closed fracture: Secondary | ICD-10-CM | POA: Diagnosis not present

## 2020-02-09 DIAGNOSIS — S52502A Unspecified fracture of the lower end of left radius, initial encounter for closed fracture: Secondary | ICD-10-CM | POA: Diagnosis not present

## 2020-02-17 DIAGNOSIS — M79671 Pain in right foot: Secondary | ICD-10-CM | POA: Diagnosis not present

## 2020-02-17 DIAGNOSIS — E1140 Type 2 diabetes mellitus with diabetic neuropathy, unspecified: Secondary | ICD-10-CM | POA: Diagnosis not present

## 2020-02-17 DIAGNOSIS — I739 Peripheral vascular disease, unspecified: Secondary | ICD-10-CM | POA: Diagnosis not present

## 2020-02-17 DIAGNOSIS — M79672 Pain in left foot: Secondary | ICD-10-CM | POA: Diagnosis not present

## 2020-03-03 DIAGNOSIS — S52502D Unspecified fracture of the lower end of left radius, subsequent encounter for closed fracture with routine healing: Secondary | ICD-10-CM | POA: Diagnosis not present

## 2020-03-13 DIAGNOSIS — B0052 Herpesviral keratitis: Secondary | ICD-10-CM | POA: Diagnosis not present

## 2020-03-13 DIAGNOSIS — H179 Unspecified corneal scar and opacity: Secondary | ICD-10-CM | POA: Diagnosis not present

## 2020-03-13 DIAGNOSIS — Z961 Presence of intraocular lens: Secondary | ICD-10-CM | POA: Diagnosis not present

## 2020-03-20 ENCOUNTER — Ambulatory Visit (INDEPENDENT_AMBULATORY_CARE_PROVIDER_SITE_OTHER): Payer: Medicare Other | Admitting: Internal Medicine

## 2020-03-22 DIAGNOSIS — I10 Essential (primary) hypertension: Secondary | ICD-10-CM | POA: Diagnosis not present

## 2020-03-22 DIAGNOSIS — N183 Chronic kidney disease, stage 3 unspecified: Secondary | ICD-10-CM | POA: Diagnosis not present

## 2020-03-22 DIAGNOSIS — E1165 Type 2 diabetes mellitus with hyperglycemia: Secondary | ICD-10-CM | POA: Diagnosis not present

## 2020-03-22 DIAGNOSIS — M069 Rheumatoid arthritis, unspecified: Secondary | ICD-10-CM | POA: Diagnosis not present

## 2020-03-22 DIAGNOSIS — Z299 Encounter for prophylactic measures, unspecified: Secondary | ICD-10-CM | POA: Diagnosis not present

## 2020-03-22 DIAGNOSIS — E1122 Type 2 diabetes mellitus with diabetic chronic kidney disease: Secondary | ICD-10-CM | POA: Diagnosis not present

## 2020-04-11 DIAGNOSIS — L03032 Cellulitis of left toe: Secondary | ICD-10-CM | POA: Diagnosis not present

## 2020-04-11 DIAGNOSIS — L600 Ingrowing nail: Secondary | ICD-10-CM | POA: Diagnosis not present

## 2020-04-11 DIAGNOSIS — M79672 Pain in left foot: Secondary | ICD-10-CM | POA: Diagnosis not present

## 2020-04-11 DIAGNOSIS — M79675 Pain in left toe(s): Secondary | ICD-10-CM | POA: Diagnosis not present

## 2020-04-14 DIAGNOSIS — I10 Essential (primary) hypertension: Secondary | ICD-10-CM | POA: Diagnosis not present

## 2020-04-14 DIAGNOSIS — R296 Repeated falls: Secondary | ICD-10-CM | POA: Diagnosis not present

## 2020-04-14 DIAGNOSIS — R42 Dizziness and giddiness: Secondary | ICD-10-CM | POA: Diagnosis not present

## 2020-04-14 DIAGNOSIS — Z299 Encounter for prophylactic measures, unspecified: Secondary | ICD-10-CM | POA: Diagnosis not present

## 2020-04-14 DIAGNOSIS — M545 Low back pain: Secondary | ICD-10-CM | POA: Diagnosis not present

## 2020-04-20 DIAGNOSIS — R519 Headache, unspecified: Secondary | ICD-10-CM | POA: Diagnosis not present

## 2020-06-08 DIAGNOSIS — Z1283 Encounter for screening for malignant neoplasm of skin: Secondary | ICD-10-CM | POA: Diagnosis not present

## 2020-06-08 DIAGNOSIS — L570 Actinic keratosis: Secondary | ICD-10-CM | POA: Diagnosis not present

## 2020-06-08 DIAGNOSIS — I781 Nevus, non-neoplastic: Secondary | ICD-10-CM | POA: Diagnosis not present

## 2020-06-08 DIAGNOSIS — D225 Melanocytic nevi of trunk: Secondary | ICD-10-CM | POA: Diagnosis not present

## 2020-06-08 DIAGNOSIS — X32XXXD Exposure to sunlight, subsequent encounter: Secondary | ICD-10-CM | POA: Diagnosis not present

## 2020-06-26 DIAGNOSIS — I10 Essential (primary) hypertension: Secondary | ICD-10-CM | POA: Diagnosis not present

## 2020-06-26 DIAGNOSIS — M069 Rheumatoid arthritis, unspecified: Secondary | ICD-10-CM | POA: Diagnosis not present

## 2020-06-26 DIAGNOSIS — E1122 Type 2 diabetes mellitus with diabetic chronic kidney disease: Secondary | ICD-10-CM | POA: Diagnosis not present

## 2020-06-26 DIAGNOSIS — E1165 Type 2 diabetes mellitus with hyperglycemia: Secondary | ICD-10-CM | POA: Diagnosis not present

## 2020-06-26 DIAGNOSIS — Z299 Encounter for prophylactic measures, unspecified: Secondary | ICD-10-CM | POA: Diagnosis not present

## 2020-06-26 DIAGNOSIS — N183 Chronic kidney disease, stage 3 unspecified: Secondary | ICD-10-CM | POA: Diagnosis not present

## 2020-07-17 ENCOUNTER — Other Ambulatory Visit (HOSPITAL_COMMUNITY): Payer: Self-pay | Admitting: Nurse Practitioner

## 2020-07-17 DIAGNOSIS — Z1231 Encounter for screening mammogram for malignant neoplasm of breast: Secondary | ICD-10-CM

## 2020-07-20 DIAGNOSIS — I739 Peripheral vascular disease, unspecified: Secondary | ICD-10-CM | POA: Diagnosis not present

## 2020-07-20 DIAGNOSIS — M79672 Pain in left foot: Secondary | ICD-10-CM | POA: Diagnosis not present

## 2020-07-20 DIAGNOSIS — M79671 Pain in right foot: Secondary | ICD-10-CM | POA: Diagnosis not present

## 2020-07-20 DIAGNOSIS — E1140 Type 2 diabetes mellitus with diabetic neuropathy, unspecified: Secondary | ICD-10-CM | POA: Diagnosis not present

## 2020-07-21 ENCOUNTER — Other Ambulatory Visit: Payer: Self-pay

## 2020-07-21 ENCOUNTER — Ambulatory Visit (HOSPITAL_COMMUNITY)
Admission: RE | Admit: 2020-07-21 | Discharge: 2020-07-21 | Disposition: A | Discharge status: Home / Self Care | Payer: Medicare Other | Source: Ambulatory Visit | Attending: Nurse Practitioner | Admitting: Nurse Practitioner

## 2020-07-21 DIAGNOSIS — Z1231 Encounter for screening mammogram for malignant neoplasm of breast: Secondary | ICD-10-CM | POA: Diagnosis not present

## 2020-08-24 DIAGNOSIS — J019 Acute sinusitis, unspecified: Secondary | ICD-10-CM | POA: Diagnosis not present

## 2020-08-24 DIAGNOSIS — I10 Essential (primary) hypertension: Secondary | ICD-10-CM | POA: Diagnosis not present

## 2020-08-24 DIAGNOSIS — M1990 Unspecified osteoarthritis, unspecified site: Secondary | ICD-10-CM | POA: Diagnosis not present

## 2020-08-24 DIAGNOSIS — F419 Anxiety disorder, unspecified: Secondary | ICD-10-CM | POA: Diagnosis not present

## 2020-08-24 DIAGNOSIS — Z6827 Body mass index (BMI) 27.0-27.9, adult: Secondary | ICD-10-CM | POA: Diagnosis not present

## 2020-08-25 DIAGNOSIS — Z23 Encounter for immunization: Secondary | ICD-10-CM | POA: Diagnosis not present

## 2020-09-01 DIAGNOSIS — Z23 Encounter for immunization: Secondary | ICD-10-CM | POA: Diagnosis not present

## 2020-09-13 DIAGNOSIS — G4733 Obstructive sleep apnea (adult) (pediatric): Secondary | ICD-10-CM | POA: Diagnosis not present

## 2020-09-13 DIAGNOSIS — J019 Acute sinusitis, unspecified: Secondary | ICD-10-CM | POA: Diagnosis not present

## 2020-09-13 DIAGNOSIS — Z299 Encounter for prophylactic measures, unspecified: Secondary | ICD-10-CM | POA: Diagnosis not present

## 2020-10-02 DIAGNOSIS — F331 Major depressive disorder, recurrent, moderate: Secondary | ICD-10-CM | POA: Diagnosis not present

## 2020-10-02 DIAGNOSIS — E1122 Type 2 diabetes mellitus with diabetic chronic kidney disease: Secondary | ICD-10-CM | POA: Diagnosis not present

## 2020-10-02 DIAGNOSIS — Z299 Encounter for prophylactic measures, unspecified: Secondary | ICD-10-CM | POA: Diagnosis not present

## 2020-10-02 DIAGNOSIS — N183 Chronic kidney disease, stage 3 unspecified: Secondary | ICD-10-CM | POA: Diagnosis not present

## 2020-10-02 DIAGNOSIS — I10 Essential (primary) hypertension: Secondary | ICD-10-CM | POA: Diagnosis not present

## 2020-10-02 DIAGNOSIS — E1165 Type 2 diabetes mellitus with hyperglycemia: Secondary | ICD-10-CM | POA: Diagnosis not present

## 2020-10-05 DIAGNOSIS — I739 Peripheral vascular disease, unspecified: Secondary | ICD-10-CM | POA: Diagnosis not present

## 2020-10-05 DIAGNOSIS — M79671 Pain in right foot: Secondary | ICD-10-CM | POA: Diagnosis not present

## 2020-10-05 DIAGNOSIS — E1140 Type 2 diabetes mellitus with diabetic neuropathy, unspecified: Secondary | ICD-10-CM | POA: Diagnosis not present

## 2020-10-05 DIAGNOSIS — M79672 Pain in left foot: Secondary | ICD-10-CM | POA: Diagnosis not present

## 2020-10-19 DIAGNOSIS — M79671 Pain in right foot: Secondary | ICD-10-CM | POA: Diagnosis not present

## 2020-10-19 DIAGNOSIS — M79672 Pain in left foot: Secondary | ICD-10-CM | POA: Diagnosis not present

## 2020-11-03 DIAGNOSIS — N183 Chronic kidney disease, stage 3 unspecified: Secondary | ICD-10-CM | POA: Diagnosis not present

## 2020-11-03 DIAGNOSIS — F331 Major depressive disorder, recurrent, moderate: Secondary | ICD-10-CM | POA: Diagnosis not present

## 2020-11-03 DIAGNOSIS — E1122 Type 2 diabetes mellitus with diabetic chronic kidney disease: Secondary | ICD-10-CM | POA: Diagnosis not present

## 2020-11-10 DIAGNOSIS — M79672 Pain in left foot: Secondary | ICD-10-CM | POA: Diagnosis not present

## 2020-11-10 DIAGNOSIS — M79671 Pain in right foot: Secondary | ICD-10-CM | POA: Diagnosis not present

## 2020-11-10 DIAGNOSIS — M25579 Pain in unspecified ankle and joints of unspecified foot: Secondary | ICD-10-CM | POA: Diagnosis not present

## 2020-12-12 DIAGNOSIS — M79672 Pain in left foot: Secondary | ICD-10-CM | POA: Diagnosis not present

## 2020-12-12 DIAGNOSIS — M79671 Pain in right foot: Secondary | ICD-10-CM | POA: Diagnosis not present

## 2020-12-14 DIAGNOSIS — E119 Type 2 diabetes mellitus without complications: Secondary | ICD-10-CM | POA: Diagnosis not present

## 2020-12-14 DIAGNOSIS — Z961 Presence of intraocular lens: Secondary | ICD-10-CM | POA: Diagnosis not present

## 2020-12-14 DIAGNOSIS — B0052 Herpesviral keratitis: Secondary | ICD-10-CM | POA: Diagnosis not present

## 2020-12-14 DIAGNOSIS — H353131 Nonexudative age-related macular degeneration, bilateral, early dry stage: Secondary | ICD-10-CM | POA: Diagnosis not present

## 2020-12-20 DIAGNOSIS — E1165 Type 2 diabetes mellitus with hyperglycemia: Secondary | ICD-10-CM | POA: Diagnosis not present

## 2020-12-20 DIAGNOSIS — E1129 Type 2 diabetes mellitus with other diabetic kidney complication: Secondary | ICD-10-CM | POA: Diagnosis not present

## 2020-12-20 DIAGNOSIS — Z299 Encounter for prophylactic measures, unspecified: Secondary | ICD-10-CM | POA: Diagnosis not present

## 2020-12-20 DIAGNOSIS — Z6827 Body mass index (BMI) 27.0-27.9, adult: Secondary | ICD-10-CM | POA: Diagnosis not present

## 2020-12-20 DIAGNOSIS — E1122 Type 2 diabetes mellitus with diabetic chronic kidney disease: Secondary | ICD-10-CM | POA: Diagnosis not present

## 2020-12-20 DIAGNOSIS — Z789 Other specified health status: Secondary | ICD-10-CM | POA: Diagnosis not present

## 2020-12-20 DIAGNOSIS — I10 Essential (primary) hypertension: Secondary | ICD-10-CM | POA: Diagnosis not present

## 2020-12-21 DIAGNOSIS — I739 Peripheral vascular disease, unspecified: Secondary | ICD-10-CM | POA: Diagnosis not present

## 2020-12-21 DIAGNOSIS — E1140 Type 2 diabetes mellitus with diabetic neuropathy, unspecified: Secondary | ICD-10-CM | POA: Diagnosis not present

## 2020-12-21 DIAGNOSIS — Z299 Encounter for prophylactic measures, unspecified: Secondary | ICD-10-CM | POA: Diagnosis not present

## 2020-12-21 DIAGNOSIS — E1165 Type 2 diabetes mellitus with hyperglycemia: Secondary | ICD-10-CM | POA: Diagnosis not present

## 2020-12-21 DIAGNOSIS — I10 Essential (primary) hypertension: Secondary | ICD-10-CM | POA: Diagnosis not present

## 2020-12-21 DIAGNOSIS — M79672 Pain in left foot: Secondary | ICD-10-CM | POA: Diagnosis not present

## 2020-12-21 DIAGNOSIS — M79671 Pain in right foot: Secondary | ICD-10-CM | POA: Diagnosis not present

## 2021-01-05 DIAGNOSIS — I10 Essential (primary) hypertension: Secondary | ICD-10-CM | POA: Diagnosis not present

## 2021-01-05 DIAGNOSIS — E1129 Type 2 diabetes mellitus with other diabetic kidney complication: Secondary | ICD-10-CM | POA: Diagnosis not present

## 2021-01-05 DIAGNOSIS — Z6828 Body mass index (BMI) 28.0-28.9, adult: Secondary | ICD-10-CM | POA: Diagnosis not present

## 2021-01-05 DIAGNOSIS — Z6827 Body mass index (BMI) 27.0-27.9, adult: Secondary | ICD-10-CM | POA: Diagnosis not present

## 2021-01-05 DIAGNOSIS — E1165 Type 2 diabetes mellitus with hyperglycemia: Secondary | ICD-10-CM | POA: Diagnosis not present

## 2021-01-05 DIAGNOSIS — F331 Major depressive disorder, recurrent, moderate: Secondary | ICD-10-CM | POA: Diagnosis not present

## 2021-01-05 DIAGNOSIS — Z720 Tobacco use: Secondary | ICD-10-CM | POA: Diagnosis not present

## 2021-01-05 DIAGNOSIS — Z789 Other specified health status: Secondary | ICD-10-CM | POA: Diagnosis not present

## 2021-01-05 DIAGNOSIS — Z299 Encounter for prophylactic measures, unspecified: Secondary | ICD-10-CM | POA: Diagnosis not present

## 2021-01-09 DIAGNOSIS — M79671 Pain in right foot: Secondary | ICD-10-CM | POA: Diagnosis not present

## 2021-01-09 DIAGNOSIS — M79672 Pain in left foot: Secondary | ICD-10-CM | POA: Diagnosis not present

## 2021-01-25 ENCOUNTER — Other Ambulatory Visit (INDEPENDENT_AMBULATORY_CARE_PROVIDER_SITE_OTHER): Payer: Self-pay | Admitting: Internal Medicine

## 2021-01-25 DIAGNOSIS — K219 Gastro-esophageal reflux disease without esophagitis: Secondary | ICD-10-CM

## 2021-02-01 DIAGNOSIS — E1165 Type 2 diabetes mellitus with hyperglycemia: Secondary | ICD-10-CM | POA: Diagnosis not present

## 2021-02-06 DIAGNOSIS — M25579 Pain in unspecified ankle and joints of unspecified foot: Secondary | ICD-10-CM | POA: Diagnosis not present

## 2021-02-06 DIAGNOSIS — M79671 Pain in right foot: Secondary | ICD-10-CM | POA: Diagnosis not present

## 2021-02-06 DIAGNOSIS — M79672 Pain in left foot: Secondary | ICD-10-CM | POA: Diagnosis not present

## 2021-02-09 DIAGNOSIS — Z23 Encounter for immunization: Secondary | ICD-10-CM | POA: Diagnosis not present

## 2021-02-15 DIAGNOSIS — I10 Essential (primary) hypertension: Secondary | ICD-10-CM | POA: Diagnosis not present

## 2021-02-15 DIAGNOSIS — F331 Major depressive disorder, recurrent, moderate: Secondary | ICD-10-CM | POA: Diagnosis not present

## 2021-02-15 DIAGNOSIS — E1165 Type 2 diabetes mellitus with hyperglycemia: Secondary | ICD-10-CM | POA: Diagnosis not present

## 2021-02-15 DIAGNOSIS — Z299 Encounter for prophylactic measures, unspecified: Secondary | ICD-10-CM | POA: Diagnosis not present

## 2021-02-15 DIAGNOSIS — M069 Rheumatoid arthritis, unspecified: Secondary | ICD-10-CM | POA: Diagnosis not present

## 2021-02-15 DIAGNOSIS — E1122 Type 2 diabetes mellitus with diabetic chronic kidney disease: Secondary | ICD-10-CM | POA: Diagnosis not present

## 2021-02-27 DIAGNOSIS — E1140 Type 2 diabetes mellitus with diabetic neuropathy, unspecified: Secondary | ICD-10-CM | POA: Diagnosis not present

## 2021-02-27 DIAGNOSIS — M79672 Pain in left foot: Secondary | ICD-10-CM | POA: Diagnosis not present

## 2021-02-27 DIAGNOSIS — M79671 Pain in right foot: Secondary | ICD-10-CM | POA: Diagnosis not present

## 2021-02-27 DIAGNOSIS — I739 Peripheral vascular disease, unspecified: Secondary | ICD-10-CM | POA: Diagnosis not present

## 2021-03-01 DIAGNOSIS — E119 Type 2 diabetes mellitus without complications: Secondary | ICD-10-CM | POA: Diagnosis not present

## 2021-03-02 DIAGNOSIS — E1165 Type 2 diabetes mellitus with hyperglycemia: Secondary | ICD-10-CM | POA: Diagnosis not present

## 2021-03-06 DIAGNOSIS — M25579 Pain in unspecified ankle and joints of unspecified foot: Secondary | ICD-10-CM | POA: Diagnosis not present

## 2021-03-06 DIAGNOSIS — M79672 Pain in left foot: Secondary | ICD-10-CM | POA: Diagnosis not present

## 2021-03-06 DIAGNOSIS — M79671 Pain in right foot: Secondary | ICD-10-CM | POA: Diagnosis not present

## 2021-03-08 DIAGNOSIS — E119 Type 2 diabetes mellitus without complications: Secondary | ICD-10-CM | POA: Diagnosis not present

## 2021-03-15 DIAGNOSIS — E119 Type 2 diabetes mellitus without complications: Secondary | ICD-10-CM | POA: Diagnosis not present

## 2021-03-20 DIAGNOSIS — Z299 Encounter for prophylactic measures, unspecified: Secondary | ICD-10-CM | POA: Diagnosis not present

## 2021-03-20 DIAGNOSIS — E1165 Type 2 diabetes mellitus with hyperglycemia: Secondary | ICD-10-CM | POA: Diagnosis not present

## 2021-03-20 DIAGNOSIS — I10 Essential (primary) hypertension: Secondary | ICD-10-CM | POA: Diagnosis not present

## 2021-03-20 DIAGNOSIS — N183 Chronic kidney disease, stage 3 unspecified: Secondary | ICD-10-CM | POA: Diagnosis not present

## 2021-03-20 DIAGNOSIS — R300 Dysuria: Secondary | ICD-10-CM | POA: Diagnosis not present

## 2021-03-20 DIAGNOSIS — E1122 Type 2 diabetes mellitus with diabetic chronic kidney disease: Secondary | ICD-10-CM | POA: Diagnosis not present

## 2021-03-22 DIAGNOSIS — E119 Type 2 diabetes mellitus without complications: Secondary | ICD-10-CM | POA: Diagnosis not present

## 2021-03-29 DIAGNOSIS — E119 Type 2 diabetes mellitus without complications: Secondary | ICD-10-CM | POA: Diagnosis not present

## 2021-04-03 DIAGNOSIS — M79671 Pain in right foot: Secondary | ICD-10-CM | POA: Diagnosis not present

## 2021-04-03 DIAGNOSIS — E1140 Type 2 diabetes mellitus with diabetic neuropathy, unspecified: Secondary | ICD-10-CM | POA: Diagnosis not present

## 2021-04-03 DIAGNOSIS — M25579 Pain in unspecified ankle and joints of unspecified foot: Secondary | ICD-10-CM | POA: Diagnosis not present

## 2021-04-03 DIAGNOSIS — M79672 Pain in left foot: Secondary | ICD-10-CM | POA: Diagnosis not present

## 2021-04-03 DIAGNOSIS — E1165 Type 2 diabetes mellitus with hyperglycemia: Secondary | ICD-10-CM | POA: Diagnosis not present

## 2021-04-17 DIAGNOSIS — E1122 Type 2 diabetes mellitus with diabetic chronic kidney disease: Secondary | ICD-10-CM | POA: Diagnosis not present

## 2021-04-17 DIAGNOSIS — E1165 Type 2 diabetes mellitus with hyperglycemia: Secondary | ICD-10-CM | POA: Diagnosis not present

## 2021-04-17 DIAGNOSIS — Z6827 Body mass index (BMI) 27.0-27.9, adult: Secondary | ICD-10-CM | POA: Diagnosis not present

## 2021-04-17 DIAGNOSIS — E1142 Type 2 diabetes mellitus with diabetic polyneuropathy: Secondary | ICD-10-CM | POA: Diagnosis not present

## 2021-04-17 DIAGNOSIS — Z720 Tobacco use: Secondary | ICD-10-CM | POA: Diagnosis not present

## 2021-04-17 DIAGNOSIS — Z299 Encounter for prophylactic measures, unspecified: Secondary | ICD-10-CM | POA: Diagnosis not present

## 2021-04-17 DIAGNOSIS — R040 Epistaxis: Secondary | ICD-10-CM | POA: Diagnosis not present

## 2021-05-01 DIAGNOSIS — M79672 Pain in left foot: Secondary | ICD-10-CM | POA: Diagnosis not present

## 2021-05-01 DIAGNOSIS — M25579 Pain in unspecified ankle and joints of unspecified foot: Secondary | ICD-10-CM | POA: Diagnosis not present

## 2021-05-01 DIAGNOSIS — M79671 Pain in right foot: Secondary | ICD-10-CM | POA: Diagnosis not present

## 2021-05-03 DIAGNOSIS — E1165 Type 2 diabetes mellitus with hyperglycemia: Secondary | ICD-10-CM | POA: Diagnosis not present

## 2021-05-07 DIAGNOSIS — Z20822 Contact with and (suspected) exposure to covid-19: Secondary | ICD-10-CM | POA: Diagnosis not present

## 2021-05-15 DIAGNOSIS — M79672 Pain in left foot: Secondary | ICD-10-CM | POA: Diagnosis not present

## 2021-05-15 DIAGNOSIS — M79671 Pain in right foot: Secondary | ICD-10-CM | POA: Diagnosis not present

## 2021-05-15 DIAGNOSIS — E1140 Type 2 diabetes mellitus with diabetic neuropathy, unspecified: Secondary | ICD-10-CM | POA: Diagnosis not present

## 2021-05-15 DIAGNOSIS — I739 Peripheral vascular disease, unspecified: Secondary | ICD-10-CM | POA: Diagnosis not present

## 2021-05-18 DIAGNOSIS — Z20822 Contact with and (suspected) exposure to covid-19: Secondary | ICD-10-CM | POA: Diagnosis not present

## 2021-05-25 DIAGNOSIS — Z20822 Contact with and (suspected) exposure to covid-19: Secondary | ICD-10-CM | POA: Diagnosis not present

## 2021-05-28 DIAGNOSIS — E7800 Pure hypercholesterolemia, unspecified: Secondary | ICD-10-CM | POA: Diagnosis not present

## 2021-05-28 DIAGNOSIS — Z0000 Encounter for general adult medical examination without abnormal findings: Secondary | ICD-10-CM | POA: Diagnosis not present

## 2021-05-28 DIAGNOSIS — Z79899 Other long term (current) drug therapy: Secondary | ICD-10-CM | POA: Diagnosis not present

## 2021-05-28 DIAGNOSIS — E039 Hypothyroidism, unspecified: Secondary | ICD-10-CM | POA: Diagnosis not present

## 2021-05-28 DIAGNOSIS — Z7189 Other specified counseling: Secondary | ICD-10-CM | POA: Diagnosis not present

## 2021-05-28 DIAGNOSIS — I10 Essential (primary) hypertension: Secondary | ICD-10-CM | POA: Diagnosis not present

## 2021-05-28 DIAGNOSIS — Z1339 Encounter for screening examination for other mental health and behavioral disorders: Secondary | ICD-10-CM | POA: Diagnosis not present

## 2021-05-28 DIAGNOSIS — Z6832 Body mass index (BMI) 32.0-32.9, adult: Secondary | ICD-10-CM | POA: Diagnosis not present

## 2021-05-28 DIAGNOSIS — R5383 Other fatigue: Secondary | ICD-10-CM | POA: Diagnosis not present

## 2021-05-28 DIAGNOSIS — Z299 Encounter for prophylactic measures, unspecified: Secondary | ICD-10-CM | POA: Diagnosis not present

## 2021-05-28 DIAGNOSIS — Z1331 Encounter for screening for depression: Secondary | ICD-10-CM | POA: Diagnosis not present

## 2021-05-29 DIAGNOSIS — M79671 Pain in right foot: Secondary | ICD-10-CM | POA: Diagnosis not present

## 2021-05-29 DIAGNOSIS — M25579 Pain in unspecified ankle and joints of unspecified foot: Secondary | ICD-10-CM | POA: Diagnosis not present

## 2021-05-29 DIAGNOSIS — M79672 Pain in left foot: Secondary | ICD-10-CM | POA: Diagnosis not present

## 2021-06-01 DIAGNOSIS — E1165 Type 2 diabetes mellitus with hyperglycemia: Secondary | ICD-10-CM | POA: Diagnosis not present

## 2021-06-04 ENCOUNTER — Other Ambulatory Visit (HOSPITAL_COMMUNITY): Payer: Self-pay | Admitting: Family Medicine

## 2021-06-04 DIAGNOSIS — Z1231 Encounter for screening mammogram for malignant neoplasm of breast: Secondary | ICD-10-CM

## 2021-06-13 DIAGNOSIS — H353131 Nonexudative age-related macular degeneration, bilateral, early dry stage: Secondary | ICD-10-CM | POA: Diagnosis not present

## 2021-06-13 DIAGNOSIS — E119 Type 2 diabetes mellitus without complications: Secondary | ICD-10-CM | POA: Diagnosis not present

## 2021-06-13 DIAGNOSIS — B0052 Herpesviral keratitis: Secondary | ICD-10-CM | POA: Diagnosis not present

## 2021-06-14 DIAGNOSIS — Z1283 Encounter for screening for malignant neoplasm of skin: Secondary | ICD-10-CM | POA: Diagnosis not present

## 2021-06-14 DIAGNOSIS — X32XXXD Exposure to sunlight, subsequent encounter: Secondary | ICD-10-CM | POA: Diagnosis not present

## 2021-06-14 DIAGNOSIS — L570 Actinic keratosis: Secondary | ICD-10-CM | POA: Diagnosis not present

## 2021-06-14 DIAGNOSIS — D225 Melanocytic nevi of trunk: Secondary | ICD-10-CM | POA: Diagnosis not present

## 2021-06-15 DIAGNOSIS — Z20822 Contact with and (suspected) exposure to covid-19: Secondary | ICD-10-CM | POA: Diagnosis not present

## 2021-07-04 DIAGNOSIS — E1165 Type 2 diabetes mellitus with hyperglycemia: Secondary | ICD-10-CM | POA: Diagnosis not present

## 2021-07-13 DIAGNOSIS — M25579 Pain in unspecified ankle and joints of unspecified foot: Secondary | ICD-10-CM | POA: Diagnosis not present

## 2021-07-13 DIAGNOSIS — M79672 Pain in left foot: Secondary | ICD-10-CM | POA: Diagnosis not present

## 2021-07-13 DIAGNOSIS — M79671 Pain in right foot: Secondary | ICD-10-CM | POA: Diagnosis not present

## 2021-07-23 ENCOUNTER — Ambulatory Visit (HOSPITAL_COMMUNITY)
Admission: RE | Admit: 2021-07-23 | Discharge: 2021-07-23 | Disposition: A | Discharge status: Home / Self Care | Payer: Medicare Other | Source: Ambulatory Visit | Attending: Family Medicine | Admitting: Family Medicine

## 2021-07-23 ENCOUNTER — Other Ambulatory Visit: Payer: Self-pay

## 2021-07-23 DIAGNOSIS — Z1231 Encounter for screening mammogram for malignant neoplasm of breast: Secondary | ICD-10-CM | POA: Insufficient documentation

## 2021-07-24 DIAGNOSIS — I739 Peripheral vascular disease, unspecified: Secondary | ICD-10-CM | POA: Diagnosis not present

## 2021-07-24 DIAGNOSIS — M79672 Pain in left foot: Secondary | ICD-10-CM | POA: Diagnosis not present

## 2021-07-24 DIAGNOSIS — M79671 Pain in right foot: Secondary | ICD-10-CM | POA: Diagnosis not present

## 2021-07-24 DIAGNOSIS — E1140 Type 2 diabetes mellitus with diabetic neuropathy, unspecified: Secondary | ICD-10-CM | POA: Diagnosis not present

## 2021-07-25 DIAGNOSIS — I10 Essential (primary) hypertension: Secondary | ICD-10-CM | POA: Diagnosis not present

## 2021-07-25 DIAGNOSIS — K3184 Gastroparesis: Secondary | ICD-10-CM | POA: Diagnosis not present

## 2021-07-25 DIAGNOSIS — Z299 Encounter for prophylactic measures, unspecified: Secondary | ICD-10-CM | POA: Diagnosis not present

## 2021-07-25 DIAGNOSIS — Z23 Encounter for immunization: Secondary | ICD-10-CM | POA: Diagnosis not present

## 2021-07-25 DIAGNOSIS — E7800 Pure hypercholesterolemia, unspecified: Secondary | ICD-10-CM | POA: Diagnosis not present

## 2021-07-25 DIAGNOSIS — E1165 Type 2 diabetes mellitus with hyperglycemia: Secondary | ICD-10-CM | POA: Diagnosis not present

## 2021-08-03 DIAGNOSIS — E1165 Type 2 diabetes mellitus with hyperglycemia: Secondary | ICD-10-CM | POA: Diagnosis not present

## 2021-08-07 DIAGNOSIS — Z20822 Contact with and (suspected) exposure to covid-19: Secondary | ICD-10-CM | POA: Diagnosis not present

## 2021-08-09 DIAGNOSIS — U071 COVID-19: Secondary | ICD-10-CM | POA: Diagnosis not present

## 2021-08-09 DIAGNOSIS — Z23 Encounter for immunization: Secondary | ICD-10-CM | POA: Diagnosis not present

## 2021-08-13 DIAGNOSIS — M25579 Pain in unspecified ankle and joints of unspecified foot: Secondary | ICD-10-CM | POA: Diagnosis not present

## 2021-08-13 DIAGNOSIS — M79671 Pain in right foot: Secondary | ICD-10-CM | POA: Diagnosis not present

## 2021-08-13 DIAGNOSIS — M79672 Pain in left foot: Secondary | ICD-10-CM | POA: Diagnosis not present

## 2021-09-03 DIAGNOSIS — E1165 Type 2 diabetes mellitus with hyperglycemia: Secondary | ICD-10-CM | POA: Diagnosis not present

## 2021-09-17 DIAGNOSIS — M79671 Pain in right foot: Secondary | ICD-10-CM | POA: Diagnosis not present

## 2021-09-17 DIAGNOSIS — E1140 Type 2 diabetes mellitus with diabetic neuropathy, unspecified: Secondary | ICD-10-CM | POA: Diagnosis not present

## 2021-09-17 DIAGNOSIS — I739 Peripheral vascular disease, unspecified: Secondary | ICD-10-CM | POA: Diagnosis not present

## 2021-09-17 DIAGNOSIS — M79672 Pain in left foot: Secondary | ICD-10-CM | POA: Diagnosis not present

## 2021-10-03 DIAGNOSIS — E1165 Type 2 diabetes mellitus with hyperglycemia: Secondary | ICD-10-CM | POA: Diagnosis not present

## 2021-10-08 DIAGNOSIS — I739 Peripheral vascular disease, unspecified: Secondary | ICD-10-CM | POA: Diagnosis not present

## 2021-10-08 DIAGNOSIS — M79671 Pain in right foot: Secondary | ICD-10-CM | POA: Diagnosis not present

## 2021-10-08 DIAGNOSIS — E1140 Type 2 diabetes mellitus with diabetic neuropathy, unspecified: Secondary | ICD-10-CM | POA: Diagnosis not present

## 2021-10-08 DIAGNOSIS — M79672 Pain in left foot: Secondary | ICD-10-CM | POA: Diagnosis not present

## 2021-10-09 DIAGNOSIS — Z20822 Contact with and (suspected) exposure to covid-19: Secondary | ICD-10-CM | POA: Diagnosis not present

## 2021-10-11 DIAGNOSIS — Z20822 Contact with and (suspected) exposure to covid-19: Secondary | ICD-10-CM | POA: Diagnosis not present

## 2021-10-15 DIAGNOSIS — M79671 Pain in right foot: Secondary | ICD-10-CM | POA: Diagnosis not present

## 2021-10-15 DIAGNOSIS — M79672 Pain in left foot: Secondary | ICD-10-CM | POA: Diagnosis not present

## 2021-10-15 DIAGNOSIS — M25579 Pain in unspecified ankle and joints of unspecified foot: Secondary | ICD-10-CM | POA: Diagnosis not present

## 2021-10-25 DIAGNOSIS — Z299 Encounter for prophylactic measures, unspecified: Secondary | ICD-10-CM | POA: Diagnosis not present

## 2021-10-25 DIAGNOSIS — J329 Chronic sinusitis, unspecified: Secondary | ICD-10-CM | POA: Diagnosis not present

## 2021-10-25 DIAGNOSIS — N183 Chronic kidney disease, stage 3 unspecified: Secondary | ICD-10-CM | POA: Diagnosis not present

## 2021-10-25 DIAGNOSIS — I10 Essential (primary) hypertension: Secondary | ICD-10-CM | POA: Diagnosis not present

## 2021-10-25 DIAGNOSIS — Z6828 Body mass index (BMI) 28.0-28.9, adult: Secondary | ICD-10-CM | POA: Diagnosis not present

## 2021-10-25 DIAGNOSIS — E1165 Type 2 diabetes mellitus with hyperglycemia: Secondary | ICD-10-CM | POA: Diagnosis not present

## 2021-10-25 DIAGNOSIS — Z789 Other specified health status: Secondary | ICD-10-CM | POA: Diagnosis not present

## 2021-11-02 DIAGNOSIS — E1165 Type 2 diabetes mellitus with hyperglycemia: Secondary | ICD-10-CM | POA: Diagnosis not present

## 2021-11-07 DIAGNOSIS — E1165 Type 2 diabetes mellitus with hyperglycemia: Secondary | ICD-10-CM | POA: Diagnosis not present

## 2021-11-07 DIAGNOSIS — Z299 Encounter for prophylactic measures, unspecified: Secondary | ICD-10-CM | POA: Diagnosis not present

## 2021-11-07 DIAGNOSIS — E1129 Type 2 diabetes mellitus with other diabetic kidney complication: Secondary | ICD-10-CM | POA: Diagnosis not present

## 2021-11-07 DIAGNOSIS — E1122 Type 2 diabetes mellitus with diabetic chronic kidney disease: Secondary | ICD-10-CM | POA: Diagnosis not present

## 2021-11-07 DIAGNOSIS — E162 Hypoglycemia, unspecified: Secondary | ICD-10-CM | POA: Diagnosis not present

## 2021-11-07 DIAGNOSIS — I10 Essential (primary) hypertension: Secondary | ICD-10-CM | POA: Diagnosis not present

## 2021-11-09 DIAGNOSIS — Z299 Encounter for prophylactic measures, unspecified: Secondary | ICD-10-CM | POA: Diagnosis not present

## 2021-11-09 DIAGNOSIS — E1142 Type 2 diabetes mellitus with diabetic polyneuropathy: Secondary | ICD-10-CM | POA: Diagnosis not present

## 2021-11-09 DIAGNOSIS — E1122 Type 2 diabetes mellitus with diabetic chronic kidney disease: Secondary | ICD-10-CM | POA: Diagnosis not present

## 2021-11-09 DIAGNOSIS — I10 Essential (primary) hypertension: Secondary | ICD-10-CM | POA: Diagnosis not present

## 2021-11-09 DIAGNOSIS — Z789 Other specified health status: Secondary | ICD-10-CM | POA: Diagnosis not present

## 2021-11-09 DIAGNOSIS — J019 Acute sinusitis, unspecified: Secondary | ICD-10-CM | POA: Diagnosis not present

## 2021-11-09 DIAGNOSIS — E1165 Type 2 diabetes mellitus with hyperglycemia: Secondary | ICD-10-CM | POA: Diagnosis not present

## 2021-11-15 DIAGNOSIS — Z20822 Contact with and (suspected) exposure to covid-19: Secondary | ICD-10-CM | POA: Diagnosis not present

## 2021-11-16 DIAGNOSIS — R42 Dizziness and giddiness: Secondary | ICD-10-CM | POA: Diagnosis not present

## 2021-11-16 DIAGNOSIS — Z299 Encounter for prophylactic measures, unspecified: Secondary | ICD-10-CM | POA: Diagnosis not present

## 2021-11-16 DIAGNOSIS — E1165 Type 2 diabetes mellitus with hyperglycemia: Secondary | ICD-10-CM | POA: Diagnosis not present

## 2021-11-16 DIAGNOSIS — Z6827 Body mass index (BMI) 27.0-27.9, adult: Secondary | ICD-10-CM | POA: Diagnosis not present

## 2021-11-16 DIAGNOSIS — E1122 Type 2 diabetes mellitus with diabetic chronic kidney disease: Secondary | ICD-10-CM | POA: Diagnosis not present

## 2021-11-16 DIAGNOSIS — I10 Essential (primary) hypertension: Secondary | ICD-10-CM | POA: Diagnosis not present

## 2021-11-16 DIAGNOSIS — Z789 Other specified health status: Secondary | ICD-10-CM | POA: Diagnosis not present

## 2021-11-19 DIAGNOSIS — M79672 Pain in left foot: Secondary | ICD-10-CM | POA: Diagnosis not present

## 2021-11-19 DIAGNOSIS — M79671 Pain in right foot: Secondary | ICD-10-CM | POA: Diagnosis not present

## 2021-11-19 DIAGNOSIS — E1140 Type 2 diabetes mellitus with diabetic neuropathy, unspecified: Secondary | ICD-10-CM | POA: Diagnosis not present

## 2021-11-19 DIAGNOSIS — M25579 Pain in unspecified ankle and joints of unspecified foot: Secondary | ICD-10-CM | POA: Diagnosis not present

## 2021-12-02 DIAGNOSIS — E1165 Type 2 diabetes mellitus with hyperglycemia: Secondary | ICD-10-CM | POA: Diagnosis not present

## 2021-12-07 DIAGNOSIS — E1122 Type 2 diabetes mellitus with diabetic chronic kidney disease: Secondary | ICD-10-CM | POA: Diagnosis not present

## 2021-12-07 DIAGNOSIS — N183 Chronic kidney disease, stage 3 unspecified: Secondary | ICD-10-CM | POA: Diagnosis not present

## 2021-12-07 DIAGNOSIS — E1165 Type 2 diabetes mellitus with hyperglycemia: Secondary | ICD-10-CM | POA: Diagnosis not present

## 2021-12-07 DIAGNOSIS — I10 Essential (primary) hypertension: Secondary | ICD-10-CM | POA: Diagnosis not present

## 2021-12-07 DIAGNOSIS — Z299 Encounter for prophylactic measures, unspecified: Secondary | ICD-10-CM | POA: Diagnosis not present

## 2021-12-17 DIAGNOSIS — M79671 Pain in right foot: Secondary | ICD-10-CM | POA: Diagnosis not present

## 2021-12-17 DIAGNOSIS — E1140 Type 2 diabetes mellitus with diabetic neuropathy, unspecified: Secondary | ICD-10-CM | POA: Diagnosis not present

## 2021-12-17 DIAGNOSIS — I739 Peripheral vascular disease, unspecified: Secondary | ICD-10-CM | POA: Diagnosis not present

## 2021-12-17 DIAGNOSIS — M79672 Pain in left foot: Secondary | ICD-10-CM | POA: Diagnosis not present

## 2021-12-20 DIAGNOSIS — E119 Type 2 diabetes mellitus without complications: Secondary | ICD-10-CM | POA: Diagnosis not present

## 2021-12-20 DIAGNOSIS — H353123 Nonexudative age-related macular degeneration, left eye, advanced atrophic without subfoveal involvement: Secondary | ICD-10-CM | POA: Diagnosis not present

## 2021-12-20 DIAGNOSIS — B0052 Herpesviral keratitis: Secondary | ICD-10-CM | POA: Diagnosis not present

## 2021-12-20 DIAGNOSIS — M79671 Pain in right foot: Secondary | ICD-10-CM | POA: Diagnosis not present

## 2021-12-20 DIAGNOSIS — M792 Neuralgia and neuritis, unspecified: Secondary | ICD-10-CM | POA: Diagnosis not present

## 2021-12-20 DIAGNOSIS — M79672 Pain in left foot: Secondary | ICD-10-CM | POA: Diagnosis not present

## 2021-12-20 DIAGNOSIS — H353112 Nonexudative age-related macular degeneration, right eye, intermediate dry stage: Secondary | ICD-10-CM | POA: Diagnosis not present

## 2021-12-31 DIAGNOSIS — Z20822 Contact with and (suspected) exposure to covid-19: Secondary | ICD-10-CM | POA: Diagnosis not present

## 2022-01-01 DIAGNOSIS — E1165 Type 2 diabetes mellitus with hyperglycemia: Secondary | ICD-10-CM | POA: Diagnosis not present

## 2022-01-07 DIAGNOSIS — E1165 Type 2 diabetes mellitus with hyperglycemia: Secondary | ICD-10-CM | POA: Diagnosis not present

## 2022-01-07 DIAGNOSIS — Z789 Other specified health status: Secondary | ICD-10-CM | POA: Diagnosis not present

## 2022-01-07 DIAGNOSIS — Z6828 Body mass index (BMI) 28.0-28.9, adult: Secondary | ICD-10-CM | POA: Diagnosis not present

## 2022-01-07 DIAGNOSIS — I10 Essential (primary) hypertension: Secondary | ICD-10-CM | POA: Diagnosis not present

## 2022-01-07 DIAGNOSIS — Z299 Encounter for prophylactic measures, unspecified: Secondary | ICD-10-CM | POA: Diagnosis not present

## 2022-01-07 DIAGNOSIS — H353290 Exudative age-related macular degeneration, unspecified eye, stage unspecified: Secondary | ICD-10-CM | POA: Diagnosis not present

## 2022-01-08 DIAGNOSIS — H353112 Nonexudative age-related macular degeneration, right eye, intermediate dry stage: Secondary | ICD-10-CM | POA: Diagnosis not present

## 2022-01-08 DIAGNOSIS — E103293 Type 1 diabetes mellitus with mild nonproliferative diabetic retinopathy without macular edema, bilateral: Secondary | ICD-10-CM | POA: Diagnosis not present

## 2022-01-08 DIAGNOSIS — H353124 Nonexudative age-related macular degeneration, left eye, advanced atrophic with subfoveal involvement: Secondary | ICD-10-CM | POA: Diagnosis not present

## 2022-01-08 DIAGNOSIS — H43811 Vitreous degeneration, right eye: Secondary | ICD-10-CM | POA: Diagnosis not present

## 2022-01-17 DIAGNOSIS — M792 Neuralgia and neuritis, unspecified: Secondary | ICD-10-CM | POA: Diagnosis not present

## 2022-01-17 DIAGNOSIS — M79672 Pain in left foot: Secondary | ICD-10-CM | POA: Diagnosis not present

## 2022-01-17 DIAGNOSIS — M79671 Pain in right foot: Secondary | ICD-10-CM | POA: Diagnosis not present

## 2022-01-18 DIAGNOSIS — Z20822 Contact with and (suspected) exposure to covid-19: Secondary | ICD-10-CM | POA: Diagnosis not present

## 2022-01-25 DIAGNOSIS — Z20822 Contact with and (suspected) exposure to covid-19: Secondary | ICD-10-CM | POA: Diagnosis not present

## 2022-01-29 DIAGNOSIS — Z20822 Contact with and (suspected) exposure to covid-19: Secondary | ICD-10-CM | POA: Diagnosis not present

## 2022-01-31 DIAGNOSIS — E1165 Type 2 diabetes mellitus with hyperglycemia: Secondary | ICD-10-CM | POA: Diagnosis not present

## 2022-02-05 DIAGNOSIS — H353134 Nonexudative age-related macular degeneration, bilateral, advanced atrophic with subfoveal involvement: Secondary | ICD-10-CM | POA: Diagnosis not present

## 2022-02-05 DIAGNOSIS — H353112 Nonexudative age-related macular degeneration, right eye, intermediate dry stage: Secondary | ICD-10-CM | POA: Diagnosis not present

## 2022-02-05 DIAGNOSIS — H43811 Vitreous degeneration, right eye: Secondary | ICD-10-CM | POA: Diagnosis not present

## 2022-02-05 DIAGNOSIS — E113293 Type 2 diabetes mellitus with mild nonproliferative diabetic retinopathy without macular edema, bilateral: Secondary | ICD-10-CM | POA: Diagnosis not present

## 2022-02-07 DIAGNOSIS — Z6829 Body mass index (BMI) 29.0-29.9, adult: Secondary | ICD-10-CM | POA: Diagnosis not present

## 2022-02-07 DIAGNOSIS — Z299 Encounter for prophylactic measures, unspecified: Secondary | ICD-10-CM | POA: Diagnosis not present

## 2022-02-07 DIAGNOSIS — E1165 Type 2 diabetes mellitus with hyperglycemia: Secondary | ICD-10-CM | POA: Diagnosis not present

## 2022-02-07 DIAGNOSIS — I10 Essential (primary) hypertension: Secondary | ICD-10-CM | POA: Diagnosis not present

## 2022-02-07 DIAGNOSIS — H353190 Nonexudative age-related macular degeneration, unspecified eye, stage unspecified: Secondary | ICD-10-CM | POA: Diagnosis not present

## 2022-02-07 DIAGNOSIS — E7800 Pure hypercholesterolemia, unspecified: Secondary | ICD-10-CM | POA: Diagnosis not present

## 2022-02-07 DIAGNOSIS — Z20828 Contact with and (suspected) exposure to other viral communicable diseases: Secondary | ICD-10-CM | POA: Diagnosis not present

## 2022-02-15 DIAGNOSIS — Z20822 Contact with and (suspected) exposure to covid-19: Secondary | ICD-10-CM | POA: Diagnosis not present

## 2022-02-18 DIAGNOSIS — M79672 Pain in left foot: Secondary | ICD-10-CM | POA: Diagnosis not present

## 2022-02-18 DIAGNOSIS — M792 Neuralgia and neuritis, unspecified: Secondary | ICD-10-CM | POA: Diagnosis not present

## 2022-02-18 DIAGNOSIS — G600 Hereditary motor and sensory neuropathy: Secondary | ICD-10-CM | POA: Diagnosis not present

## 2022-02-18 DIAGNOSIS — M79671 Pain in right foot: Secondary | ICD-10-CM | POA: Diagnosis not present

## 2022-02-18 DIAGNOSIS — G5790 Unspecified mononeuropathy of unspecified lower limb: Secondary | ICD-10-CM | POA: Diagnosis not present

## 2022-02-25 DIAGNOSIS — M79672 Pain in left foot: Secondary | ICD-10-CM | POA: Diagnosis not present

## 2022-02-25 DIAGNOSIS — I739 Peripheral vascular disease, unspecified: Secondary | ICD-10-CM | POA: Diagnosis not present

## 2022-02-25 DIAGNOSIS — Z20822 Contact with and (suspected) exposure to covid-19: Secondary | ICD-10-CM | POA: Diagnosis not present

## 2022-02-25 DIAGNOSIS — E1140 Type 2 diabetes mellitus with diabetic neuropathy, unspecified: Secondary | ICD-10-CM | POA: Diagnosis not present

## 2022-02-25 DIAGNOSIS — M79671 Pain in right foot: Secondary | ICD-10-CM | POA: Diagnosis not present

## 2022-02-26 DIAGNOSIS — Z20822 Contact with and (suspected) exposure to covid-19: Secondary | ICD-10-CM | POA: Diagnosis not present

## 2022-02-27 DIAGNOSIS — Z20822 Contact with and (suspected) exposure to covid-19: Secondary | ICD-10-CM | POA: Diagnosis not present

## 2022-03-01 DIAGNOSIS — Z20822 Contact with and (suspected) exposure to covid-19: Secondary | ICD-10-CM | POA: Diagnosis not present

## 2022-03-03 DIAGNOSIS — E1165 Type 2 diabetes mellitus with hyperglycemia: Secondary | ICD-10-CM | POA: Diagnosis not present

## 2022-03-03 DIAGNOSIS — Z20822 Contact with and (suspected) exposure to covid-19: Secondary | ICD-10-CM | POA: Diagnosis not present

## 2022-03-05 DIAGNOSIS — R051 Acute cough: Secondary | ICD-10-CM | POA: Diagnosis not present

## 2022-03-05 DIAGNOSIS — Z20822 Contact with and (suspected) exposure to covid-19: Secondary | ICD-10-CM | POA: Diagnosis not present

## 2022-03-05 DIAGNOSIS — R059 Cough, unspecified: Secondary | ICD-10-CM | POA: Diagnosis not present

## 2022-03-09 DIAGNOSIS — Z20822 Contact with and (suspected) exposure to covid-19: Secondary | ICD-10-CM | POA: Diagnosis not present

## 2022-03-11 DIAGNOSIS — Z20822 Contact with and (suspected) exposure to covid-19: Secondary | ICD-10-CM | POA: Diagnosis not present

## 2022-03-12 DIAGNOSIS — Z20822 Contact with and (suspected) exposure to covid-19: Secondary | ICD-10-CM | POA: Diagnosis not present

## 2022-03-18 DIAGNOSIS — M792 Neuralgia and neuritis, unspecified: Secondary | ICD-10-CM | POA: Diagnosis not present

## 2022-03-18 DIAGNOSIS — E1140 Type 2 diabetes mellitus with diabetic neuropathy, unspecified: Secondary | ICD-10-CM | POA: Diagnosis not present

## 2022-03-18 DIAGNOSIS — M79671 Pain in right foot: Secondary | ICD-10-CM | POA: Diagnosis not present

## 2022-03-18 DIAGNOSIS — G5790 Unspecified mononeuropathy of unspecified lower limb: Secondary | ICD-10-CM | POA: Diagnosis not present

## 2022-03-18 DIAGNOSIS — M79672 Pain in left foot: Secondary | ICD-10-CM | POA: Diagnosis not present

## 2022-04-02 DIAGNOSIS — E1165 Type 2 diabetes mellitus with hyperglycemia: Secondary | ICD-10-CM | POA: Diagnosis not present

## 2022-04-15 DIAGNOSIS — M792 Neuralgia and neuritis, unspecified: Secondary | ICD-10-CM | POA: Diagnosis not present

## 2022-04-15 DIAGNOSIS — M79671 Pain in right foot: Secondary | ICD-10-CM | POA: Diagnosis not present

## 2022-04-15 DIAGNOSIS — M79672 Pain in left foot: Secondary | ICD-10-CM | POA: Diagnosis not present

## 2022-05-02 DIAGNOSIS — E1165 Type 2 diabetes mellitus with hyperglycemia: Secondary | ICD-10-CM | POA: Diagnosis not present

## 2022-05-20 DIAGNOSIS — M79671 Pain in right foot: Secondary | ICD-10-CM | POA: Diagnosis not present

## 2022-05-20 DIAGNOSIS — M79675 Pain in left toe(s): Secondary | ICD-10-CM | POA: Diagnosis not present

## 2022-05-20 DIAGNOSIS — M79672 Pain in left foot: Secondary | ICD-10-CM | POA: Diagnosis not present

## 2022-05-20 DIAGNOSIS — M79674 Pain in right toe(s): Secondary | ICD-10-CM | POA: Diagnosis not present

## 2022-05-20 DIAGNOSIS — M792 Neuralgia and neuritis, unspecified: Secondary | ICD-10-CM | POA: Diagnosis not present

## 2022-05-20 DIAGNOSIS — I739 Peripheral vascular disease, unspecified: Secondary | ICD-10-CM | POA: Diagnosis not present

## 2022-05-21 DIAGNOSIS — M79671 Pain in right foot: Secondary | ICD-10-CM | POA: Diagnosis not present

## 2022-05-21 DIAGNOSIS — I739 Peripheral vascular disease, unspecified: Secondary | ICD-10-CM | POA: Diagnosis not present

## 2022-05-21 DIAGNOSIS — E1140 Type 2 diabetes mellitus with diabetic neuropathy, unspecified: Secondary | ICD-10-CM | POA: Diagnosis not present

## 2022-05-21 DIAGNOSIS — M79672 Pain in left foot: Secondary | ICD-10-CM | POA: Diagnosis not present

## 2022-05-29 DIAGNOSIS — R5383 Other fatigue: Secondary | ICD-10-CM | POA: Diagnosis not present

## 2022-05-29 DIAGNOSIS — Z79899 Other long term (current) drug therapy: Secondary | ICD-10-CM | POA: Diagnosis not present

## 2022-05-29 DIAGNOSIS — E1165 Type 2 diabetes mellitus with hyperglycemia: Secondary | ICD-10-CM | POA: Diagnosis not present

## 2022-05-29 DIAGNOSIS — Z0000 Encounter for general adult medical examination without abnormal findings: Secondary | ICD-10-CM | POA: Diagnosis not present

## 2022-05-29 DIAGNOSIS — Z7189 Other specified counseling: Secondary | ICD-10-CM | POA: Diagnosis not present

## 2022-05-29 DIAGNOSIS — F419 Anxiety disorder, unspecified: Secondary | ICD-10-CM | POA: Diagnosis not present

## 2022-05-29 DIAGNOSIS — Z1331 Encounter for screening for depression: Secondary | ICD-10-CM | POA: Diagnosis not present

## 2022-05-29 DIAGNOSIS — E7800 Pure hypercholesterolemia, unspecified: Secondary | ICD-10-CM | POA: Diagnosis not present

## 2022-05-29 DIAGNOSIS — Z299 Encounter for prophylactic measures, unspecified: Secondary | ICD-10-CM | POA: Diagnosis not present

## 2022-06-03 DIAGNOSIS — E1165 Type 2 diabetes mellitus with hyperglycemia: Secondary | ICD-10-CM | POA: Diagnosis not present

## 2022-06-13 ENCOUNTER — Other Ambulatory Visit: Payer: Self-pay | Admitting: Nurse Practitioner

## 2022-06-13 DIAGNOSIS — Z1283 Encounter for screening for malignant neoplasm of skin: Secondary | ICD-10-CM | POA: Diagnosis not present

## 2022-06-13 DIAGNOSIS — X32XXXD Exposure to sunlight, subsequent encounter: Secondary | ICD-10-CM | POA: Diagnosis not present

## 2022-06-13 DIAGNOSIS — Z1231 Encounter for screening mammogram for malignant neoplasm of breast: Secondary | ICD-10-CM

## 2022-06-13 DIAGNOSIS — D225 Melanocytic nevi of trunk: Secondary | ICD-10-CM | POA: Diagnosis not present

## 2022-06-13 DIAGNOSIS — L570 Actinic keratosis: Secondary | ICD-10-CM | POA: Diagnosis not present

## 2022-06-17 DIAGNOSIS — M792 Neuralgia and neuritis, unspecified: Secondary | ICD-10-CM | POA: Diagnosis not present

## 2022-06-17 DIAGNOSIS — M79672 Pain in left foot: Secondary | ICD-10-CM | POA: Diagnosis not present

## 2022-06-17 DIAGNOSIS — M79671 Pain in right foot: Secondary | ICD-10-CM | POA: Diagnosis not present

## 2022-06-17 DIAGNOSIS — E1140 Type 2 diabetes mellitus with diabetic neuropathy, unspecified: Secondary | ICD-10-CM | POA: Diagnosis not present

## 2022-06-27 DIAGNOSIS — B0052 Herpesviral keratitis: Secondary | ICD-10-CM | POA: Diagnosis not present

## 2022-06-27 DIAGNOSIS — E119 Type 2 diabetes mellitus without complications: Secondary | ICD-10-CM | POA: Diagnosis not present

## 2022-06-27 DIAGNOSIS — H353123 Nonexudative age-related macular degeneration, left eye, advanced atrophic without subfoveal involvement: Secondary | ICD-10-CM | POA: Diagnosis not present

## 2022-06-27 DIAGNOSIS — H353112 Nonexudative age-related macular degeneration, right eye, intermediate dry stage: Secondary | ICD-10-CM | POA: Diagnosis not present

## 2022-07-03 DIAGNOSIS — E1165 Type 2 diabetes mellitus with hyperglycemia: Secondary | ICD-10-CM | POA: Diagnosis not present

## 2022-07-10 DIAGNOSIS — Z23 Encounter for immunization: Secondary | ICD-10-CM | POA: Diagnosis not present

## 2022-07-15 DIAGNOSIS — H43813 Vitreous degeneration, bilateral: Secondary | ICD-10-CM | POA: Diagnosis not present

## 2022-07-15 DIAGNOSIS — H353134 Nonexudative age-related macular degeneration, bilateral, advanced atrophic with subfoveal involvement: Secondary | ICD-10-CM | POA: Diagnosis not present

## 2022-07-15 DIAGNOSIS — H35033 Hypertensive retinopathy, bilateral: Secondary | ICD-10-CM | POA: Diagnosis not present

## 2022-07-15 DIAGNOSIS — E113293 Type 2 diabetes mellitus with mild nonproliferative diabetic retinopathy without macular edema, bilateral: Secondary | ICD-10-CM | POA: Diagnosis not present

## 2022-07-16 DIAGNOSIS — E1140 Type 2 diabetes mellitus with diabetic neuropathy, unspecified: Secondary | ICD-10-CM | POA: Diagnosis not present

## 2022-07-16 DIAGNOSIS — M79672 Pain in left foot: Secondary | ICD-10-CM | POA: Diagnosis not present

## 2022-07-16 DIAGNOSIS — I739 Peripheral vascular disease, unspecified: Secondary | ICD-10-CM | POA: Diagnosis not present

## 2022-07-16 DIAGNOSIS — M79671 Pain in right foot: Secondary | ICD-10-CM | POA: Diagnosis not present

## 2022-07-17 ENCOUNTER — Inpatient Hospital Stay: Admission: RE | Admit: 2022-07-17 | Payer: Medicare Other | Source: Ambulatory Visit

## 2022-07-30 DIAGNOSIS — M79671 Pain in right foot: Secondary | ICD-10-CM | POA: Diagnosis not present

## 2022-07-30 DIAGNOSIS — E1140 Type 2 diabetes mellitus with diabetic neuropathy, unspecified: Secondary | ICD-10-CM | POA: Diagnosis not present

## 2022-07-30 DIAGNOSIS — I739 Peripheral vascular disease, unspecified: Secondary | ICD-10-CM | POA: Diagnosis not present

## 2022-07-30 DIAGNOSIS — M79672 Pain in left foot: Secondary | ICD-10-CM | POA: Diagnosis not present

## 2022-07-31 DIAGNOSIS — Z23 Encounter for immunization: Secondary | ICD-10-CM | POA: Diagnosis not present

## 2022-08-01 DIAGNOSIS — H16223 Keratoconjunctivitis sicca, not specified as Sjogren's, bilateral: Secondary | ICD-10-CM | POA: Diagnosis not present

## 2022-08-01 DIAGNOSIS — H353112 Nonexudative age-related macular degeneration, right eye, intermediate dry stage: Secondary | ICD-10-CM | POA: Diagnosis not present

## 2022-08-01 DIAGNOSIS — H353123 Nonexudative age-related macular degeneration, left eye, advanced atrophic without subfoveal involvement: Secondary | ICD-10-CM | POA: Diagnosis not present

## 2022-08-02 DIAGNOSIS — E1165 Type 2 diabetes mellitus with hyperglycemia: Secondary | ICD-10-CM | POA: Diagnosis not present

## 2022-08-12 DIAGNOSIS — M79671 Pain in right foot: Secondary | ICD-10-CM | POA: Diagnosis not present

## 2022-08-12 DIAGNOSIS — M79672 Pain in left foot: Secondary | ICD-10-CM | POA: Diagnosis not present

## 2022-08-12 DIAGNOSIS — M792 Neuralgia and neuritis, unspecified: Secondary | ICD-10-CM | POA: Diagnosis not present

## 2022-09-02 DIAGNOSIS — Z6827 Body mass index (BMI) 27.0-27.9, adult: Secondary | ICD-10-CM | POA: Diagnosis not present

## 2022-09-02 DIAGNOSIS — I10 Essential (primary) hypertension: Secondary | ICD-10-CM | POA: Diagnosis not present

## 2022-09-02 DIAGNOSIS — Z713 Dietary counseling and surveillance: Secondary | ICD-10-CM | POA: Diagnosis not present

## 2022-09-02 DIAGNOSIS — Z299 Encounter for prophylactic measures, unspecified: Secondary | ICD-10-CM | POA: Diagnosis not present

## 2022-09-02 DIAGNOSIS — E1165 Type 2 diabetes mellitus with hyperglycemia: Secondary | ICD-10-CM | POA: Diagnosis not present

## 2022-09-02 DIAGNOSIS — Z1211 Encounter for screening for malignant neoplasm of colon: Secondary | ICD-10-CM | POA: Diagnosis not present

## 2022-09-09 DIAGNOSIS — M792 Neuralgia and neuritis, unspecified: Secondary | ICD-10-CM | POA: Diagnosis not present

## 2022-09-09 DIAGNOSIS — E1140 Type 2 diabetes mellitus with diabetic neuropathy, unspecified: Secondary | ICD-10-CM | POA: Diagnosis not present

## 2022-09-09 DIAGNOSIS — M79671 Pain in right foot: Secondary | ICD-10-CM | POA: Diagnosis not present

## 2022-09-09 DIAGNOSIS — M79672 Pain in left foot: Secondary | ICD-10-CM | POA: Diagnosis not present

## 2022-09-10 DIAGNOSIS — Z299 Encounter for prophylactic measures, unspecified: Secondary | ICD-10-CM | POA: Diagnosis not present

## 2022-09-10 DIAGNOSIS — I10 Essential (primary) hypertension: Secondary | ICD-10-CM | POA: Diagnosis not present

## 2022-09-10 DIAGNOSIS — E1165 Type 2 diabetes mellitus with hyperglycemia: Secondary | ICD-10-CM | POA: Diagnosis not present

## 2022-09-11 ENCOUNTER — Ambulatory Visit
Admission: RE | Admit: 2022-09-11 | Discharge: 2022-09-11 | Disposition: A | Discharge status: Home / Self Care | Payer: Medicare Other | Source: Ambulatory Visit | Attending: Nurse Practitioner | Admitting: Nurse Practitioner

## 2022-09-11 DIAGNOSIS — Z1211 Encounter for screening for malignant neoplasm of colon: Secondary | ICD-10-CM | POA: Diagnosis not present

## 2022-09-11 DIAGNOSIS — Z1231 Encounter for screening mammogram for malignant neoplasm of breast: Secondary | ICD-10-CM | POA: Diagnosis not present

## 2022-09-15 ENCOUNTER — Encounter (INDEPENDENT_AMBULATORY_CARE_PROVIDER_SITE_OTHER): Payer: Self-pay | Admitting: Gastroenterology

## 2022-09-24 DIAGNOSIS — Z299 Encounter for prophylactic measures, unspecified: Secondary | ICD-10-CM | POA: Diagnosis not present

## 2022-09-24 DIAGNOSIS — E1122 Type 2 diabetes mellitus with diabetic chronic kidney disease: Secondary | ICD-10-CM | POA: Diagnosis not present

## 2022-09-24 DIAGNOSIS — N760 Acute vaginitis: Secondary | ICD-10-CM | POA: Diagnosis not present

## 2022-09-24 DIAGNOSIS — Z6827 Body mass index (BMI) 27.0-27.9, adult: Secondary | ICD-10-CM | POA: Diagnosis not present

## 2022-09-24 DIAGNOSIS — I10 Essential (primary) hypertension: Secondary | ICD-10-CM | POA: Diagnosis not present

## 2022-10-02 DIAGNOSIS — E1165 Type 2 diabetes mellitus with hyperglycemia: Secondary | ICD-10-CM | POA: Diagnosis not present

## 2022-10-07 DIAGNOSIS — H353123 Nonexudative age-related macular degeneration, left eye, advanced atrophic without subfoveal involvement: Secondary | ICD-10-CM | POA: Diagnosis not present

## 2022-10-07 DIAGNOSIS — H353112 Nonexudative age-related macular degeneration, right eye, intermediate dry stage: Secondary | ICD-10-CM | POA: Diagnosis not present

## 2022-10-07 DIAGNOSIS — H16223 Keratoconjunctivitis sicca, not specified as Sjogren's, bilateral: Secondary | ICD-10-CM | POA: Diagnosis not present

## 2022-10-08 DIAGNOSIS — E1140 Type 2 diabetes mellitus with diabetic neuropathy, unspecified: Secondary | ICD-10-CM | POA: Diagnosis not present

## 2022-10-08 DIAGNOSIS — I739 Peripheral vascular disease, unspecified: Secondary | ICD-10-CM | POA: Diagnosis not present

## 2022-10-08 DIAGNOSIS — M79672 Pain in left foot: Secondary | ICD-10-CM | POA: Diagnosis not present

## 2022-10-08 DIAGNOSIS — M79671 Pain in right foot: Secondary | ICD-10-CM | POA: Diagnosis not present

## 2022-10-10 DIAGNOSIS — M79672 Pain in left foot: Secondary | ICD-10-CM | POA: Diagnosis not present

## 2022-10-10 DIAGNOSIS — M792 Neuralgia and neuritis, unspecified: Secondary | ICD-10-CM | POA: Diagnosis not present

## 2022-10-10 DIAGNOSIS — E1140 Type 2 diabetes mellitus with diabetic neuropathy, unspecified: Secondary | ICD-10-CM | POA: Diagnosis not present

## 2022-10-10 DIAGNOSIS — M79671 Pain in right foot: Secondary | ICD-10-CM | POA: Diagnosis not present

## 2022-10-24 DIAGNOSIS — E1142 Type 2 diabetes mellitus with diabetic polyneuropathy: Secondary | ICD-10-CM | POA: Diagnosis not present

## 2022-10-24 DIAGNOSIS — Z6828 Body mass index (BMI) 28.0-28.9, adult: Secondary | ICD-10-CM | POA: Diagnosis not present

## 2022-10-24 DIAGNOSIS — I10 Essential (primary) hypertension: Secondary | ICD-10-CM | POA: Diagnosis not present

## 2022-10-24 DIAGNOSIS — Z299 Encounter for prophylactic measures, unspecified: Secondary | ICD-10-CM | POA: Diagnosis not present

## 2022-10-24 DIAGNOSIS — I739 Peripheral vascular disease, unspecified: Secondary | ICD-10-CM | POA: Diagnosis not present

## 2022-10-24 DIAGNOSIS — E1165 Type 2 diabetes mellitus with hyperglycemia: Secondary | ICD-10-CM | POA: Diagnosis not present

## 2022-11-01 DIAGNOSIS — E1165 Type 2 diabetes mellitus with hyperglycemia: Secondary | ICD-10-CM | POA: Diagnosis not present

## 2022-11-07 DIAGNOSIS — M79671 Pain in right foot: Secondary | ICD-10-CM | POA: Diagnosis not present

## 2022-11-07 DIAGNOSIS — E1140 Type 2 diabetes mellitus with diabetic neuropathy, unspecified: Secondary | ICD-10-CM | POA: Diagnosis not present

## 2022-11-07 DIAGNOSIS — M79672 Pain in left foot: Secondary | ICD-10-CM | POA: Diagnosis not present

## 2022-11-07 DIAGNOSIS — M792 Neuralgia and neuritis, unspecified: Secondary | ICD-10-CM | POA: Diagnosis not present

## 2022-11-18 DIAGNOSIS — I70213 Atherosclerosis of native arteries of extremities with intermittent claudication, bilateral legs: Secondary | ICD-10-CM | POA: Diagnosis not present

## 2022-11-25 DIAGNOSIS — I739 Peripheral vascular disease, unspecified: Secondary | ICD-10-CM | POA: Diagnosis not present

## 2022-11-25 DIAGNOSIS — M79672 Pain in left foot: Secondary | ICD-10-CM | POA: Diagnosis not present

## 2022-11-25 DIAGNOSIS — M1990 Unspecified osteoarthritis, unspecified site: Secondary | ICD-10-CM | POA: Diagnosis not present

## 2022-11-25 DIAGNOSIS — E1140 Type 2 diabetes mellitus with diabetic neuropathy, unspecified: Secondary | ICD-10-CM | POA: Diagnosis not present

## 2022-12-02 DIAGNOSIS — E1165 Type 2 diabetes mellitus with hyperglycemia: Secondary | ICD-10-CM | POA: Diagnosis not present

## 2022-12-05 DIAGNOSIS — G600 Hereditary motor and sensory neuropathy: Secondary | ICD-10-CM | POA: Diagnosis not present

## 2022-12-05 DIAGNOSIS — M792 Neuralgia and neuritis, unspecified: Secondary | ICD-10-CM | POA: Diagnosis not present

## 2022-12-05 DIAGNOSIS — E1140 Type 2 diabetes mellitus with diabetic neuropathy, unspecified: Secondary | ICD-10-CM | POA: Diagnosis not present

## 2022-12-05 DIAGNOSIS — M79671 Pain in right foot: Secondary | ICD-10-CM | POA: Diagnosis not present

## 2022-12-05 DIAGNOSIS — G5790 Unspecified mononeuropathy of unspecified lower limb: Secondary | ICD-10-CM | POA: Diagnosis not present

## 2022-12-05 DIAGNOSIS — M79672 Pain in left foot: Secondary | ICD-10-CM | POA: Diagnosis not present

## 2022-12-10 DIAGNOSIS — Z299 Encounter for prophylactic measures, unspecified: Secondary | ICD-10-CM | POA: Diagnosis not present

## 2022-12-10 DIAGNOSIS — E1129 Type 2 diabetes mellitus with other diabetic kidney complication: Secondary | ICD-10-CM | POA: Diagnosis not present

## 2022-12-10 DIAGNOSIS — Z6828 Body mass index (BMI) 28.0-28.9, adult: Secondary | ICD-10-CM | POA: Diagnosis not present

## 2022-12-10 DIAGNOSIS — E1165 Type 2 diabetes mellitus with hyperglycemia: Secondary | ICD-10-CM | POA: Diagnosis not present

## 2022-12-10 DIAGNOSIS — I700 Atherosclerosis of aorta: Secondary | ICD-10-CM | POA: Diagnosis not present

## 2022-12-10 DIAGNOSIS — I10 Essential (primary) hypertension: Secondary | ICD-10-CM | POA: Diagnosis not present

## 2022-12-10 DIAGNOSIS — K219 Gastro-esophageal reflux disease without esophagitis: Secondary | ICD-10-CM | POA: Diagnosis not present

## 2022-12-19 DIAGNOSIS — I739 Peripheral vascular disease, unspecified: Secondary | ICD-10-CM | POA: Diagnosis not present

## 2022-12-19 DIAGNOSIS — E1140 Type 2 diabetes mellitus with diabetic neuropathy, unspecified: Secondary | ICD-10-CM | POA: Diagnosis not present

## 2022-12-19 DIAGNOSIS — M79671 Pain in right foot: Secondary | ICD-10-CM | POA: Diagnosis not present

## 2022-12-19 DIAGNOSIS — M79672 Pain in left foot: Secondary | ICD-10-CM | POA: Diagnosis not present

## 2022-12-25 DIAGNOSIS — I10 Essential (primary) hypertension: Secondary | ICD-10-CM | POA: Diagnosis not present

## 2022-12-25 DIAGNOSIS — E1165 Type 2 diabetes mellitus with hyperglycemia: Secondary | ICD-10-CM | POA: Diagnosis not present

## 2022-12-25 DIAGNOSIS — Z6828 Body mass index (BMI) 28.0-28.9, adult: Secondary | ICD-10-CM | POA: Diagnosis not present

## 2022-12-25 DIAGNOSIS — Z299 Encounter for prophylactic measures, unspecified: Secondary | ICD-10-CM | POA: Diagnosis not present

## 2023-01-01 DIAGNOSIS — E1165 Type 2 diabetes mellitus with hyperglycemia: Secondary | ICD-10-CM | POA: Diagnosis not present

## 2023-01-02 DIAGNOSIS — E1140 Type 2 diabetes mellitus with diabetic neuropathy, unspecified: Secondary | ICD-10-CM | POA: Diagnosis not present

## 2023-01-02 DIAGNOSIS — M792 Neuralgia and neuritis, unspecified: Secondary | ICD-10-CM | POA: Diagnosis not present

## 2023-01-02 DIAGNOSIS — M79671 Pain in right foot: Secondary | ICD-10-CM | POA: Diagnosis not present

## 2023-01-02 DIAGNOSIS — M79672 Pain in left foot: Secondary | ICD-10-CM | POA: Diagnosis not present

## 2023-01-06 DIAGNOSIS — E113213 Type 2 diabetes mellitus with mild nonproliferative diabetic retinopathy with macular edema, bilateral: Secondary | ICD-10-CM | POA: Diagnosis not present

## 2023-01-06 DIAGNOSIS — H35033 Hypertensive retinopathy, bilateral: Secondary | ICD-10-CM | POA: Diagnosis not present

## 2023-01-06 DIAGNOSIS — H43813 Vitreous degeneration, bilateral: Secondary | ICD-10-CM | POA: Diagnosis not present

## 2023-01-06 DIAGNOSIS — H353133 Nonexudative age-related macular degeneration, bilateral, advanced atrophic without subfoveal involvement: Secondary | ICD-10-CM | POA: Diagnosis not present

## 2023-01-23 DIAGNOSIS — I10 Essential (primary) hypertension: Secondary | ICD-10-CM | POA: Diagnosis not present

## 2023-01-23 DIAGNOSIS — Z299 Encounter for prophylactic measures, unspecified: Secondary | ICD-10-CM | POA: Diagnosis not present

## 2023-01-23 DIAGNOSIS — R42 Dizziness and giddiness: Secondary | ICD-10-CM | POA: Diagnosis not present

## 2023-01-31 DIAGNOSIS — I6782 Cerebral ischemia: Secondary | ICD-10-CM | POA: Diagnosis not present

## 2023-01-31 DIAGNOSIS — R42 Dizziness and giddiness: Secondary | ICD-10-CM | POA: Diagnosis not present

## 2023-01-31 DIAGNOSIS — G9389 Other specified disorders of brain: Secondary | ICD-10-CM | POA: Diagnosis not present

## 2023-02-01 DIAGNOSIS — R509 Fever, unspecified: Secondary | ICD-10-CM | POA: Diagnosis not present

## 2023-02-05 DIAGNOSIS — J309 Allergic rhinitis, unspecified: Secondary | ICD-10-CM | POA: Diagnosis not present

## 2023-02-05 DIAGNOSIS — J069 Acute upper respiratory infection, unspecified: Secondary | ICD-10-CM | POA: Diagnosis not present

## 2023-02-05 DIAGNOSIS — E1165 Type 2 diabetes mellitus with hyperglycemia: Secondary | ICD-10-CM | POA: Diagnosis not present

## 2023-02-05 DIAGNOSIS — Z299 Encounter for prophylactic measures, unspecified: Secondary | ICD-10-CM | POA: Diagnosis not present

## 2023-02-06 DIAGNOSIS — M79672 Pain in left foot: Secondary | ICD-10-CM | POA: Diagnosis not present

## 2023-02-06 DIAGNOSIS — I739 Peripheral vascular disease, unspecified: Secondary | ICD-10-CM | POA: Diagnosis not present

## 2023-02-06 DIAGNOSIS — M79671 Pain in right foot: Secondary | ICD-10-CM | POA: Diagnosis not present

## 2023-02-06 DIAGNOSIS — G5790 Unspecified mononeuropathy of unspecified lower limb: Secondary | ICD-10-CM | POA: Diagnosis not present

## 2023-02-06 DIAGNOSIS — E1140 Type 2 diabetes mellitus with diabetic neuropathy, unspecified: Secondary | ICD-10-CM | POA: Diagnosis not present

## 2023-02-10 DIAGNOSIS — J111 Influenza due to unidentified influenza virus with other respiratory manifestations: Secondary | ICD-10-CM | POA: Diagnosis not present

## 2023-02-10 DIAGNOSIS — R100 Acute abdomen: Secondary | ICD-10-CM | POA: Diagnosis not present

## 2023-02-10 DIAGNOSIS — R509 Fever, unspecified: Secondary | ICD-10-CM | POA: Diagnosis not present

## 2023-02-10 DIAGNOSIS — Z299 Encounter for prophylactic measures, unspecified: Secondary | ICD-10-CM | POA: Diagnosis not present

## 2023-02-10 DIAGNOSIS — R059 Cough, unspecified: Secondary | ICD-10-CM | POA: Diagnosis not present

## 2023-02-10 DIAGNOSIS — R42 Dizziness and giddiness: Secondary | ICD-10-CM | POA: Diagnosis not present

## 2023-02-27 DIAGNOSIS — E1140 Type 2 diabetes mellitus with diabetic neuropathy, unspecified: Secondary | ICD-10-CM | POA: Diagnosis not present

## 2023-02-27 DIAGNOSIS — M79671 Pain in right foot: Secondary | ICD-10-CM | POA: Diagnosis not present

## 2023-02-27 DIAGNOSIS — M79672 Pain in left foot: Secondary | ICD-10-CM | POA: Diagnosis not present

## 2023-02-27 DIAGNOSIS — I739 Peripheral vascular disease, unspecified: Secondary | ICD-10-CM | POA: Diagnosis not present

## 2023-03-04 DIAGNOSIS — E1165 Type 2 diabetes mellitus with hyperglycemia: Secondary | ICD-10-CM | POA: Diagnosis not present

## 2023-03-06 DIAGNOSIS — I739 Peripheral vascular disease, unspecified: Secondary | ICD-10-CM | POA: Diagnosis not present

## 2023-03-06 DIAGNOSIS — M79671 Pain in right foot: Secondary | ICD-10-CM | POA: Diagnosis not present

## 2023-03-06 DIAGNOSIS — E1140 Type 2 diabetes mellitus with diabetic neuropathy, unspecified: Secondary | ICD-10-CM | POA: Diagnosis not present

## 2023-03-06 DIAGNOSIS — M79672 Pain in left foot: Secondary | ICD-10-CM | POA: Diagnosis not present

## 2023-03-14 DIAGNOSIS — N183 Chronic kidney disease, stage 3 unspecified: Secondary | ICD-10-CM | POA: Diagnosis not present

## 2023-03-14 DIAGNOSIS — Z299 Encounter for prophylactic measures, unspecified: Secondary | ICD-10-CM | POA: Diagnosis not present

## 2023-03-14 DIAGNOSIS — I10 Essential (primary) hypertension: Secondary | ICD-10-CM | POA: Diagnosis not present

## 2023-03-14 DIAGNOSIS — E1122 Type 2 diabetes mellitus with diabetic chronic kidney disease: Secondary | ICD-10-CM | POA: Diagnosis not present

## 2023-03-14 DIAGNOSIS — E1165 Type 2 diabetes mellitus with hyperglycemia: Secondary | ICD-10-CM | POA: Diagnosis not present

## 2023-03-14 DIAGNOSIS — E1142 Type 2 diabetes mellitus with diabetic polyneuropathy: Secondary | ICD-10-CM | POA: Diagnosis not present

## 2023-03-24 DIAGNOSIS — R42 Dizziness and giddiness: Secondary | ICD-10-CM | POA: Diagnosis not present

## 2023-04-03 DIAGNOSIS — I739 Peripheral vascular disease, unspecified: Secondary | ICD-10-CM | POA: Diagnosis not present

## 2023-04-03 DIAGNOSIS — M79672 Pain in left foot: Secondary | ICD-10-CM | POA: Diagnosis not present

## 2023-04-03 DIAGNOSIS — E1140 Type 2 diabetes mellitus with diabetic neuropathy, unspecified: Secondary | ICD-10-CM | POA: Diagnosis not present

## 2023-04-03 DIAGNOSIS — M79671 Pain in right foot: Secondary | ICD-10-CM | POA: Diagnosis not present

## 2023-04-04 DIAGNOSIS — E1165 Type 2 diabetes mellitus with hyperglycemia: Secondary | ICD-10-CM | POA: Diagnosis not present

## 2023-04-07 DIAGNOSIS — H353123 Nonexudative age-related macular degeneration, left eye, advanced atrophic without subfoveal involvement: Secondary | ICD-10-CM | POA: Diagnosis not present

## 2023-04-07 DIAGNOSIS — H353112 Nonexudative age-related macular degeneration, right eye, intermediate dry stage: Secondary | ICD-10-CM | POA: Diagnosis not present

## 2023-04-07 DIAGNOSIS — H16223 Keratoconjunctivitis sicca, not specified as Sjogren's, bilateral: Secondary | ICD-10-CM | POA: Diagnosis not present

## 2023-05-04 DIAGNOSIS — E1165 Type 2 diabetes mellitus with hyperglycemia: Secondary | ICD-10-CM | POA: Diagnosis not present

## 2023-05-09 DIAGNOSIS — R3911 Hesitancy of micturition: Secondary | ICD-10-CM | POA: Diagnosis not present

## 2023-05-09 DIAGNOSIS — Z299 Encounter for prophylactic measures, unspecified: Secondary | ICD-10-CM | POA: Diagnosis not present

## 2023-05-09 DIAGNOSIS — N390 Urinary tract infection, site not specified: Secondary | ICD-10-CM | POA: Diagnosis not present

## 2023-05-15 DIAGNOSIS — M79671 Pain in right foot: Secondary | ICD-10-CM | POA: Diagnosis not present

## 2023-05-15 DIAGNOSIS — M79672 Pain in left foot: Secondary | ICD-10-CM | POA: Diagnosis not present

## 2023-05-15 DIAGNOSIS — I739 Peripheral vascular disease, unspecified: Secondary | ICD-10-CM | POA: Diagnosis not present

## 2023-05-15 DIAGNOSIS — E1140 Type 2 diabetes mellitus with diabetic neuropathy, unspecified: Secondary | ICD-10-CM | POA: Diagnosis not present

## 2023-05-23 DIAGNOSIS — T63441A Toxic effect of venom of bees, accidental (unintentional), initial encounter: Secondary | ICD-10-CM | POA: Diagnosis not present

## 2023-05-23 DIAGNOSIS — I10 Essential (primary) hypertension: Secondary | ICD-10-CM | POA: Diagnosis not present

## 2023-05-23 DIAGNOSIS — E785 Hyperlipidemia, unspecified: Secondary | ICD-10-CM | POA: Diagnosis not present

## 2023-05-23 DIAGNOSIS — E039 Hypothyroidism, unspecified: Secondary | ICD-10-CM | POA: Diagnosis not present

## 2023-05-23 DIAGNOSIS — E119 Type 2 diabetes mellitus without complications: Secondary | ICD-10-CM | POA: Diagnosis not present

## 2023-05-23 DIAGNOSIS — Z7902 Long term (current) use of antithrombotics/antiplatelets: Secondary | ICD-10-CM | POA: Diagnosis not present

## 2023-05-23 DIAGNOSIS — Z79890 Hormone replacement therapy (postmenopausal): Secondary | ICD-10-CM | POA: Diagnosis not present

## 2023-05-23 DIAGNOSIS — Z880 Allergy status to penicillin: Secondary | ICD-10-CM | POA: Diagnosis not present

## 2023-05-23 DIAGNOSIS — Z882 Allergy status to sulfonamides status: Secondary | ICD-10-CM | POA: Diagnosis not present

## 2023-05-23 DIAGNOSIS — Z79899 Other long term (current) drug therapy: Secondary | ICD-10-CM | POA: Diagnosis not present

## 2023-05-23 DIAGNOSIS — F419 Anxiety disorder, unspecified: Secondary | ICD-10-CM | POA: Diagnosis not present

## 2023-05-23 DIAGNOSIS — Z7984 Long term (current) use of oral hypoglycemic drugs: Secondary | ICD-10-CM | POA: Diagnosis not present

## 2023-06-02 DIAGNOSIS — N390 Urinary tract infection, site not specified: Secondary | ICD-10-CM | POA: Diagnosis not present

## 2023-06-02 DIAGNOSIS — Z299 Encounter for prophylactic measures, unspecified: Secondary | ICD-10-CM | POA: Diagnosis not present

## 2023-06-02 DIAGNOSIS — Z7189 Other specified counseling: Secondary | ICD-10-CM | POA: Diagnosis not present

## 2023-06-02 DIAGNOSIS — E7800 Pure hypercholesterolemia, unspecified: Secondary | ICD-10-CM | POA: Diagnosis not present

## 2023-06-02 DIAGNOSIS — Z79899 Other long term (current) drug therapy: Secondary | ICD-10-CM | POA: Diagnosis not present

## 2023-06-02 DIAGNOSIS — R5383 Other fatigue: Secondary | ICD-10-CM | POA: Diagnosis not present

## 2023-06-02 DIAGNOSIS — Z1339 Encounter for screening examination for other mental health and behavioral disorders: Secondary | ICD-10-CM | POA: Diagnosis not present

## 2023-06-02 DIAGNOSIS — Z0000 Encounter for general adult medical examination without abnormal findings: Secondary | ICD-10-CM | POA: Diagnosis not present

## 2023-06-02 DIAGNOSIS — I10 Essential (primary) hypertension: Secondary | ICD-10-CM | POA: Diagnosis not present

## 2023-06-02 DIAGNOSIS — E039 Hypothyroidism, unspecified: Secondary | ICD-10-CM | POA: Diagnosis not present

## 2023-06-02 DIAGNOSIS — Z1331 Encounter for screening for depression: Secondary | ICD-10-CM | POA: Diagnosis not present

## 2023-06-03 DIAGNOSIS — N390 Urinary tract infection, site not specified: Secondary | ICD-10-CM | POA: Diagnosis not present

## 2023-06-04 DIAGNOSIS — E1165 Type 2 diabetes mellitus with hyperglycemia: Secondary | ICD-10-CM | POA: Diagnosis not present

## 2023-06-10 DIAGNOSIS — Z1211 Encounter for screening for malignant neoplasm of colon: Secondary | ICD-10-CM | POA: Diagnosis not present

## 2023-06-10 DIAGNOSIS — Z1212 Encounter for screening for malignant neoplasm of rectum: Secondary | ICD-10-CM | POA: Diagnosis not present

## 2023-06-18 DIAGNOSIS — I10 Essential (primary) hypertension: Secondary | ICD-10-CM | POA: Diagnosis not present

## 2023-06-18 DIAGNOSIS — R350 Frequency of micturition: Secondary | ICD-10-CM | POA: Diagnosis not present

## 2023-06-18 DIAGNOSIS — E1142 Type 2 diabetes mellitus with diabetic polyneuropathy: Secondary | ICD-10-CM | POA: Diagnosis not present

## 2023-06-18 DIAGNOSIS — E1165 Type 2 diabetes mellitus with hyperglycemia: Secondary | ICD-10-CM | POA: Diagnosis not present

## 2023-06-18 DIAGNOSIS — Z299 Encounter for prophylactic measures, unspecified: Secondary | ICD-10-CM | POA: Diagnosis not present

## 2023-06-18 DIAGNOSIS — N183 Chronic kidney disease, stage 3 unspecified: Secondary | ICD-10-CM | POA: Diagnosis not present

## 2023-06-18 DIAGNOSIS — E1122 Type 2 diabetes mellitus with diabetic chronic kidney disease: Secondary | ICD-10-CM | POA: Diagnosis not present

## 2023-06-19 DIAGNOSIS — L570 Actinic keratosis: Secondary | ICD-10-CM | POA: Diagnosis not present

## 2023-06-19 DIAGNOSIS — D485 Neoplasm of uncertain behavior of skin: Secondary | ICD-10-CM | POA: Diagnosis not present

## 2023-06-19 DIAGNOSIS — L988 Other specified disorders of the skin and subcutaneous tissue: Secondary | ICD-10-CM | POA: Diagnosis not present

## 2023-06-19 DIAGNOSIS — D1801 Hemangioma of skin and subcutaneous tissue: Secondary | ICD-10-CM | POA: Diagnosis not present

## 2023-06-19 DIAGNOSIS — L821 Other seborrheic keratosis: Secondary | ICD-10-CM | POA: Diagnosis not present

## 2023-06-19 DIAGNOSIS — D225 Melanocytic nevi of trunk: Secondary | ICD-10-CM | POA: Diagnosis not present

## 2023-06-19 DIAGNOSIS — X32XXXD Exposure to sunlight, subsequent encounter: Secondary | ICD-10-CM | POA: Diagnosis not present

## 2023-07-05 DIAGNOSIS — E1165 Type 2 diabetes mellitus with hyperglycemia: Secondary | ICD-10-CM | POA: Diagnosis not present

## 2023-07-16 DIAGNOSIS — Z299 Encounter for prophylactic measures, unspecified: Secondary | ICD-10-CM | POA: Diagnosis not present

## 2023-07-16 DIAGNOSIS — I10 Essential (primary) hypertension: Secondary | ICD-10-CM | POA: Diagnosis not present

## 2023-07-16 DIAGNOSIS — R350 Frequency of micturition: Secondary | ICD-10-CM | POA: Diagnosis not present

## 2023-07-16 DIAGNOSIS — N390 Urinary tract infection, site not specified: Secondary | ICD-10-CM | POA: Diagnosis not present

## 2023-07-17 DIAGNOSIS — D485 Neoplasm of uncertain behavior of skin: Secondary | ICD-10-CM | POA: Diagnosis not present

## 2023-07-17 DIAGNOSIS — L988 Other specified disorders of the skin and subcutaneous tissue: Secondary | ICD-10-CM | POA: Diagnosis not present

## 2023-07-21 DIAGNOSIS — E1140 Type 2 diabetes mellitus with diabetic neuropathy, unspecified: Secondary | ICD-10-CM | POA: Diagnosis not present

## 2023-07-21 DIAGNOSIS — I739 Peripheral vascular disease, unspecified: Secondary | ICD-10-CM | POA: Diagnosis not present

## 2023-07-21 DIAGNOSIS — M79671 Pain in right foot: Secondary | ICD-10-CM | POA: Diagnosis not present

## 2023-07-21 DIAGNOSIS — M79672 Pain in left foot: Secondary | ICD-10-CM | POA: Diagnosis not present

## 2023-08-04 DIAGNOSIS — Z23 Encounter for immunization: Secondary | ICD-10-CM | POA: Diagnosis not present

## 2023-08-04 DIAGNOSIS — E1165 Type 2 diabetes mellitus with hyperglycemia: Secondary | ICD-10-CM | POA: Diagnosis not present

## 2023-08-11 DIAGNOSIS — H04222 Epiphora due to insufficient drainage, left lacrimal gland: Secondary | ICD-10-CM | POA: Diagnosis not present

## 2023-08-11 DIAGNOSIS — H353112 Nonexudative age-related macular degeneration, right eye, intermediate dry stage: Secondary | ICD-10-CM | POA: Diagnosis not present

## 2023-08-11 DIAGNOSIS — H353123 Nonexudative age-related macular degeneration, left eye, advanced atrophic without subfoveal involvement: Secondary | ICD-10-CM | POA: Diagnosis not present

## 2023-08-15 DIAGNOSIS — Z23 Encounter for immunization: Secondary | ICD-10-CM | POA: Diagnosis not present

## 2023-08-22 DIAGNOSIS — M791 Myalgia, unspecified site: Secondary | ICD-10-CM | POA: Diagnosis not present

## 2023-08-22 DIAGNOSIS — R5383 Other fatigue: Secondary | ICD-10-CM | POA: Diagnosis not present

## 2023-08-22 DIAGNOSIS — Z299 Encounter for prophylactic measures, unspecified: Secondary | ICD-10-CM | POA: Diagnosis not present

## 2023-08-22 DIAGNOSIS — R52 Pain, unspecified: Secondary | ICD-10-CM | POA: Diagnosis not present

## 2023-08-22 DIAGNOSIS — T881XXA Other complications following immunization, not elsewhere classified, initial encounter: Secondary | ICD-10-CM | POA: Diagnosis not present

## 2023-08-25 DIAGNOSIS — E1129 Type 2 diabetes mellitus with other diabetic kidney complication: Secondary | ICD-10-CM | POA: Diagnosis not present

## 2023-08-25 DIAGNOSIS — M7918 Myalgia, other site: Secondary | ICD-10-CM | POA: Diagnosis not present

## 2023-08-25 DIAGNOSIS — R52 Pain, unspecified: Secondary | ICD-10-CM | POA: Diagnosis not present

## 2023-08-25 DIAGNOSIS — Z299 Encounter for prophylactic measures, unspecified: Secondary | ICD-10-CM | POA: Diagnosis not present

## 2023-09-03 DIAGNOSIS — E1165 Type 2 diabetes mellitus with hyperglycemia: Secondary | ICD-10-CM | POA: Diagnosis not present

## 2023-09-11 DIAGNOSIS — Z79890 Hormone replacement therapy (postmenopausal): Secondary | ICD-10-CM | POA: Diagnosis not present

## 2023-09-11 DIAGNOSIS — M7651 Patellar tendinitis, right knee: Secondary | ICD-10-CM | POA: Diagnosis not present

## 2023-09-11 DIAGNOSIS — Z880 Allergy status to penicillin: Secondary | ICD-10-CM | POA: Diagnosis not present

## 2023-09-11 DIAGNOSIS — M1711 Unilateral primary osteoarthritis, right knee: Secondary | ICD-10-CM | POA: Diagnosis not present

## 2023-09-11 DIAGNOSIS — E119 Type 2 diabetes mellitus without complications: Secondary | ICD-10-CM | POA: Diagnosis not present

## 2023-09-11 DIAGNOSIS — S8001XA Contusion of right knee, initial encounter: Secondary | ICD-10-CM | POA: Diagnosis not present

## 2023-09-11 DIAGNOSIS — Z882 Allergy status to sulfonamides status: Secondary | ICD-10-CM | POA: Diagnosis not present

## 2023-09-11 DIAGNOSIS — Z90710 Acquired absence of both cervix and uterus: Secondary | ICD-10-CM | POA: Diagnosis not present

## 2023-09-11 DIAGNOSIS — M25561 Pain in right knee: Secondary | ICD-10-CM | POA: Diagnosis not present

## 2023-09-11 DIAGNOSIS — Z7984 Long term (current) use of oral hypoglycemic drugs: Secondary | ICD-10-CM | POA: Diagnosis not present

## 2023-09-11 DIAGNOSIS — E785 Hyperlipidemia, unspecified: Secondary | ICD-10-CM | POA: Diagnosis not present

## 2023-09-11 DIAGNOSIS — M25461 Effusion, right knee: Secondary | ICD-10-CM | POA: Diagnosis not present

## 2023-09-11 DIAGNOSIS — Z5329 Procedure and treatment not carried out because of patient's decision for other reasons: Secondary | ICD-10-CM | POA: Diagnosis not present

## 2023-09-11 DIAGNOSIS — I10 Essential (primary) hypertension: Secondary | ICD-10-CM | POA: Diagnosis not present

## 2023-09-11 DIAGNOSIS — W010XXA Fall on same level from slipping, tripping and stumbling without subsequent striking against object, initial encounter: Secondary | ICD-10-CM | POA: Diagnosis not present

## 2023-09-12 DIAGNOSIS — R52 Pain, unspecified: Secondary | ICD-10-CM | POA: Diagnosis not present

## 2023-09-12 DIAGNOSIS — E1169 Type 2 diabetes mellitus with other specified complication: Secondary | ICD-10-CM | POA: Diagnosis not present

## 2023-09-12 DIAGNOSIS — M25569 Pain in unspecified knee: Secondary | ICD-10-CM | POA: Diagnosis not present

## 2023-09-12 DIAGNOSIS — Z299 Encounter for prophylactic measures, unspecified: Secondary | ICD-10-CM | POA: Diagnosis not present

## 2023-09-12 DIAGNOSIS — I10 Essential (primary) hypertension: Secondary | ICD-10-CM | POA: Diagnosis not present

## 2023-09-12 DIAGNOSIS — M542 Cervicalgia: Secondary | ICD-10-CM | POA: Diagnosis not present

## 2023-09-12 DIAGNOSIS — M25561 Pain in right knee: Secondary | ICD-10-CM | POA: Diagnosis not present

## 2023-09-16 ENCOUNTER — Other Ambulatory Visit: Payer: Self-pay | Admitting: Nurse Practitioner

## 2023-09-16 DIAGNOSIS — Z1231 Encounter for screening mammogram for malignant neoplasm of breast: Secondary | ICD-10-CM

## 2023-09-18 DIAGNOSIS — S82121A Displaced fracture of lateral condyle of right tibia, initial encounter for closed fracture: Secondary | ICD-10-CM | POA: Diagnosis not present

## 2023-09-18 DIAGNOSIS — M25461 Effusion, right knee: Secondary | ICD-10-CM | POA: Diagnosis not present

## 2023-09-18 DIAGNOSIS — M1711 Unilateral primary osteoarthritis, right knee: Secondary | ICD-10-CM | POA: Diagnosis not present

## 2023-09-18 DIAGNOSIS — S82141A Displaced bicondylar fracture of right tibia, initial encounter for closed fracture: Secondary | ICD-10-CM | POA: Diagnosis not present

## 2023-09-25 DIAGNOSIS — I10 Essential (primary) hypertension: Secondary | ICD-10-CM | POA: Diagnosis not present

## 2023-09-25 DIAGNOSIS — F419 Anxiety disorder, unspecified: Secondary | ICD-10-CM | POA: Diagnosis not present

## 2023-09-25 DIAGNOSIS — E1165 Type 2 diabetes mellitus with hyperglycemia: Secondary | ICD-10-CM | POA: Diagnosis not present

## 2023-09-25 DIAGNOSIS — Z299 Encounter for prophylactic measures, unspecified: Secondary | ICD-10-CM | POA: Diagnosis not present

## 2023-09-29 DIAGNOSIS — M79671 Pain in right foot: Secondary | ICD-10-CM | POA: Diagnosis not present

## 2023-09-29 DIAGNOSIS — I739 Peripheral vascular disease, unspecified: Secondary | ICD-10-CM | POA: Diagnosis not present

## 2023-09-29 DIAGNOSIS — M79672 Pain in left foot: Secondary | ICD-10-CM | POA: Diagnosis not present

## 2023-09-29 DIAGNOSIS — E1140 Type 2 diabetes mellitus with diabetic neuropathy, unspecified: Secondary | ICD-10-CM | POA: Diagnosis not present

## 2023-10-03 DIAGNOSIS — E1165 Type 2 diabetes mellitus with hyperglycemia: Secondary | ICD-10-CM | POA: Diagnosis not present

## 2023-10-08 ENCOUNTER — Ambulatory Visit
Admission: RE | Admit: 2023-10-08 | Discharge: 2023-10-08 | Disposition: A | Discharge status: Home / Self Care | Payer: Medicare Other | Source: Ambulatory Visit | Attending: Nurse Practitioner | Admitting: Nurse Practitioner

## 2023-10-08 DIAGNOSIS — Z1231 Encounter for screening mammogram for malignant neoplasm of breast: Secondary | ICD-10-CM

## 2023-10-23 DIAGNOSIS — Z299 Encounter for prophylactic measures, unspecified: Secondary | ICD-10-CM | POA: Diagnosis not present

## 2023-10-23 DIAGNOSIS — F419 Anxiety disorder, unspecified: Secondary | ICD-10-CM | POA: Diagnosis not present

## 2023-10-23 DIAGNOSIS — I10 Essential (primary) hypertension: Secondary | ICD-10-CM | POA: Diagnosis not present

## 2023-10-23 DIAGNOSIS — F331 Major depressive disorder, recurrent, moderate: Secondary | ICD-10-CM | POA: Diagnosis not present

## 2023-11-03 DIAGNOSIS — E1165 Type 2 diabetes mellitus with hyperglycemia: Secondary | ICD-10-CM | POA: Diagnosis not present

## 2023-11-20 DIAGNOSIS — Z299 Encounter for prophylactic measures, unspecified: Secondary | ICD-10-CM | POA: Diagnosis not present

## 2023-11-20 DIAGNOSIS — I10 Essential (primary) hypertension: Secondary | ICD-10-CM | POA: Diagnosis not present

## 2023-11-20 DIAGNOSIS — F419 Anxiety disorder, unspecified: Secondary | ICD-10-CM | POA: Diagnosis not present

## 2023-11-20 DIAGNOSIS — R8290 Unspecified abnormal findings in urine: Secondary | ICD-10-CM | POA: Diagnosis not present

## 2023-11-20 DIAGNOSIS — E1122 Type 2 diabetes mellitus with diabetic chronic kidney disease: Secondary | ICD-10-CM | POA: Diagnosis not present

## 2023-12-22 DIAGNOSIS — R8290 Unspecified abnormal findings in urine: Secondary | ICD-10-CM | POA: Diagnosis not present

## 2023-12-22 DIAGNOSIS — K611 Rectal abscess: Secondary | ICD-10-CM | POA: Diagnosis not present

## 2023-12-22 DIAGNOSIS — K625 Hemorrhage of anus and rectum: Secondary | ICD-10-CM | POA: Diagnosis not present

## 2023-12-22 DIAGNOSIS — R161 Splenomegaly, not elsewhere classified: Secondary | ICD-10-CM | POA: Diagnosis not present

## 2023-12-22 DIAGNOSIS — R935 Abnormal findings on diagnostic imaging of other abdominal regions, including retroperitoneum: Secondary | ICD-10-CM | POA: Diagnosis not present

## 2023-12-22 DIAGNOSIS — K6289 Other specified diseases of anus and rectum: Secondary | ICD-10-CM | POA: Diagnosis not present

## 2023-12-22 DIAGNOSIS — L708 Other acne: Secondary | ICD-10-CM | POA: Diagnosis not present

## 2023-12-22 DIAGNOSIS — Z299 Encounter for prophylactic measures, unspecified: Secondary | ICD-10-CM | POA: Diagnosis not present

## 2023-12-22 DIAGNOSIS — I10 Essential (primary) hypertension: Secondary | ICD-10-CM | POA: Diagnosis not present

## 2023-12-23 DIAGNOSIS — I10 Essential (primary) hypertension: Secondary | ICD-10-CM | POA: Diagnosis not present

## 2023-12-23 DIAGNOSIS — Z299 Encounter for prophylactic measures, unspecified: Secondary | ICD-10-CM | POA: Diagnosis not present

## 2023-12-23 DIAGNOSIS — L708 Other acne: Secondary | ICD-10-CM | POA: Diagnosis not present

## 2023-12-23 DIAGNOSIS — K611 Rectal abscess: Secondary | ICD-10-CM | POA: Diagnosis not present

## 2023-12-26 DIAGNOSIS — A498 Other bacterial infections of unspecified site: Secondary | ICD-10-CM | POA: Diagnosis not present

## 2023-12-26 DIAGNOSIS — Z299 Encounter for prophylactic measures, unspecified: Secondary | ICD-10-CM | POA: Diagnosis not present

## 2023-12-26 DIAGNOSIS — M62830 Muscle spasm of back: Secondary | ICD-10-CM | POA: Diagnosis not present

## 2023-12-26 DIAGNOSIS — I10 Essential (primary) hypertension: Secondary | ICD-10-CM | POA: Diagnosis not present

## 2024-01-02 DIAGNOSIS — E1165 Type 2 diabetes mellitus with hyperglycemia: Secondary | ICD-10-CM | POA: Diagnosis not present

## 2024-01-08 DIAGNOSIS — E1169 Type 2 diabetes mellitus with other specified complication: Secondary | ICD-10-CM | POA: Diagnosis not present

## 2024-01-08 DIAGNOSIS — N183 Chronic kidney disease, stage 3 unspecified: Secondary | ICD-10-CM | POA: Diagnosis not present

## 2024-01-08 DIAGNOSIS — I10 Essential (primary) hypertension: Secondary | ICD-10-CM | POA: Diagnosis not present

## 2024-01-08 DIAGNOSIS — Z299 Encounter for prophylactic measures, unspecified: Secondary | ICD-10-CM | POA: Diagnosis not present

## 2024-01-08 DIAGNOSIS — E1122 Type 2 diabetes mellitus with diabetic chronic kidney disease: Secondary | ICD-10-CM | POA: Diagnosis not present

## 2024-02-01 DIAGNOSIS — E1165 Type 2 diabetes mellitus with hyperglycemia: Secondary | ICD-10-CM | POA: Diagnosis not present

## 2024-03-03 DIAGNOSIS — E1165 Type 2 diabetes mellitus with hyperglycemia: Secondary | ICD-10-CM | POA: Diagnosis not present

## 2024-04-03 DIAGNOSIS — E1165 Type 2 diabetes mellitus with hyperglycemia: Secondary | ICD-10-CM | POA: Diagnosis not present

## 2024-04-22 DIAGNOSIS — Z299 Encounter for prophylactic measures, unspecified: Secondary | ICD-10-CM | POA: Diagnosis not present

## 2024-04-22 DIAGNOSIS — R208 Other disturbances of skin sensation: Secondary | ICD-10-CM | POA: Diagnosis not present

## 2024-04-22 DIAGNOSIS — J302 Other seasonal allergic rhinitis: Secondary | ICD-10-CM | POA: Diagnosis not present

## 2024-04-22 DIAGNOSIS — I10 Essential (primary) hypertension: Secondary | ICD-10-CM | POA: Diagnosis not present

## 2024-05-03 DIAGNOSIS — E1165 Type 2 diabetes mellitus with hyperglycemia: Secondary | ICD-10-CM | POA: Diagnosis not present

## 2024-05-06 DIAGNOSIS — I10 Essential (primary) hypertension: Secondary | ICD-10-CM | POA: Diagnosis not present

## 2024-05-06 DIAGNOSIS — Z299 Encounter for prophylactic measures, unspecified: Secondary | ICD-10-CM | POA: Diagnosis not present

## 2024-05-06 DIAGNOSIS — N183 Chronic kidney disease, stage 3 unspecified: Secondary | ICD-10-CM | POA: Diagnosis not present

## 2024-05-06 DIAGNOSIS — E1165 Type 2 diabetes mellitus with hyperglycemia: Secondary | ICD-10-CM | POA: Diagnosis not present

## 2024-05-06 DIAGNOSIS — L0501 Pilonidal cyst with abscess: Secondary | ICD-10-CM | POA: Diagnosis not present

## 2024-05-20 DIAGNOSIS — N909 Noninflammatory disorder of vulva and perineum, unspecified: Secondary | ICD-10-CM | POA: Diagnosis not present

## 2024-06-02 DIAGNOSIS — E7800 Pure hypercholesterolemia, unspecified: Secondary | ICD-10-CM | POA: Diagnosis not present

## 2024-06-02 DIAGNOSIS — R5383 Other fatigue: Secondary | ICD-10-CM | POA: Diagnosis not present

## 2024-06-02 DIAGNOSIS — Z1331 Encounter for screening for depression: Secondary | ICD-10-CM | POA: Diagnosis not present

## 2024-06-02 DIAGNOSIS — Z299 Encounter for prophylactic measures, unspecified: Secondary | ICD-10-CM | POA: Diagnosis not present

## 2024-06-02 DIAGNOSIS — Z79899 Other long term (current) drug therapy: Secondary | ICD-10-CM | POA: Diagnosis not present

## 2024-06-02 DIAGNOSIS — Z7189 Other specified counseling: Secondary | ICD-10-CM | POA: Diagnosis not present

## 2024-06-02 DIAGNOSIS — Z0000 Encounter for general adult medical examination without abnormal findings: Secondary | ICD-10-CM | POA: Diagnosis not present

## 2024-06-02 DIAGNOSIS — E039 Hypothyroidism, unspecified: Secondary | ICD-10-CM | POA: Diagnosis not present

## 2024-06-02 DIAGNOSIS — I10 Essential (primary) hypertension: Secondary | ICD-10-CM | POA: Diagnosis not present

## 2024-06-02 DIAGNOSIS — F331 Major depressive disorder, recurrent, moderate: Secondary | ICD-10-CM | POA: Diagnosis not present

## 2024-06-02 DIAGNOSIS — Z1339 Encounter for screening examination for other mental health and behavioral disorders: Secondary | ICD-10-CM | POA: Diagnosis not present

## 2024-06-03 DIAGNOSIS — E1165 Type 2 diabetes mellitus with hyperglycemia: Secondary | ICD-10-CM | POA: Diagnosis not present

## 2024-06-03 DIAGNOSIS — M2489 Other specific joint derangement of other specified joint, not elsewhere classified: Secondary | ICD-10-CM | POA: Diagnosis not present

## 2024-06-03 DIAGNOSIS — M542 Cervicalgia: Secondary | ICD-10-CM | POA: Diagnosis not present

## 2024-06-03 DIAGNOSIS — M4802 Spinal stenosis, cervical region: Secondary | ICD-10-CM | POA: Diagnosis not present

## 2024-06-03 DIAGNOSIS — M47812 Spondylosis without myelopathy or radiculopathy, cervical region: Secondary | ICD-10-CM | POA: Diagnosis not present

## 2024-06-07 DIAGNOSIS — E8940 Asymptomatic postprocedural ovarian failure: Secondary | ICD-10-CM | POA: Diagnosis not present

## 2024-06-14 DIAGNOSIS — Z299 Encounter for prophylactic measures, unspecified: Secondary | ICD-10-CM | POA: Diagnosis not present

## 2024-06-14 DIAGNOSIS — E1122 Type 2 diabetes mellitus with diabetic chronic kidney disease: Secondary | ICD-10-CM | POA: Diagnosis not present

## 2024-06-14 DIAGNOSIS — R52 Pain, unspecified: Secondary | ICD-10-CM | POA: Diagnosis not present

## 2024-06-14 DIAGNOSIS — L0501 Pilonidal cyst with abscess: Secondary | ICD-10-CM | POA: Diagnosis not present

## 2024-06-14 DIAGNOSIS — F419 Anxiety disorder, unspecified: Secondary | ICD-10-CM | POA: Diagnosis not present

## 2024-06-14 DIAGNOSIS — I10 Essential (primary) hypertension: Secondary | ICD-10-CM | POA: Diagnosis not present

## 2024-06-15 DIAGNOSIS — N907 Vulvar cyst: Secondary | ICD-10-CM | POA: Diagnosis not present

## 2024-06-24 DIAGNOSIS — D485 Neoplasm of uncertain behavior of skin: Secondary | ICD-10-CM | POA: Diagnosis not present

## 2024-06-24 DIAGNOSIS — D487 Neoplasm of uncertain behavior of other specified sites: Secondary | ICD-10-CM | POA: Diagnosis not present

## 2024-06-24 DIAGNOSIS — L720 Epidermal cyst: Secondary | ICD-10-CM | POA: Diagnosis not present

## 2024-06-24 DIAGNOSIS — D225 Melanocytic nevi of trunk: Secondary | ICD-10-CM | POA: Diagnosis not present

## 2024-06-24 DIAGNOSIS — Z1283 Encounter for screening for malignant neoplasm of skin: Secondary | ICD-10-CM | POA: Diagnosis not present

## 2024-07-03 DIAGNOSIS — E1165 Type 2 diabetes mellitus with hyperglycemia: Secondary | ICD-10-CM | POA: Diagnosis not present

## 2024-08-02 DIAGNOSIS — H353112 Nonexudative age-related macular degeneration, right eye, intermediate dry stage: Secondary | ICD-10-CM | POA: Diagnosis not present

## 2024-08-02 DIAGNOSIS — H04222 Epiphora due to insufficient drainage, left lacrimal gland: Secondary | ICD-10-CM | POA: Diagnosis not present

## 2024-08-02 DIAGNOSIS — H5212 Myopia, left eye: Secondary | ICD-10-CM | POA: Diagnosis not present

## 2024-08-02 DIAGNOSIS — H353124 Nonexudative age-related macular degeneration, left eye, advanced atrophic with subfoveal involvement: Secondary | ICD-10-CM | POA: Diagnosis not present

## 2024-08-03 DIAGNOSIS — E1165 Type 2 diabetes mellitus with hyperglycemia: Secondary | ICD-10-CM | POA: Diagnosis not present

## 2024-08-09 DIAGNOSIS — E1142 Type 2 diabetes mellitus with diabetic polyneuropathy: Secondary | ICD-10-CM | POA: Diagnosis not present

## 2024-08-09 DIAGNOSIS — I10 Essential (primary) hypertension: Secondary | ICD-10-CM | POA: Diagnosis not present

## 2024-08-09 DIAGNOSIS — Z299 Encounter for prophylactic measures, unspecified: Secondary | ICD-10-CM | POA: Diagnosis not present

## 2024-08-09 DIAGNOSIS — H353290 Exudative age-related macular degeneration, unspecified eye, stage unspecified: Secondary | ICD-10-CM | POA: Diagnosis not present

## 2024-08-09 DIAGNOSIS — M4802 Spinal stenosis, cervical region: Secondary | ICD-10-CM | POA: Diagnosis not present

## 2024-08-09 DIAGNOSIS — E119 Type 2 diabetes mellitus without complications: Secondary | ICD-10-CM | POA: Diagnosis not present

## 2024-08-18 ENCOUNTER — Encounter (INDEPENDENT_AMBULATORY_CARE_PROVIDER_SITE_OTHER): Payer: Self-pay | Admitting: Gastroenterology

## 2024-10-16 ENCOUNTER — Other Ambulatory Visit (HOSPITAL_COMMUNITY): Payer: Self-pay

## 2024-10-16 MED ORDER — LOVASTATIN 40 MG PO TABS: 40.0000 mg | ORAL_TABLET | Freq: Every day | ORAL | 3 refills | Status: AC

## 2024-10-16 MED ORDER — LEVOTHYROXINE SODIUM 100 MCG PO TABS: 100.0000 ug | ORAL_TABLET | Freq: Every day | ORAL | 2 refills | Status: AC

## 2024-10-16 MED ORDER — BACLOFEN 10 MG PO TABS: 10.0000 mg | ORAL_TABLET | Freq: Two times a day (BID) | ORAL | 4 refills | Status: AC | PRN

## 2024-10-16 MED ORDER — LEVOCETIRIZINE DIHYDROCHLORIDE 5 MG PO TABS: 5.0000 mg | ORAL_TABLET | Freq: Every day | ORAL | 3 refills | Status: AC

## 2024-10-16 MED ORDER — FLUCONAZOLE 200 MG PO TABS: 200.0000 mg | ORAL_TABLET | Freq: Every day | ORAL | 1 refills | Status: AC

## 2024-10-16 MED ORDER — INSULIN PEN NEEDLE 31G X 8 MM MISC: 3.0000 | 3 refills | Status: AC

## 2024-10-16 MED ORDER — AMLODIPINE BESYLATE 10 MG PO TABS: 10.0000 mg | ORAL_TABLET | Freq: Every day | ORAL | 1 refills | Status: AC

## 2024-10-16 MED ORDER — NYSTATIN 100000 UNIT/ML MT SUSP: 5.0000 mL | Freq: Three times a day (TID) | OROMUCOSAL | 1 refills | Status: AC

## 2024-10-16 MED ORDER — BUSPIRONE HCL 10 MG PO TABS: ORAL_TABLET | ORAL | 3 refills | Status: AC

## 2024-10-16 MED ORDER — MUPIROCIN 2 % EX OINT: TOPICAL_OINTMENT | Freq: Three times a day (TID) | CUTANEOUS | 1 refills | Status: AC

## 2024-10-17 ENCOUNTER — Other Ambulatory Visit (HOSPITAL_COMMUNITY): Payer: Self-pay

## 2024-10-18 ENCOUNTER — Other Ambulatory Visit (HOSPITAL_BASED_OUTPATIENT_CLINIC_OR_DEPARTMENT_OTHER): Payer: Self-pay

## 2024-10-18 MED ORDER — ACYCLOVIR 400 MG PO TABS
400.0000 mg | ORAL_TABLET | Freq: Two times a day (BID) | ORAL | 3 refills | Status: AC
  Filled 2024-10-18 – 2024-10-22 (×2): qty 180, 90d supply, fill #0

## 2024-10-18 MED ORDER — PANTOPRAZOLE SODIUM 40 MG PO TBEC
40.0000 mg | DELAYED_RELEASE_TABLET | Freq: Every day | ORAL | 1 refills | Status: AC
  Filled 2024-10-18 – 2024-10-22 (×2): qty 90, 90d supply, fill #0

## 2024-10-18 MED ORDER — LOVASTATIN 40 MG PO TABS
40.0000 mg | ORAL_TABLET | Freq: Every day | ORAL | 3 refills | Status: AC
  Filled 2024-10-18 – 2024-10-22 (×2): qty 60, 60d supply, fill #0

## 2024-10-18 MED ORDER — SERTRALINE HCL 100 MG PO TABS
100.0000 mg | ORAL_TABLET | Freq: Two times a day (BID) | ORAL | 1 refills | Status: AC
  Filled 2024-10-18 – 2024-10-22 (×2): qty 180, 90d supply, fill #0

## 2024-10-18 MED ORDER — GLIPIZIDE 10 MG PO TABS
10.0000 mg | ORAL_TABLET | Freq: Every day | ORAL | 1 refills | Status: AC
  Filled 2024-10-18 – 2024-10-22 (×2): qty 90, 90d supply, fill #0

## 2024-10-18 MED ORDER — AMLODIPINE BESYLATE 10 MG PO TABS
10.0000 mg | ORAL_TABLET | Freq: Every day | ORAL | 1 refills | Status: AC
  Filled 2024-10-18 – 2024-10-22 (×2): qty 90, 90d supply, fill #0

## 2024-10-18 MED ORDER — INSULIN DEGLUDEC 100 UNIT/ML ~~LOC~~ SOPN
50.0000 [IU] | PEN_INJECTOR | Freq: Every day | SUBCUTANEOUS | 2 refills | Status: AC
  Filled 2024-10-18 – 2024-10-22 (×2): qty 60, 100d supply, fill #0

## 2024-10-18 MED ORDER — BUSPIRONE HCL 10 MG PO TABS
ORAL_TABLET | ORAL | 3 refills | Status: AC
  Filled 2024-10-18 – 2024-10-22 (×2): qty 90, 30d supply, fill #0

## 2024-10-18 MED ORDER — GEMFIBROZIL 600 MG PO TABS
600.0000 mg | ORAL_TABLET | Freq: Two times a day (BID) | ORAL | 3 refills | Status: AC
  Filled 2024-10-18: qty 60, 30d supply, fill #0
  Filled 2024-10-19: qty 120, 60d supply, fill #0
  Filled 2024-10-22: qty 180, 90d supply, fill #0

## 2024-10-18 MED ORDER — HYDROCHLOROTHIAZIDE 25 MG PO TABS
25.0000 mg | ORAL_TABLET | Freq: Every day | ORAL | 1 refills | Status: AC
  Filled 2024-10-18 – 2024-10-22 (×2): qty 90, 90d supply, fill #0

## 2024-10-18 MED ORDER — LEVOTHYROXINE SODIUM 100 MCG PO TABS
100.0000 ug | ORAL_TABLET | Freq: Every day | ORAL | 2 refills | Status: AC
  Filled 2024-10-18 – 2024-10-22 (×2): qty 90, 90d supply, fill #0

## 2024-10-19 ENCOUNTER — Other Ambulatory Visit (HOSPITAL_BASED_OUTPATIENT_CLINIC_OR_DEPARTMENT_OTHER): Payer: Self-pay

## 2024-10-19 ENCOUNTER — Other Ambulatory Visit (HOSPITAL_COMMUNITY): Payer: Self-pay

## 2024-10-22 ENCOUNTER — Other Ambulatory Visit (HOSPITAL_COMMUNITY): Payer: Self-pay

## 2024-10-22 ENCOUNTER — Other Ambulatory Visit: Payer: Self-pay

## 2024-10-22 ENCOUNTER — Other Ambulatory Visit (HOSPITAL_BASED_OUTPATIENT_CLINIC_OR_DEPARTMENT_OTHER): Payer: Self-pay

## 2024-11-10 ENCOUNTER — Other Ambulatory Visit (HOSPITAL_BASED_OUTPATIENT_CLINIC_OR_DEPARTMENT_OTHER): Payer: Self-pay

## 2024-11-10 MED ORDER — MECLIZINE HCL 25 MG PO TABS
25.0000 mg | ORAL_TABLET | Freq: Three times a day (TID) | ORAL | 3 refills | Status: AC | PRN
  Filled 2024-11-10: qty 21, 7d supply, fill #0

## 2024-11-10 MED ORDER — AZITHROMYCIN 500 MG PO TABS
500.0000 mg | ORAL_TABLET | Freq: Every day | ORAL | 0 refills | Status: AC
  Filled 2024-11-10: qty 5, 5d supply, fill #0

## 2024-11-22 ENCOUNTER — Other Ambulatory Visit (HOSPITAL_BASED_OUTPATIENT_CLINIC_OR_DEPARTMENT_OTHER): Payer: Self-pay

## 2024-11-23 ENCOUNTER — Other Ambulatory Visit (HOSPITAL_BASED_OUTPATIENT_CLINIC_OR_DEPARTMENT_OTHER): Payer: Self-pay
